# Patient Record
Sex: Female | Born: 1985
Health system: Southern US, Community
[De-identification: ages and names within clinical notes are randomized; demographics above are authoritative.]

## PROBLEM LIST (undated history)

## (undated) ENCOUNTER — Inpatient Hospital Stay (HOSPITAL_COMMUNITY): Payer: Self-pay

## (undated) DIAGNOSIS — N926 Irregular menstruation, unspecified: Secondary | ICD-10-CM

## (undated) DIAGNOSIS — E785 Hyperlipidemia, unspecified: Secondary | ICD-10-CM

## (undated) DIAGNOSIS — I499 Cardiac arrhythmia, unspecified: Secondary | ICD-10-CM

## (undated) DIAGNOSIS — E119 Type 2 diabetes mellitus without complications: Secondary | ICD-10-CM

## (undated) DIAGNOSIS — N611 Abscess of the breast and nipple: Secondary | ICD-10-CM

## (undated) HISTORY — DX: Abscess of the breast and nipple: N61.1

## (undated) HISTORY — DX: Type 2 diabetes mellitus without complications: E11.9

## (undated) HISTORY — DX: Cardiac arrhythmia, unspecified: I49.9

## (undated) HISTORY — PX: SURGERY OF LIP: SUR1315

## (undated) HISTORY — PX: BREAST SURGERY: SHX581

## (undated) HISTORY — DX: Hyperlipidemia, unspecified: E78.5

## (undated) HISTORY — DX: Irregular menstruation, unspecified: N92.6

---
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
## 2006-09-15 ENCOUNTER — Other Ambulatory Visit: Admission: RE | Admit: 2006-09-15 | Discharge: 2006-09-15 | Payer: Self-pay | Admitting: Unknown Physician Specialty

## 2006-09-15 ENCOUNTER — Encounter (INDEPENDENT_AMBULATORY_CARE_PROVIDER_SITE_OTHER): Payer: Self-pay | Admitting: Unknown Physician Specialty

## 2007-12-28 ENCOUNTER — Other Ambulatory Visit: Admission: RE | Admit: 2007-12-28 | Discharge: 2007-12-28 | Payer: Self-pay | Admitting: Nurse Practitioner

## 2007-12-28 ENCOUNTER — Other Ambulatory Visit: Admission: RE | Admit: 2007-12-28 | Discharge: 2007-12-28 | Payer: Self-pay | Admitting: Family Medicine

## 2008-11-28 ENCOUNTER — Encounter (INDEPENDENT_AMBULATORY_CARE_PROVIDER_SITE_OTHER): Payer: Self-pay | Admitting: Unknown Physician Specialty

## 2008-11-28 ENCOUNTER — Other Ambulatory Visit: Admission: RE | Admit: 2008-11-28 | Discharge: 2008-11-28 | Payer: Self-pay | Admitting: Unknown Physician Specialty

## 2012-08-27 ENCOUNTER — Emergency Department (HOSPITAL_COMMUNITY): Payer: No Typology Code available for payment source

## 2012-08-27 ENCOUNTER — Encounter (HOSPITAL_COMMUNITY): Payer: Self-pay | Admitting: Emergency Medicine

## 2012-08-27 ENCOUNTER — Emergency Department (HOSPITAL_COMMUNITY)
Admission: EM | Admit: 2012-08-27 | Discharge: 2012-08-27 | Disposition: A | Discharge status: Home / Self Care | Payer: No Typology Code available for payment source | Attending: Emergency Medicine | Admitting: Emergency Medicine

## 2012-08-27 DIAGNOSIS — S335XXA Sprain of ligaments of lumbar spine, initial encounter: Secondary | ICD-10-CM | POA: Insufficient documentation

## 2012-08-27 DIAGNOSIS — S139XXA Sprain of joints and ligaments of unspecified parts of neck, initial encounter: Secondary | ICD-10-CM | POA: Insufficient documentation

## 2012-08-27 DIAGNOSIS — Y9389 Activity, other specified: Secondary | ICD-10-CM | POA: Insufficient documentation

## 2012-08-27 DIAGNOSIS — S39012A Strain of muscle, fascia and tendon of lower back, initial encounter: Secondary | ICD-10-CM

## 2012-08-27 DIAGNOSIS — Y92410 Unspecified street and highway as the place of occurrence of the external cause: Secondary | ICD-10-CM | POA: Insufficient documentation

## 2012-08-27 DIAGNOSIS — S161XXA Strain of muscle, fascia and tendon at neck level, initial encounter: Secondary | ICD-10-CM

## 2012-08-27 MED ORDER — NAPROXEN 500 MG PO TABS: ORAL_TABLET | ORAL | Status: DC
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
## 2013-07-04 ENCOUNTER — Ambulatory Visit (INDEPENDENT_AMBULATORY_CARE_PROVIDER_SITE_OTHER): Payer: BC Managed Care – PPO | Admitting: Adult Health

## 2013-07-04 ENCOUNTER — Encounter: Payer: Self-pay | Admitting: Adult Health

## 2013-07-04 VITALS — BP 120/72 | Ht 62.0 in | Wt 186.5 lb

## 2013-07-04 DIAGNOSIS — L02229 Furuncle of trunk, unspecified: Secondary | ICD-10-CM

## 2013-07-04 DIAGNOSIS — N611 Abscess of the breast and nipple: Secondary | ICD-10-CM

## 2013-07-04 HISTORY — DX: Abscess of the breast and nipple: N61.1

## 2013-07-04 MED ORDER — SULFAMETHOXAZOLE-TMP DS 800-160 MG PO TABS: 1.0000 | ORAL_TABLET | Freq: Two times a day (BID) | ORAL | Status: DC

## 2013-07-04 NOTE — Progress Notes (Signed)
Subjective:     Patient ID: Jessica Carey, female   DOB: 09/27/1985, 28 y.o.   MRN: [id number]  HPI Latrell is a 28 year old Hispanic female in complaining of knot under right breast x 2 weeks, used ice is better, she says she gets boils often in different areas.Has history of irregular periods, used to take OCs to control but off those about 2 years, was seen at health dept in past.  Review of Systems See HPI Reviewed past medical,surgical, social and family history. Reviewed medications and allergies.     Objective:   Physical Exam BP 120/72  Ht 5\' 2"  (1.575 m)  Wt 186 lb 8 oz (84.596 kg)  BMI 34.10 kg/m2  LMP 02/03/2013    Skin warm and dry,  Breasts:no dominate palpable mass, retraction or nipple discharge, has thickness and tenderness at about 5 0' clock near sternum, no redness today, has skin darkening and thickness between breast  Assessment:     Boil right breast    Plan:     Return in 2 weeks for pap and physical and recheck breast Rx septra ds 1 bid x 10 days Review handout on boils

## 2013-07-04 NOTE — Patient Instructions (Signed)
Abscess An abscess is an infected area that contains a collection of pus and debris.It can occur in almost any part of the body. An abscess is also known as a furuncle or boil. CAUSES  An abscess occurs when tissue gets infected. This can occur from blockage of oil or sweat glands, infection of hair follicles, or a minor injury to the skin. As the body tries to fight the infection, pus collects in the area and creates pressure under the skin. This pressure causes pain. People with weakened immune systems have difficulty fighting infections and get certain abscesses more often.  SYMPTOMS Usually an abscess develops on the skin and becomes a painful mass that is red, warm, and tender. If the abscess forms under the skin, you may feel a moveable soft area under the skin. Some abscesses break open (rupture) on their own, but most will continue to get worse without care. The infection can spread deeper into the body and eventually into the bloodstream, causing you to feel ill.  DIAGNOSIS  Your caregiver will take your medical history and perform a physical exam. A sample of fluid may also be taken from the abscess to determine what is causing your infection. TREATMENT  Your caregiver may prescribe antibiotic medicines to fight the infection. However, taking antibiotics alone usually does not cure an abscess. Your caregiver may need to make a small cut (incision) in the abscess to drain the pus. In some cases, gauze is packed into the abscess to reduce pain and to continue draining the area. HOME CARE INSTRUCTIONS   Only take over-the-counter or prescription medicines for pain, discomfort, or fever as directed by your caregiver.  If you were prescribed antibiotics, take them as directed. Finish them even if you start to feel better.  If gauze is used, follow your caregiver's directions for changing the gauze.  To avoid spreading the infection:  Keep your draining abscess covered with a  bandage.  Wash your hands well.  Do not share personal care items, towels, or whirlpools with others.  Avoid skin contact with others.  Keep your skin and clothes clean around the abscess.  Keep all follow-up appointments as directed by your caregiver. SEEK MEDICAL CARE IF:   You have increased pain, swelling, redness, fluid drainage, or bleeding.  You have muscle aches, chills, or a general ill feeling.  You have a fever. MAKE SURE YOU:   Understand these instructions.  Will watch your condition.  Will get help right away if you are not doing well or get worse. Document Released: 10/30/2004 Document Revised: 07/22/2011 Document Reviewed: 04/04/2011 [facility] Patient Information 2014 [place], [place]. Take septra ds Follow up in 2 weeks for pap and physical

## 2013-07-18 ENCOUNTER — Encounter: Payer: Self-pay | Admitting: Adult Health

## 2013-07-18 ENCOUNTER — Ambulatory Visit (INDEPENDENT_AMBULATORY_CARE_PROVIDER_SITE_OTHER): Payer: BC Managed Care – PPO | Admitting: Adult Health

## 2013-07-18 ENCOUNTER — Other Ambulatory Visit (HOSPITAL_COMMUNITY)
Admission: RE | Admit: 2013-07-18 | Discharge: 2013-07-18 | Disposition: A | Discharge status: Home / Self Care | Payer: BC Managed Care – PPO | Source: Ambulatory Visit | Attending: Obstetrics and Gynecology | Admitting: Obstetrics and Gynecology

## 2013-07-18 ENCOUNTER — Other Ambulatory Visit: Payer: Self-pay | Admitting: Adult Health

## 2013-07-18 VITALS — BP 96/64 | HR 76 | Ht 63.0 in | Wt 186.0 lb

## 2013-07-18 DIAGNOSIS — Z01419 Encounter for gynecological examination (general) (routine) without abnormal findings: Secondary | ICD-10-CM

## 2013-07-18 LAB — CBC
HCT: 43.1 % (ref 36.0–46.0)
Hemoglobin: 14.6 g/dL (ref 12.0–15.0)
MCH: 28.7 pg (ref 26.0–34.0)
MCHC: 33.9 g/dL (ref 30.0–36.0)
MCV: 84.7 fL (ref 78.0–100.0)
Platelets: 264 10*3/uL (ref 150–400)
RBC: 5.09 MIL/uL (ref 3.87–5.11)
RDW: 13.8 % (ref 11.5–15.5)
WBC: 8.2 10*3/uL (ref 4.0–10.5)

## 2013-07-18 NOTE — Patient Instructions (Signed)
Finish septra ds No more under wire Return in 2 weeks

## 2013-07-18 NOTE — Progress Notes (Signed)
Patient ID: Jessica Carey, female   DOB: 1986-01-11, 28 y.o.   MRN: [id number] History of Present Illness:  Jessica Carey is a 28 year old Hispanic female in for pap and physical and check breast had boil.  Current Medications, Allergies, Past Medical History, Past Surgical History, Family History and Social History were reviewed in [facility] electronic medical record.     Review of Systems:  Patient denies any headaches, blurred vision, shortness of breath, chest pain, abdominal pain, problems with bowel movements, urination, or intercourse. No joint pain or mood swings.   Physical Exam:BP 96/64  Pulse 76  Ht 5\' 3"  (1.6 m)  Wt 186 lb (84.369 kg)  BMI 32.96 kg/m2  LMP 07/16/2013 General:  Well developed, well nourished, no acute distress Skin:  Warm and dry Neck:  Midline trachea, normal thyroid Lungs; Clear to auscultation bilaterally Breast:  No dominant palpable mass, retraction, or nipple discharge, has thickness,ridge, but no boil Cardiovascular: Regular rate and rhythm Abdomen:  Soft, non tender, no hepatosplenomegaly Pelvic:  External genitalia is normal in appearance.  The vagina is normal in appearance.  The cervix is smooth, pap performed.  Uterus is felt to be normal size, shape, and contour.  No  adnexal masses or tenderness noted. Extremities:  No swelling or varicosities noted Psych:  No mood changes, alert and cooperative, seems happy   Impression: Yearly gyn exam    Plan: Physical in 1 year Check CBC,CMP,TSH and lipids Follow up in 2 weeks after finishing septra  No under wire bras

## 2013-07-19 LAB — LIPID PANEL
Cholesterol: 187 mg/dL (ref 0–200)
HDL: 35 mg/dL — ABNORMAL LOW
LDL Cholesterol: 113 mg/dL — ABNORMAL HIGH (ref 0–99)
Total CHOL/HDL Ratio: 5.3 ratio
Triglycerides: 194 mg/dL — ABNORMAL HIGH
VLDL: 39 mg/dL (ref 0–40)

## 2013-07-19 LAB — COMPREHENSIVE METABOLIC PANEL
ALT: 39 U/L — ABNORMAL HIGH (ref 0–35)
AST: 27 U/L (ref 0–37)
Albumin: 4.2 g/dL (ref 3.5–5.2)
Alkaline Phosphatase: 72 U/L (ref 39–117)
BUN: 13 mg/dL (ref 6–23)
CO2: 27 mEq/L (ref 19–32)
Calcium: 9.7 mg/dL (ref 8.4–10.5)
Chloride: 101 mEq/L (ref 96–112)
Creat: 0.72 mg/dL (ref 0.50–1.10)
Glucose, Bld: 136 mg/dL — ABNORMAL HIGH (ref 70–99)
Potassium: 4.9 mEq/L (ref 3.5–5.3)
Sodium: 137 mEq/L (ref 135–145)
Total Bilirubin: 0.4 mg/dL (ref 0.2–1.2)
Total Protein: 7.7 g/dL (ref 6.0–8.3)

## 2013-07-19 LAB — TSH: TSH: 1.979 u[IU]/mL (ref 0.350–4.500)

## 2013-07-20 LAB — CYTOLOGY - PAP

## 2013-07-20 LAB — HEMOGLOBIN A1C
Hgb A1c MFr Bld: 6.8 % — ABNORMAL HIGH (ref <5.7)
Mean Plasma Glucose: 148 mg/dL — ABNORMAL HIGH (ref <117)

## 2013-07-21 ENCOUNTER — Telehealth: Payer: Self-pay | Admitting: Adult Health

## 2013-07-21 NOTE — Telephone Encounter (Signed)
No voice mail.

## 2013-08-01 ENCOUNTER — Ambulatory Visit: Payer: BC Managed Care – PPO | Admitting: Adult Health

## 2013-08-01 ENCOUNTER — Telehealth: Payer: Self-pay | Admitting: Adult Health

## 2013-08-01 ENCOUNTER — Encounter: Payer: Self-pay | Admitting: *Deleted

## 2013-08-01 NOTE — Telephone Encounter (Signed)
Number not working and she no showed appt today

## 2013-08-04 ENCOUNTER — Encounter: Payer: Self-pay | Admitting: Adult Health

## 2013-08-04 ENCOUNTER — Ambulatory Visit (INDEPENDENT_AMBULATORY_CARE_PROVIDER_SITE_OTHER): Payer: BC Managed Care – PPO | Admitting: Adult Health

## 2013-08-04 VITALS — BP 100/78 | Ht 66.0 in | Wt 184.5 lb

## 2013-08-04 DIAGNOSIS — L02229 Furuncle of trunk, unspecified: Secondary | ICD-10-CM

## 2013-08-04 DIAGNOSIS — E1165 Type 2 diabetes mellitus with hyperglycemia: Secondary | ICD-10-CM | POA: Insufficient documentation

## 2013-08-04 DIAGNOSIS — N611 Abscess of the breast and nipple: Secondary | ICD-10-CM

## 2013-08-04 DIAGNOSIS — O24119 Pre-existing diabetes mellitus, type 2, in pregnancy, unspecified trimester: Secondary | ICD-10-CM | POA: Insufficient documentation

## 2013-08-04 DIAGNOSIS — L02239 Carbuncle of trunk, unspecified: Secondary | ICD-10-CM

## 2013-08-04 DIAGNOSIS — E119 Type 2 diabetes mellitus without complications: Secondary | ICD-10-CM

## 2013-08-04 HISTORY — DX: Type 2 diabetes mellitus without complications: E11.9

## 2013-08-04 MED ORDER — METFORMIN HCL 500 MG PO TABS: 500.0000 mg | ORAL_TABLET | Freq: Two times a day (BID) | ORAL | Status: DC

## 2013-08-04 NOTE — Progress Notes (Signed)
Subjective:     Patient ID: Jessica Carey, female   DOB: 1985/05/01, 28 y.o.   MRN: [id number]  HPI Jessica Carey is a [age] year old Hispanic female in for follow up of boil on breast which has resolved but she can feel ?lump.  Review of Systems See HPI Reviewed past medical,surgical, social and family history. Reviewed medications and allergies.     Objective:   Physical Exam BP 100/78  Ht 5\' 6"  (1.676 m)  Wt 184 lb 8 oz (83.689 kg)  BMI 29.79 kg/m2  LMP 07/16/2013     Skin warm and dry,  Breasts:no dominate palpable mass, retraction or nipple discharge, but has thickness like scar tissue where boil was right breast. Labs are back and reviewed with pt, A1c 6.8 and TC 187,HDL 35,LDL 113 and trig 194.discussed that she is diabetic and needs to start meds and diet modification and exercise ,[facility]  Assessment:     Diabetes Resolved boil on breast    Plan:     Rx metformin 500 mg 1 bid #60 with 11 refills Follow up in 4 weeks Refer to dietician Review handouts on diabetes

## 2013-08-04 NOTE — Patient Instructions (Signed)
How to Avoid Diabetes Problems You can do a lot to prevent or slow down diabetes problems. Following your diabetes plan and taking care of yourself can reduce your risk of serious or life-threatening complications. Below, you will find certain things you can do to prevent diabetes problems. MANAGE YOUR DIABETES Follow your caregiver's, nurse educator's, and dietitian's instructions for managing your diabetes. They will teach you the basics of diabetes care. They can help answer questions you may have. Learn about diabetes and make healthy choices regarding eating and physical activity. Monitor your blood glucose level regularly. Your caregiver will help you decide how often to check your blood glucose level depending on your treatment goals and how well you are meeting them.  DO NOT SMOKE Smoking and diabetes are a dangerous combination. Smoking raises your risk for diabetes problems. If you quit smoking, you will lower your risk for heart attack, stroke, nerve disease, and kidney disease. Your cholesterol and your blood pressure levels may improve. Your blood circulation will also improve. If you smoke, ask your caregiver for help in quitting. KEEP YOUR BLOOD PRESSURE UNDER CONTROL Keeping your blood pressure under control will help prevent damage to your eyes, kidneys, heart, and blood vessels. Blood pressure consists of two numbers. The top number should be below 120, and the bottom number should be below 80 (120/80). Keep your blood pressure as close to these numbers as you can. If you already have kidney disease, you may want even lower blood pressure to protect your kidneys. Talk to your caregiver to make sure that your blood pressure goal is right for your needs. Meal planning, medicines, and exercise can help you reach your blood pressure target. Have your blood pressure checked at every visit with your caregiver. KEEP YOUR CHOLESTEROL UNDER CONTROL Normal cholesterol levels will help prevent heart  disease and stroke. These are the biggest health problems for people with diabetes. Keeping cholesterol levels under control can also help with blood flow. Have your cholesterol level checked at least once a year. Meal planning, exercise, and medicines can help you reach your cholesterol targets. SCHEDULE AND KEEP YOUR ANNUAL PHYSICAL EXAMS AND EYE EXAMS Your caregiver will tell you how often he or she wants to see you depending on your plan of treatment. It is important that you keep these appointments so that possible problems can be identified early and complications can be avoided or treated.  Every visit with your caregiver should include your weight, blood pressure, and an evaluation of your blood glucose control.  Your hemoglobin A1c should be checked:  At least twice a year if you are at your goal.  Every 3 months if there are changes in treatment.  If you are not meeting your goals.  Your blood lipids should be checked yearly. You should also be checked yearly to see if you have protein in your urine (microalbumin).  Schedule a dilated eye exam if you have type 1 diabetes within 5 years of your diagnosis and then yearly. Schedule a dilated eye exam if you have type 2 diabetes at diagnosis and then yearly. All exams thereafter can be extended to every 2 to 3 years if one or more exams have been normal. KEEP YOUR VACCINES CURRENT The flu vaccine is recommended yearly. The formula for the vaccine changes every year and needs to be updated for the best protection against current viruses. In addition, you should get a vaccination against pneumonia at least once in your life. However, there are some  instances where another vaccine is recommended. Check with your caregiver. TAKE CARE OF YOUR FEET  Diabetes may cause you to have a poor blood supply (circulation) to your legs and feet. Because of this, the skin may be thinner, break easier, and heal more slowly. You also may have nerve damage in  your legs and feet causing decreased feeling. You may not notice minor injuries to your feet that could lead to serious problems or infections. Taking care of your feet is very important. Visual foot exams are performed at every routine medical visit. The exams check for cuts, injuries, or other problems with the feet. A comprehensive foot exam should be done yearly. This includes visual inspection as well as assessing foot pulses and testing for loss of sensation. You should also do the following:  Inspect your feet daily for cuts, calluses, blisters, ingrown toenails, and signs of infection, such as redness, swelling, or pus.  Wash and dry your feet thoroughly, especially between the toes.  Avoid soaking your feet regularly in hot water baths.  Moisturize dry skin with lotion, avoiding areas between your toes.  Cut toenails straight across and file the edges.  Avoid shoes that do not fit well or have areas that irritate your skin.  Avoid going barefooted or wearing only socks. Your feet need protection. TAKE CARE OF YOUR TEETH People with poorly controlled diabetes are more likely to have gum (periodontal) disease. These infections make diabetes harder to control. Periodontal diseases, if left untreated, can lead to tooth loss. Brush your teeth twice a day, floss, and see your dentist for checkups and cleaning every 6 months, or 2 times a year. ASK YOUR CAREGIVER ABOUT TAKING ASPIRIN Taking aspirin daily is recommended to help prevent cardiovascular disease in people with and without diabetes. Ask your caregiver if this would benefit you and what dose he or she would recommend. DRINK RESPONSIBLY Moderate amounts of alcohol (less than 1 drink per day for adult women and less than 2 drinks per day for adult men) have a minimal effect on blood glucose if ingested with food. It is important to eat food with alcohol to avoid hypoglycemia. People should avoid alcohol if they have a history of  alcohol abuse or dependence, if they are pregnant, and if they have liver disease, pancreatitis, advanced neuropathy, or severe hypertriglyceridemia. LESSEN STRESS Living with diabetes can be stressful. When you are under stress, your blood glucose may be affected in two ways:  Stress hormones may cause your blood glucose to rise.  You may be distracted from taking good care of yourself. It is a good idea to be aware of your stress level and make changes that are necessary to help you better manage challenging situations. Support groups, planned relaxation, a hobby you enjoy, meditation, healthy relationships, and exercise all work to lower your stress level. If your efforts do not seem to be helping, get help from your caregiver or a trained mental health professional. Document Released: 10/08/2010 Document Revised: 01/07/2012 Document Reviewed: 10/08/2010 [facility] Patient Information 2015 [place], [place]. This information is not intended to replace advice given to you by your health care provider. Make sure you discuss any questions you have with your health care provider. Type 2 Diabetes Mellitus, Adult Type 2 diabetes mellitus, often simply referred to as type 2 diabetes, is a long-lasting (chronic) disease. In type 2 diabetes, the pancreas does not make enough insulin (a hormone), the cells are less responsive to the insulin that is made (insulin  resistance), or both. Normally, insulin moves sugars from food into the tissue cells. The tissue cells use the sugars for energy. The lack of insulin or the lack of normal response to insulin causes excess sugars to build up in the blood instead of going into the tissue cells. As a result, high blood sugar (hyperglycemia) develops. The effect of high sugar (glucose) levels can cause many complications. Type 2 diabetes was also previously called adult-onset diabetes but it can occur at any age.  RISK FACTORS  A person is predisposed to developing type 2  diabetes if someone in the family has the disease and also has one or more of the following primary risk factors:  Overweight.  An inactive lifestyle.  A history of consistently eating high-calorie foods. Maintaining a normal weight and regular physical activity can reduce the chance of developing type 2 diabetes. SYMPTOMS  A person with type 2 diabetes may not show symptoms initially. The symptoms of type 2 diabetes appear slowly. The symptoms include:  Increased thirst (polydipsia).  Increased urination (polyuria).  Increased urination during the night (nocturia).  Weight loss. This weight loss may be rapid.  Frequent, recurring infections.  Tiredness (fatigue).  Weakness.  Vision changes, such as blurred vision.  Fruity smell to your breath.  Abdominal pain.  Nausea or vomiting.  Cuts or bruises which are slow to heal.  Tingling or numbness in the hands or feet. DIAGNOSIS Type 2 diabetes is frequently not diagnosed until complications of diabetes are present. Type 2 diabetes is diagnosed when symptoms or complications are present and when blood glucose levels are increased. Your blood glucose level may be checked by one or more of the following blood tests:  A fasting blood glucose test. You will not be allowed to eat for at least 8 hours before a blood sample is taken.  A random blood glucose test. Your blood glucose is checked at any time of the day regardless of when you ate.  A hemoglobin A1c blood glucose test. A hemoglobin A1c test provides information about blood glucose control over the previous 3 months.  An oral glucose tolerance test (OGTT). Your blood glucose is measured after you have not eaten (fasted) for 2 hours and then after you drink a glucose-containing beverage. TREATMENT   You may need to take insulin or diabetes medicine daily to keep blood glucose levels in the desired range.  If you use insulin, you may need to adjust the dosage depending  on the carbohydrates that you eat with each meal or snack. The treatment goal is to maintain the before meal blood sugar (preprandial glucose) level at 70-130 mg/dL. HOME CARE INSTRUCTIONS   Have your hemoglobin A1c level checked twice a year.  Perform daily blood glucose monitoring as directed by your health care provider.  Monitor urine ketones when you are ill and as directed by your health care provider.  Take your diabetes medicine or insulin as directed by your health care provider to maintain your blood glucose levels in the desired range.  Never run out of diabetes medicine or insulin. It is needed every day.  If you are using insulin, you may need to adjust the amount of insulin given based on your intake of carbohydrates. Carbohydrates can raise blood glucose levels but need to be included in your diet. Carbohydrates provide vitamins, minerals, and fiber which are an essential part of a healthy diet. Carbohydrates are found in fruits, vegetables, whole grains, dairy products, legumes, and foods containing added  sugars.  Eat healthy foods. You should make an appointment to see a registered dietitian to help you create an eating plan that is right for you.  Lose weight if overweight.  Carry a medical alert card or wear your medical alert jewelry.  Carry a 15 gram carbohydrate snack with you at all times to treat low blood glucose (hypoglycemia). Some examples of 15 gram carbohydrate snacks include:  Glucose tablets, 3 or 4  Raisins, 2 tablespoons (24 grams)  Jelly beans, 6  Animal crackers, 8  Regular pop, 4 ounces (120 mL)  Gummy treats, 9  Recognize hypoglycemia. Hypoglycemia occurs with blood glucose levels of 70 mg/dL and below. The risk for hypoglycemia increases when fasting or skipping meals, during or after intense exercise, and during sleep. Hypoglycemia symptoms can include:  Tremors or shakes.  Decreased ability to concentrate.  Sweating.  Increased  heart rate.  Headache.  Dry mouth.  Hunger.  Irritability.  Anxiety.  Restless sleep.  Altered speech or coordination.  Confusion.  Treat hypoglycemia promptly. If you are alert and able to safely swallow, follow the 15:15 rule:  Take 15-20 grams of rapid-acting glucose or carbohydrate. Rapid-acting options include glucose gel, glucose tablets, or 4 ounces (120 mL) of fruit juice, regular soda, or low fat milk.  Check your blood glucose level 15 minutes after taking the glucose.  Take 15-20 grams more of glucose if the repeat blood glucose level is still 70 mg/dL or below.  Eat a meal or snack within 1 hour once blood glucose levels return to normal.  Be alert to feeling very thirsty and urinating more frequently than usual, which are early signs of hyperglycemia. An early awareness of hyperglycemia allows for prompt treatment. Treat hyperglycemia as directed by your health care provider.  Engage in at least 150 minutes of moderate-intensity physical activity a week, spread over at least 3 days of the week or as directed by your health care provider. In addition, you should engage in resistance exercise at least 2 times a week or as directed by your health care provider.  Adjust your medicine and food intake as needed if you start a new exercise or sport.  Follow your sick day plan at any time you are unable to eat or drink as usual.  Avoid tobacco use.  Limit alcohol intake to no more than 1 drink per day for nonpregnant women and 2 drinks per day for men. You should drink alcohol only when you are also eating food. Talk with your health care provider whether alcohol is safe for you. Tell your health care provider if you drink alcohol several times a week.  Follow up with your health care provider regularly.  Schedule an eye exam soon after the diagnosis of type 2 diabetes and then annually.  Perform daily skin and foot care. Examine your skin and feet daily for cuts,  bruises, redness, nail problems, bleeding, blisters, or sores. A foot exam by a health care provider should be done annually.  Brush your teeth and gums at least twice a day and floss at least once a day. Follow up with your dentist regularly.  Share your diabetes management plan with your workplace or school.  Stay up-to-date with immunizations.  Learn to manage stress.  Obtain ongoing diabetes education and support as needed.  Participate in, or seek rehabilitation as needed to maintain or improve independence and quality of life. Request a physical or occupational therapy referral if you are having foot or hand  numbness or difficulties with grooming, dressing, eating, or physical activity. SEEK MEDICAL CARE IF:   You are unable to eat food or drink fluids for more than 6 hours.  You have nausea and vomiting for more than 6 hours.  Your blood glucose level is over 240 mg/dL.  There is a change in mental status.  You develop an additional serious illness.  You have diarrhea for more than 6 hours.  You have been sick or have had a fever for a couple of days and are not getting better.  You have pain during any physical activity.  SEEK IMMEDIATE MEDICAL CARE IF:  You have difficulty breathing.  You have moderate to large ketone levels. MAKE SURE YOU:  Understand these instructions.  Will watch your condition.  Will get help right away if you are not doing well or get worse. Document Released: 01/20/2005 Document Revised: 01/25/2013 Document Reviewed: 08/19/2011 [facility] Patient Information 2015 [place]. This information is not intended to replace advice given to you by your health care provider. Make sure you discuss any questions you have with your health care provider. Follow up in 4 weeks

## 2013-08-09 ENCOUNTER — Telehealth (HOSPITAL_COMMUNITY): Payer: Self-pay | Admitting: Dietician

## 2013-08-09 NOTE — Telephone Encounter (Signed)
Called at 1535. Unable to leave voicemail. Message reports voicemail has not been set up yet. Sent letter to pt home via [employer] Mail in attempt to contact pt to schedule appointment.

## 2013-08-09 NOTE — Telephone Encounter (Signed)
[facility]

## 2013-08-15 NOTE — Telephone Encounter (Signed)
No response to contact attempts. Referral filed.

## 2013-09-01 ENCOUNTER — Ambulatory Visit: Payer: BC Managed Care – PPO | Admitting: Adult Health

## 2013-11-02 ENCOUNTER — Emergency Department (HOSPITAL_COMMUNITY)
Admission: EM | Admit: 2013-11-02 | Discharge: 2013-11-02 | Disposition: A | Discharge status: Home / Self Care | Payer: BC Managed Care – PPO | Attending: Emergency Medicine | Admitting: Emergency Medicine

## 2013-11-02 ENCOUNTER — Telehealth: Payer: Self-pay | Admitting: *Deleted

## 2013-11-02 ENCOUNTER — Other Ambulatory Visit: Payer: Self-pay | Admitting: Emergency Medicine

## 2013-11-02 ENCOUNTER — Encounter (HOSPITAL_COMMUNITY): Payer: Self-pay | Admitting: Emergency Medicine

## 2013-11-02 DIAGNOSIS — R509 Fever, unspecified: Secondary | ICD-10-CM | POA: Diagnosis not present

## 2013-11-02 DIAGNOSIS — R1110 Vomiting, unspecified: Secondary | ICD-10-CM | POA: Diagnosis not present

## 2013-11-02 DIAGNOSIS — Z79899 Other long term (current) drug therapy: Secondary | ICD-10-CM | POA: Insufficient documentation

## 2013-11-02 DIAGNOSIS — N61 Mastitis without abscess: Secondary | ICD-10-CM | POA: Diagnosis not present

## 2013-11-02 DIAGNOSIS — E119 Type 2 diabetes mellitus without complications: Secondary | ICD-10-CM | POA: Diagnosis not present

## 2013-11-02 DIAGNOSIS — N611 Abscess of the breast and nipple: Secondary | ICD-10-CM

## 2013-11-02 MED ORDER — HYDROCODONE-ACETAMINOPHEN 5-325 MG PO TABS: 1.0000 | ORAL_TABLET | ORAL | Status: DC | PRN

## 2013-11-02 MED ORDER — DOXYCYCLINE HYCLATE 100 MG PO CAPS: 100.0000 mg | ORAL_CAPSULE | Freq: Two times a day (BID) | ORAL | Status: DC

## 2013-11-02 NOTE — ED Notes (Signed)
Pt c/o abscess to right breast x 5 days.

## 2013-11-02 NOTE — Discharge Instructions (Signed)
[facility] your abscess. Please use doxycycline daily until all taken. Use tylenol for mild pain. Use norco for more severe pain. Abscess An abscess (boil or furuncle) is an infected area on or under the skin. This area is filled with yellowish-white fluid (pus) and other material (debris). HOME CARE   Only take medicines as told by your doctor.  If you were given antibiotic medicine, take it as directed. Finish the medicine even if you start to feel better.  If gauze is used, follow your doctor's directions for changing the gauze.  To avoid spreading the infection:  Keep your abscess covered with a bandage.  Wash your hands well.  Do not share personal care items, towels, or whirlpools with others.  Avoid skin contact with others.  Keep your skin and clothes clean around the abscess.  Keep all doctor visits as told. GET HELP RIGHT AWAY IF:   You have more pain, puffiness (swelling), or redness in the wound site.  You have more fluid or blood coming from the wound site.  You have muscle aches, chills, or you feel sick.  You have a fever. MAKE SURE YOU:   Understand these instructions.  Will watch your condition.  Will get help right away if you are not doing well or get worse. Document Released: 07/09/2007 Document Revised: 07/22/2011 Document Reviewed: 04/04/2011 [facility] Patient Information 2015 [place]. This information is not intended to replace advice given to you by your health care provider. Make sure you discuss any questions you have with your health care provider.

## 2013-11-02 NOTE — ED Provider Notes (Signed)
CSN: [id number]     Arrival date & time 11/02/13  1016 History   First MD Initiated Contact with Patient 11/02/13 1056     Chief Complaint  Patient presents with  . Abscess     (Consider location/radiation/quality/duration/timing/severity/associated sxs/prior Treatment) Patient is a 28 y.o. female presenting with abscess. The history is provided by the patient.  Abscess Location:  Torso Torso abscess location:  R chest Abscess quality: painful, redness and warmth   Abscess quality: not draining   Duration:  5 days Progression:  Worsening Pain details:    Quality:  Aching and throbbing   Severity:  Moderate   Duration:  5 days   Timing:  Intermittent   Progression:  Worsening Chronicity:  New Context: diabetes   Relieved by:  Nothing Worsened by:  Nothing tried Ineffective treatments:  None tried Associated symptoms: fever and vomiting   Risk factors: no hx of MRSA     Past Medical History  Diagnosis Date  . Boil, breast 07/04/2013  . Irregular periods   . Diabetes mellitus without complication   . Diabetes 08/04/2013   History reviewed. No pertinent past surgical history. Family History  Problem Relation Age of Onset  . Diabetes Mother   . Diabetes Father   . Hyperlipidemia Father   . Diabetes Sister   . Cancer Maternal Aunt     liver  . Diabetes Paternal Grandmother    History  Substance Use Topics  . Smoking status: Never Smoker   . Smokeless tobacco: Never Used  . Alcohol Use: Yes     Comment: occ.   OB History   Grav Para Term Preterm Abortions TAB SAB Ect Mult Living                 Review of Systems  Constitutional: Positive for fever. Negative for activity change.       All ROS Neg except as noted in HPI  HENT: Negative for nosebleeds.   Eyes: Negative for photophobia and discharge.  Respiratory: Negative for cough, shortness of breath and wheezing.   Cardiovascular: Negative for chest pain and palpitations.  Gastrointestinal: Positive for  vomiting. Negative for abdominal pain and blood in stool.  Genitourinary: Negative for dysuria, frequency and hematuria.  Musculoskeletal: Negative for arthralgias, back pain and neck pain.  Skin: Negative.   Neurological: Negative for dizziness, seizures and speech difficulty.  Psychiatric/Behavioral: Negative for hallucinations and confusion.      Allergies  Review of patient's allergies indicates no known allergies.  Home Medications   Prior to Admission medications   Medication Sig Start Date End Date Taking? Authorizing Provider  metFORMIN (GLUCOPHAGE) 500 MG tablet Take 1 tablet (500 mg total) by mouth 2 (two) times daily with a meal. 08/04/13  Yes Adline PotterJennifer A Griffin, NP   BP 114/74  Pulse 76  Temp(Src) 99.6 F (37.6 C)  Resp 18  Ht 5\' 4"  (1.626 m)  Wt 190 lb (86.183 kg)  BMI 32.60 kg/m2  SpO2 100%  LMP 10/29/2013 Physical Exam  Nursing note and vitals reviewed. Constitutional: She is oriented to person, place, and time. She appears well-developed and well-nourished.  Non-toxic appearance.  HENT:  Head: Normocephalic.  Right Ear: Tympanic membrane and external ear normal.  Left Ear: Tympanic membrane and external ear normal.  Eyes: EOM and lids are normal. Pupils are equal, round, and reactive to light.  Neck: Normal range of motion. Neck supple. Carotid bruit is not present.  Cardiovascular: Normal rate, regular rhythm, normal heart  sounds, intact distal pulses and normal pulses.   Pulmonary/Chest: Breath sounds normal. No respiratory distress.    There is a 5x5 cm red, raised, warm, abscess area he at the upper tail of the right breasts, extending into the tissue of the sternum. The area is tender to touch and warm, but not hot. There is no red streaking appreciated. There is no drainage from the nipple area. There no lesions noted on the left breast.  Abdominal: Soft. Bowel sounds are normal. There is no tenderness. There is no guarding.  Musculoskeletal: Normal  range of motion.  Lymphadenopathy:       Head (right side): No submandibular adenopathy present.       Head (left side): No submandibular adenopathy present.    She has no cervical adenopathy.  Neurological: She is alert and oriented to person, place, and time. She has normal strength. No cranial nerve deficit or sensory deficit.  Skin: Skin is warm and dry.  Psychiatric: She has a normal mood and affect. Her speech is normal.    ED Course  Procedures (including critical care time) Labs Review Labs Reviewed - No data to display  Imaging Review No results found.   EKG Interpretation None      MDM  Temperature 99.6, vital signs otherwise within normal limits. Pulse oximetry is 100% on room air. Within normal limits by my interpretation.  Case reviewed by Dr Jeraldine Loots. Breast imaging center called. They can see pt in their office for the procedure. Rx for norco and doxycycline given to the patient.   Final diagnoses:  Breast abscess of female    *I have reviewed nursing notes, vital signs, and all appropriate lab and imaging results for this patient.Kathie Dike, PA-C 11/02/13 [phone]

## 2013-11-02 NOTE — ED Notes (Signed)
PA at bedside.

## 2013-11-03 ENCOUNTER — Ambulatory Visit: Payer: BC Managed Care – PPO | Admitting: Adult Health

## 2013-11-03 ENCOUNTER — Ambulatory Visit
Admission: RE | Admit: 2013-11-03 | Discharge: 2013-11-03 | Disposition: A | Discharge status: Home / Self Care | Payer: BC Managed Care – PPO | Source: Ambulatory Visit | Attending: Emergency Medicine | Admitting: Emergency Medicine

## 2013-11-03 ENCOUNTER — Other Ambulatory Visit (INDEPENDENT_AMBULATORY_CARE_PROVIDER_SITE_OTHER): Payer: Self-pay | Admitting: General Surgery

## 2013-11-03 DIAGNOSIS — N611 Abscess of the breast and nipple: Secondary | ICD-10-CM

## 2013-11-03 DIAGNOSIS — L0291 Cutaneous abscess, unspecified: Secondary | ICD-10-CM

## 2013-11-03 NOTE — Telephone Encounter (Signed)
No answer x 1. Pt scheduled to see Cyril MourningJennifer Griffin, NP today.

## 2013-11-03 NOTE — ED Provider Notes (Signed)
Medical screening examination/treatment/procedure(s) were performed by non-physician practitioner and as supervising physician I was immediately available for consultation/collaboration.  Gerhard Munchobert Mohit Zirbes, MD 11/03/13 [phone]

## 2013-11-03 NOTE — Addendum Note (Signed)
Addended byIgnacia Marvel: MOFFITT, KENDALL on: 11/03/2013 04:23 PM   Modules accepted: Orders

## 2013-11-07 LAB — WOUND CULTURE: Gram Stain: NONE SEEN

## 2014-07-31 ENCOUNTER — Other Ambulatory Visit: Payer: Self-pay

## 2015-02-16 ENCOUNTER — Other Ambulatory Visit: Payer: Self-pay | Admitting: Adult Health

## 2015-02-23 ENCOUNTER — Ambulatory Visit (INDEPENDENT_AMBULATORY_CARE_PROVIDER_SITE_OTHER): Payer: 59 | Admitting: Adult Health

## 2015-02-23 ENCOUNTER — Encounter: Payer: Self-pay | Admitting: Adult Health

## 2015-02-23 ENCOUNTER — Other Ambulatory Visit (HOSPITAL_COMMUNITY)
Admission: RE | Admit: 2015-02-23 | Discharge: 2015-02-23 | Disposition: A | Discharge status: Home / Self Care | Payer: 59 | Source: Ambulatory Visit | Attending: Obstetrics and Gynecology | Admitting: Obstetrics and Gynecology

## 2015-02-23 VITALS — BP 122/88 | HR 70 | Ht 61.5 in | Wt 183.0 lb

## 2015-02-23 DIAGNOSIS — Z01419 Encounter for gynecological examination (general) (routine) without abnormal findings: Secondary | ICD-10-CM | POA: Insufficient documentation

## 2015-02-23 DIAGNOSIS — E119 Type 2 diabetes mellitus without complications: Secondary | ICD-10-CM

## 2015-02-23 NOTE — Progress Notes (Signed)
Patient ID: Jessica Carey, female   DOB: 04/26/1985, 30 y.o.   MRN: [id number] History of Present Illness: Jessica Carey is a 30 year old Hispanic female, in for a well woman gyn exam and pap, she has stopped her Metformin and has no PCP now, she missed appt with [facility] and they won't see her now.She had some cramps in December,sounds like ovulation pain.She says blood sugar at home in 130s, never saw dietitian.    Current Medications, Allergies, Past Medical History, Past Surgical History, Family History and Social History were reviewed in [name].     Review of Systems: Patient denies any headaches, hearing loss, fatigue, blurred vision, shortness of breath, chest pain, abdominal pain, problems with bowel movements, urination, or intercourse(not having sex). No joint pain or mood swings.    Physical Exam:BP 122/88 mmHg  Pulse 70  Ht 5' 1.5" (1.562 m)  Wt 183 lb (83.008 kg)  BMI 34.02 kg/m2  LMP 02/09/2015 General:  Well developed, well nourished, no acute distress Skin:  Warm and dry Neck:  Midline trachea, normal thyroid, good ROM, no lymphadenopathy Lungs; Clear to auscultation bilaterally Breast:  No dominant palpable mass, retraction, or nipple discharge,scar right breast where had boil. Cardiovascular: Regular rate and rhythm Abdomen:  Soft, non tender, no hepatosplenomegaly Pelvic:  External genitalia is normal in appearance, no lesions.  The vagina is normal in appearance. Urethra has no lesions or masses. The cervix is smooth, pap performed.Marland Kitchen  Uterus is felt to be normal size, shape, and contour.  No adnexal masses or tenderness noted.Bladder is non tender, no masses felt. Extremities/musculoskeletal:  No swelling or varicosities noted, no clubbing or cyanosis Psych:  No mood changes, alert and cooperative,seems happy Discussed with her the importance of having PCP and taking meds and about diabetes affecting her, needs to see eye doctor, told her  may refer to Dr Fransico Him after labs back.  Impression:  Well woman gyn exam with pap Diabetes   Plan: Check CBC,CMP,TSH and lipids, A1c, will talk Monday when labs back Return in 3 months for ROS and A1c See eye doctor Check blood sugars at home and record at least once a day Physical in 1 year, pap in 2-3 if normal

## 2015-02-23 NOTE — Patient Instructions (Addendum)
Follow up for labs,  Physical in 1 year, pap in 3 if normal Check blood sugars daily

## 2015-02-24 LAB — COMPREHENSIVE METABOLIC PANEL
ALT: 31 IU/L (ref 0–32)
AST: 26 IU/L (ref 0–40)
Albumin/Globulin Ratio: 1.3 (ref 1.1–2.5)
Albumin: 4.5 g/dL (ref 3.5–5.5)
Alkaline Phosphatase: 84 IU/L (ref 39–117)
BUN/Creatinine Ratio: 17 (ref 8–20)
BUN: 12 mg/dL (ref 6–20)
Bilirubin Total: 0.4 mg/dL (ref 0.0–1.2)
CO2: 25 mmol/L (ref 18–29)
Calcium: 9.6 mg/dL (ref 8.7–10.2)
Chloride: 98 mmol/L (ref 96–106)
Creatinine, Ser: 0.7 mg/dL (ref 0.57–1.00)
GFR calc Af Amer: 136 mL/min/{1.73_m2} (ref >59)
GFR calc non Af Amer: 118 mL/min/{1.73_m2} (ref >59)
Globulin, Total: 3.4 g/dL (ref 1.5–4.5)
Glucose: 144 mg/dL — ABNORMAL HIGH (ref 65–99)
Potassium: 4.3 mmol/L (ref 3.5–5.2)
Sodium: 140 mmol/L (ref 134–144)
Total Protein: 7.9 g/dL (ref 6.0–8.5)

## 2015-02-24 LAB — TSH: TSH: 2.08 u[IU]/mL (ref 0.450–4.500)

## 2015-02-24 LAB — CBC
Hematocrit: 40.2 % (ref 34.0–46.6)
Hemoglobin: 13.5 g/dL (ref 11.1–15.9)
MCH: 28.1 pg (ref 26.6–33.0)
MCHC: 33.6 g/dL (ref 31.5–35.7)
MCV: 84 fL (ref 79–97)
Platelets: 269 10*3/uL (ref 150–379)
RBC: 4.8 x10E6/uL (ref 3.77–5.28)
RDW: 13.8 % (ref 12.3–15.4)
WBC: 10.9 10*3/uL — ABNORMAL HIGH (ref 3.4–10.8)

## 2015-02-24 LAB — HEMOGLOBIN A1C
Est. average glucose Bld gHb Est-mCnc: 154 mg/dL
Hgb A1c MFr Bld: 7 % — ABNORMAL HIGH (ref 4.8–5.6)

## 2015-02-24 LAB — LIPID PANEL
Chol/HDL Ratio: 5.2 ratio units — ABNORMAL HIGH (ref 0.0–4.4)
Cholesterol, Total: 183 mg/dL (ref 100–199)
HDL: 35 mg/dL — ABNORMAL LOW (ref >39)
LDL Calculated: 112 mg/dL — ABNORMAL HIGH (ref 0–99)
Triglycerides: 180 mg/dL — ABNORMAL HIGH (ref 0–149)
VLDL Cholesterol Cal: 36 mg/dL (ref 5–40)

## 2015-02-26 ENCOUNTER — Telehealth: Payer: Self-pay | Admitting: Adult Health

## 2015-02-26 NOTE — Telephone Encounter (Signed)
Left message to call about labs 

## 2015-02-27 ENCOUNTER — Telehealth: Payer: Self-pay | Admitting: Adult Health

## 2015-02-27 LAB — CYTOLOGY - PAP

## 2015-02-27 MED ORDER — METFORMIN HCL 500 MG PO TABS: 500.0000 mg | ORAL_TABLET | Freq: Two times a day (BID) | ORAL | Status: DC

## 2015-02-27 NOTE — Telephone Encounter (Signed)
Pt aware pap was negative, and that A1c 7 needs to go back on meds, will Rx metformin 500 mg 1 bid and watch carbs and fats and exercise and will check lipids, CMP and A1c in 3 months, will put in recall

## 2015-02-27 NOTE — Telephone Encounter (Signed)
Returning Cyril Mourning, NP phone call in regards to labs.

## 2015-05-24 ENCOUNTER — Ambulatory Visit: Payer: 59 | Admitting: Adult Health

## 2015-05-28 ENCOUNTER — Ambulatory Visit: Payer: 59 | Admitting: Adult Health

## 2015-06-06 ENCOUNTER — Ambulatory Visit: Payer: 59 | Admitting: Adult Health

## 2015-06-11 ENCOUNTER — Encounter: Payer: Self-pay | Admitting: Adult Health

## 2015-06-11 ENCOUNTER — Ambulatory Visit (INDEPENDENT_AMBULATORY_CARE_PROVIDER_SITE_OTHER): Payer: 59 | Admitting: Adult Health

## 2015-06-11 VITALS — BP 112/64 | HR 64 | Ht 66.0 in | Wt 180.0 lb

## 2015-06-11 DIAGNOSIS — E119 Type 2 diabetes mellitus without complications: Secondary | ICD-10-CM

## 2015-06-11 DIAGNOSIS — Z3202 Encounter for pregnancy test, result negative: Secondary | ICD-10-CM | POA: Diagnosis not present

## 2015-06-11 DIAGNOSIS — N926 Irregular menstruation, unspecified: Secondary | ICD-10-CM

## 2015-06-11 DIAGNOSIS — E785 Hyperlipidemia, unspecified: Secondary | ICD-10-CM

## 2015-06-11 LAB — POCT URINE PREGNANCY: Preg Test, Ur: NEGATIVE

## 2015-06-11 NOTE — Patient Instructions (Signed)
Follow up with me in 3 months Continue weight loss efforts

## 2015-06-11 NOTE — Addendum Note (Signed)
Addended by: Cyril MourningGRIFFIN, JENNIFER A on: 06/11/2015 11:54 AM   Modules accepted: Orders

## 2015-06-11 NOTE — Progress Notes (Addendum)
Subjective:     Patient ID: Jessica Carey, female   DOB: Sep 13, 1985, 30 y.o.   MRN: [id number]  HPI [name] is a 30 year old Hispanic female in to review blood sugars and recheck labs today.She has missed a period, has been using condoms.  Review of Systems Patient denies any headaches, hearing loss, fatigue, blurred vision, shortness of breath, chest pain, abdominal pain, problems with bowel movements, urination, or intercourse. No joint pain or mood swings.+missed period  Reviewed past medical,surgical, social and family history. Reviewed medications and allergies.     Objective:   Physical Exam BP 112/64 mmHg  Pulse 64  Ht 5\' 6"  (1.676 m)  Wt 180 lb (81.647 kg)  BMI 29.07 kg/m2  LMP 04/30/2015  UPT negative, Skin warm and dry. Lungs: clear to ausculation bilaterally. Cardiovascular: regular rate and rhythm.She says sugars running 130-140 after eating, has lost 3 lbs and says she feels better.A1c was 7 in January.    Assessment:     Missed period Negative UPT Diabetes Dyslipidemia     Plan:     Check A1c,CMP and lipids,will talk when labs back may refer to Dr Fransico HimNida Follow up in 3 months for A1c and ROS Continue weight loss efforts and continue metformin   Will check micro albumin ration

## 2015-06-12 LAB — LIPID PANEL
Chol/HDL Ratio: 5.2 ratio units — ABNORMAL HIGH (ref 0.0–4.4)
Cholesterol, Total: 186 mg/dL (ref 100–199)
HDL: 36 mg/dL — ABNORMAL LOW (ref >39)
LDL Calculated: 104 mg/dL — ABNORMAL HIGH (ref 0–99)
Triglycerides: 229 mg/dL — ABNORMAL HIGH (ref 0–149)
VLDL Cholesterol Cal: 46 mg/dL — ABNORMAL HIGH (ref 5–40)

## 2015-06-12 LAB — COMPREHENSIVE METABOLIC PANEL
ALT: 34 IU/L — ABNORMAL HIGH (ref 0–32)
AST: 20 IU/L (ref 0–40)
Albumin/Globulin Ratio: 1.3 (ref 1.2–2.2)
Albumin: 4.5 g/dL (ref 3.5–5.5)
Alkaline Phosphatase: 83 IU/L (ref 39–117)
BUN/Creatinine Ratio: 21 (ref 9–23)
BUN: 12 mg/dL (ref 6–20)
Bilirubin Total: 0.3 mg/dL (ref 0.0–1.2)
CO2: 25 mmol/L (ref 18–29)
Calcium: 9.7 mg/dL (ref 8.7–10.2)
Chloride: 96 mmol/L (ref 96–106)
Creatinine, Ser: 0.57 mg/dL (ref 0.57–1.00)
GFR calc Af Amer: 146 mL/min/{1.73_m2} (ref >59)
GFR calc non Af Amer: 127 mL/min/{1.73_m2} (ref >59)
Globulin, Total: 3.5 g/dL (ref 1.5–4.5)
Glucose: 135 mg/dL — ABNORMAL HIGH (ref 65–99)
Potassium: 4.8 mmol/L (ref 3.5–5.2)
Sodium: 136 mmol/L (ref 134–144)
Total Protein: 8 g/dL (ref 6.0–8.5)

## 2015-06-12 LAB — HEMOGLOBIN A1C
Est. average glucose Bld gHb Est-mCnc: 140 mg/dL
Hgb A1c MFr Bld: 6.5 % — ABNORMAL HIGH (ref 4.8–5.6)

## 2015-06-12 LAB — MICROALBUMIN / CREATININE URINE RATIO
Creatinine, Urine: 185 mg/dL
MICROALB/CREAT RATIO: 7.9 mg/g creat (ref 0.0–30.0)
Microalbumin, Urine: 14.6 ug/mL

## 2015-06-14 ENCOUNTER — Telehealth: Payer: Self-pay | Admitting: Adult Health

## 2015-06-14 NOTE — Telephone Encounter (Signed)
Pt aware of labs, A1c 6.5 which is better, but triglycerides still elevated, decrease carbs mor and increase activity and continue weight loss, will recheck in 3 months

## 2015-09-11 ENCOUNTER — Ambulatory Visit: Payer: 59 | Admitting: Adult Health

## 2015-09-20 ENCOUNTER — Encounter: Payer: Self-pay | Admitting: Adult Health

## 2015-09-20 ENCOUNTER — Ambulatory Visit (INDEPENDENT_AMBULATORY_CARE_PROVIDER_SITE_OTHER): Payer: 59 | Admitting: Adult Health

## 2015-09-20 VITALS — BP 100/72 | HR 60 | Ht 66.0 in | Wt 177.0 lb

## 2015-09-20 DIAGNOSIS — N611 Abscess of the breast and nipple: Secondary | ICD-10-CM

## 2015-09-20 DIAGNOSIS — E782 Mixed hyperlipidemia: Secondary | ICD-10-CM | POA: Diagnosis not present

## 2015-09-20 DIAGNOSIS — E119 Type 2 diabetes mellitus without complications: Secondary | ICD-10-CM

## 2015-09-20 MED ORDER — SULFAMETHOXAZOLE-TRIMETHOPRIM 800-160 MG PO TABS: 1.0000 | ORAL_TABLET | Freq: Two times a day (BID) | ORAL | 1 refills | Status: DC

## 2015-09-20 MED ORDER — METFORMIN HCL 500 MG PO TABS: 500.0000 mg | ORAL_TABLET | Freq: Two times a day (BID) | ORAL | 6 refills | Status: DC

## 2015-09-20 NOTE — Patient Instructions (Signed)
Continue taking metformin and working on weight  Follow up in 3 months

## 2015-09-20 NOTE — Progress Notes (Signed)
Subjective:     Patient ID: Jessica Carey, female   DOB: [birth year], 30 y.o.   MRN: [id number]  HPI Jessica DieterRosa is a 30 year old Hispanic female in to get lipids and A1c checked, she has lost 3 more pounds and says blood sugars around 116-120.She has booil on right breast at old scar. She said she had shake this am.  Review of Systems + boil right breast Patient denies any headaches, hearing loss, fatigue, blurred vision, shortness of breath, chest pain, abdominal pain, problems with bowel movements, urination, or intercourse. No joint pain or mood swings. Reviewed past medical,surgical, social and family history. Reviewed medications and allergies.     Objective:   Physical Exam BP 100/72   Pulse 60   Ht 5\' 6"  (1.676 m)   Wt 177 lb (80.3 kg)   LMP 09/16/2015   BMI 28.57 kg/m  Skin warm and dry. Neck: mid line trachea, normal thyroid, good ROM, no lymphadenopathy noted. Lungs: clear to ausculation bilaterally. Cardiovascular: regular rate and rhythm.Has 1 cm boil in scar on right breast, will give septra ds, keep clean and dry.   Encouraged continued weight loss. Face time 15 mintues.  Assessment:     Diabetes mellitus without complication (HCC) - Plan: Hemoglobin A1c  Boil, breast  Elevated cholesterol with elevated triglycerides - Plan: Lipid panel, Comprehensive metabolic panel     Plan:     Check CMP,Lipids and A1c Rx septra ds #20 take 1 bid with 1 refill Rx metformin 500 mg take 1 bid #60 with 6 refills Follow up in 3 months or before if needed

## 2015-09-21 ENCOUNTER — Telehealth: Payer: Self-pay | Admitting: Adult Health

## 2015-09-21 ENCOUNTER — Encounter: Payer: Self-pay | Admitting: Adult Health

## 2015-09-21 LAB — HEMOGLOBIN A1C
Est. average glucose Bld gHb Est-mCnc: 143 mg/dL
Hgb A1c MFr Bld: 6.6 % — ABNORMAL HIGH (ref 4.8–5.6)

## 2015-09-21 LAB — COMPREHENSIVE METABOLIC PANEL
ALT: 16 IU/L (ref 0–32)
AST: 14 IU/L (ref 0–40)
Albumin/Globulin Ratio: 1.3 (ref 1.2–2.2)
Albumin: 4.2 g/dL (ref 3.5–5.5)
Alkaline Phosphatase: 63 IU/L (ref 39–117)
BUN/Creatinine Ratio: 25 — ABNORMAL HIGH (ref 9–23)
BUN: 19 mg/dL (ref 6–20)
Bilirubin Total: 0.6 mg/dL (ref 0.0–1.2)
CO2: 25 mmol/L (ref 18–29)
Calcium: 9.6 mg/dL (ref 8.7–10.2)
Chloride: 100 mmol/L (ref 96–106)
Creatinine, Ser: 0.76 mg/dL (ref 0.57–1.00)
GFR calc Af Amer: 123 mL/min/{1.73_m2} (ref >59)
GFR calc non Af Amer: 107 mL/min/{1.73_m2} (ref >59)
Globulin, Total: 3.2 g/dL (ref 1.5–4.5)
Glucose: 88 mg/dL (ref 65–99)
Potassium: 4.2 mmol/L (ref 3.5–5.2)
Sodium: 140 mmol/L (ref 134–144)
Total Protein: 7.4 g/dL (ref 6.0–8.5)

## 2015-09-21 LAB — LIPID PANEL
Chol/HDL Ratio: 4 ratio units (ref 0.0–4.4)
Cholesterol, Total: 143 mg/dL (ref 100–199)
HDL: 36 mg/dL — ABNORMAL LOW (ref >39)
LDL Calculated: 83 mg/dL (ref 0–99)
Triglycerides: 122 mg/dL (ref 0–149)
VLDL Cholesterol Cal: 24 mg/dL (ref 5–40)

## 2015-09-21 NOTE — Telephone Encounter (Signed)
Pt aware of labs keep working on weight and cutting carbs

## 2015-12-20 ENCOUNTER — Ambulatory Visit: Payer: 59 | Admitting: Adult Health

## 2016-01-08 ENCOUNTER — Ambulatory Visit: Payer: 59 | Admitting: Adult Health

## 2016-01-10 IMAGING — US US BREAST LTD UNI RIGHT INC AXILLA
1 series · 5 of 5 positions shown · non-contrast
Comparison: None

CLINICAL DATA: 27-year-old female with increasing pain thickening
and redness of the inner right breast for 1 week.

EXAM:
ULTRASOUND OF THE RIGHT BREAST

[Series 1: us breast ltd uni right inc axilla · 5 of 5 slices shown]
[im 1/5]
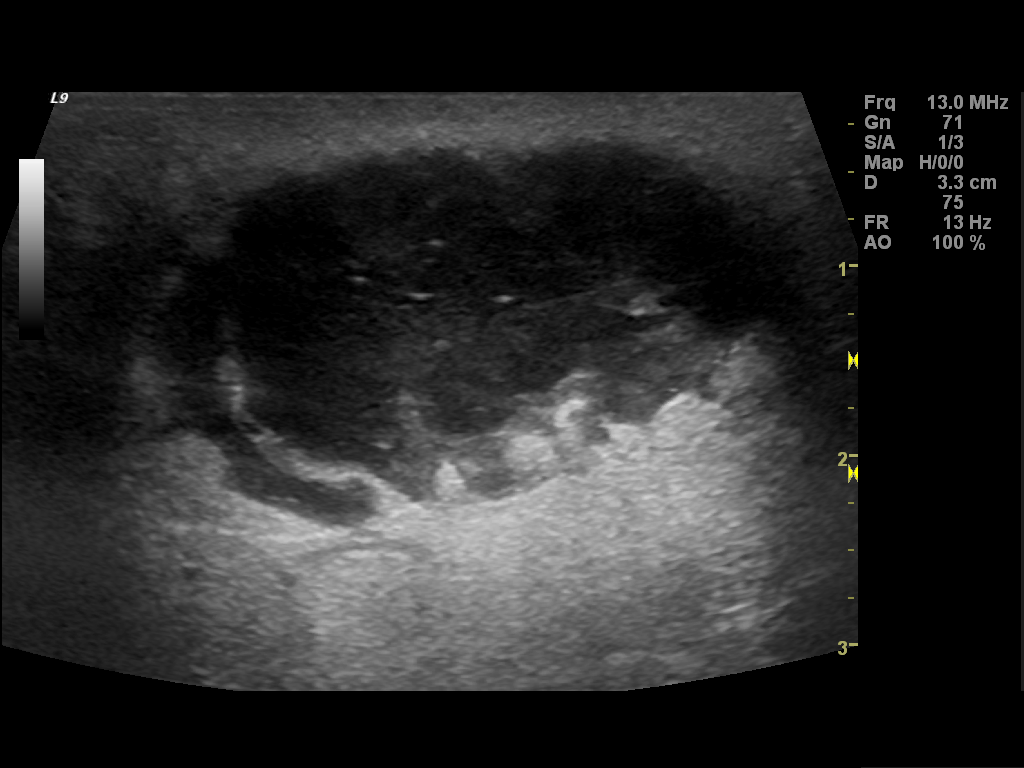
[im 2/5]
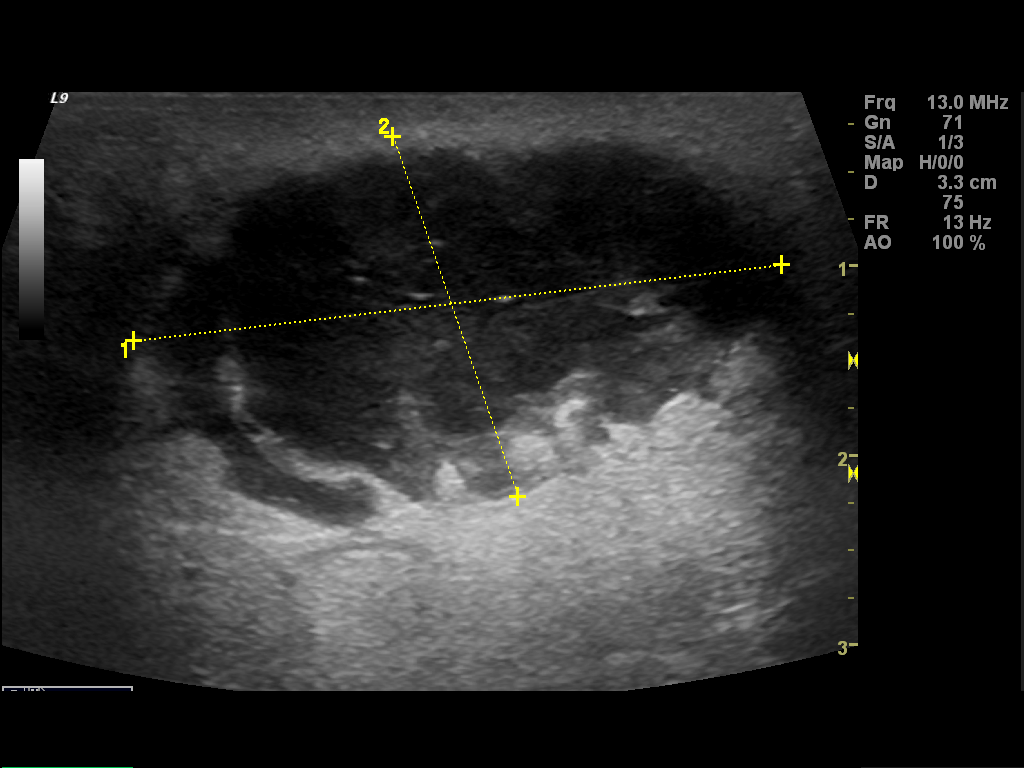
[im 3/5]
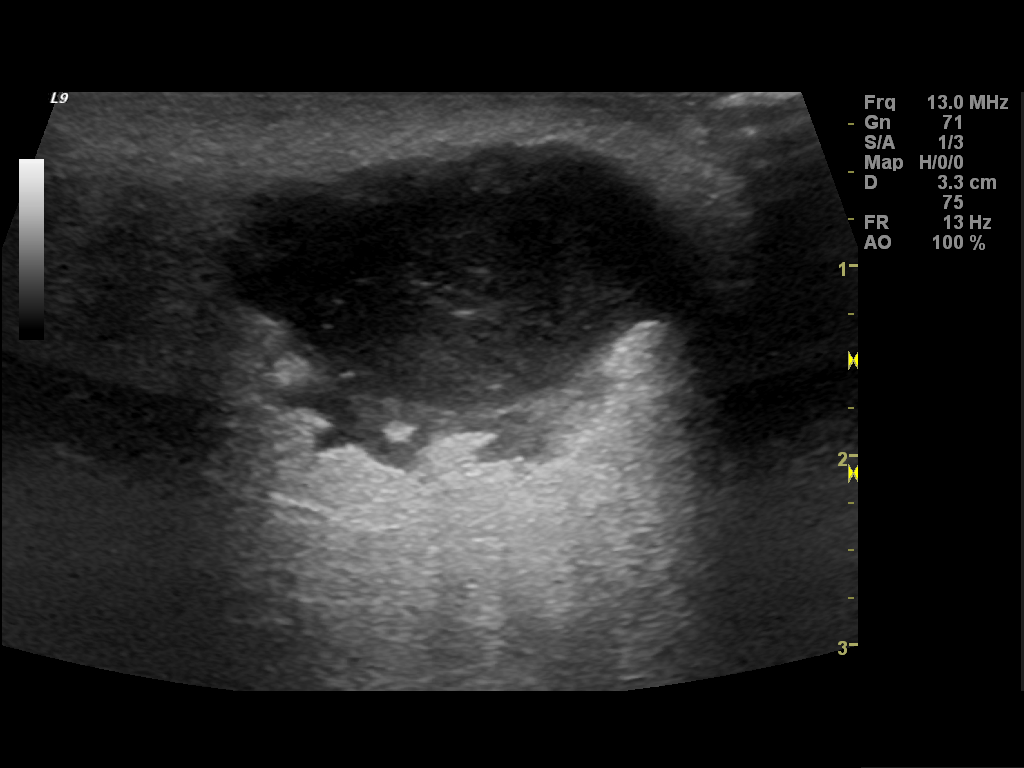
[im 4/5]
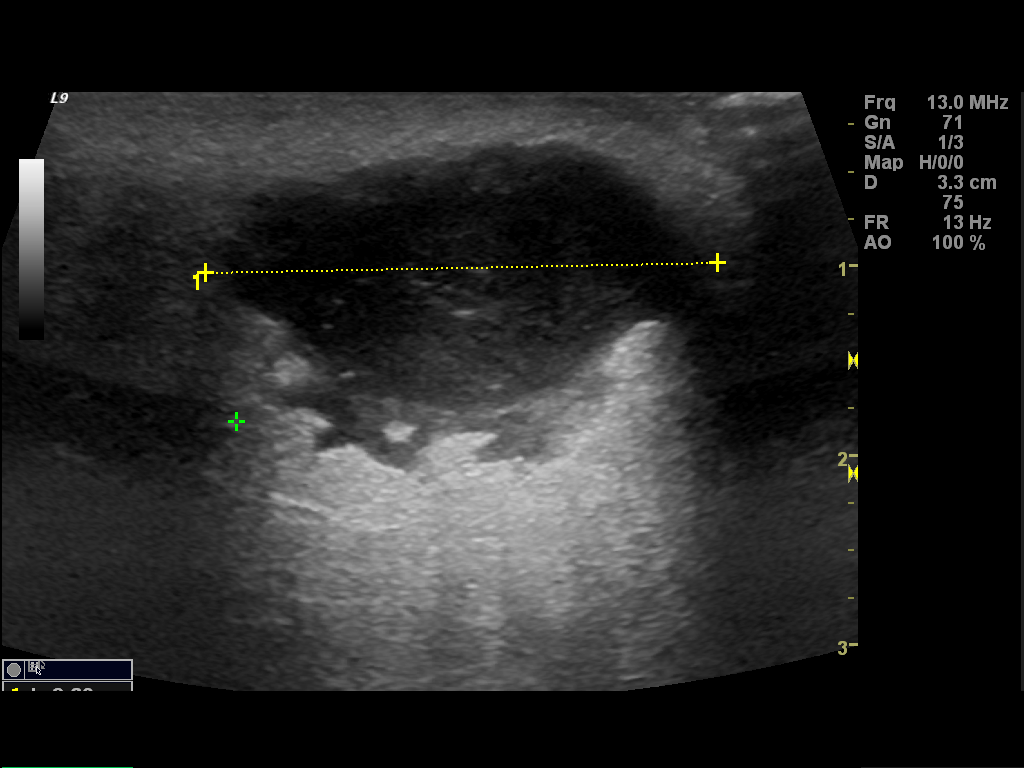
[im 5/5]
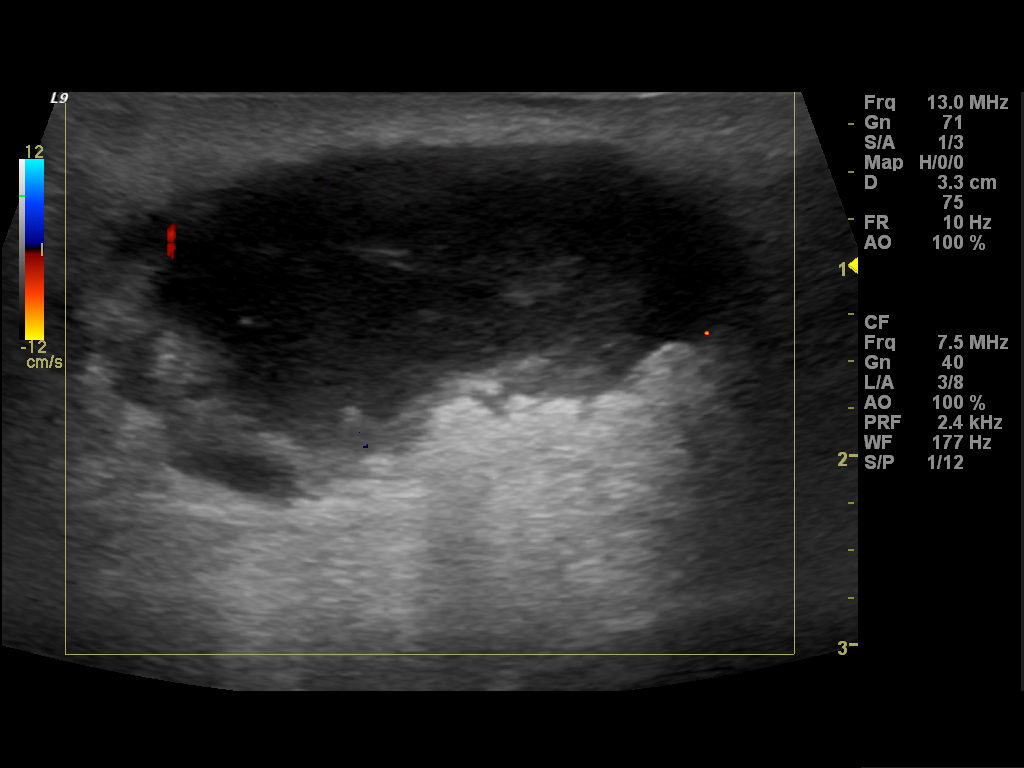

[5 of 5 positions shown; findings below may reference images not displayed]

FINDINGS: On physical exam, a firm tender mass with overlying erythema is
identified at the 3 o'clock position of the right breast 15 cm from
the nipple.

Ultrasound is performed, showing a 2 x 3.4 x 2.7 cm complicated
collection at the 3 o'clock position of the right breast 15 cm from
the nipple, compatible with abscess.
IMPRESSION: 2 x 3.4 x 2.7 cm inner right breast abscess.

RECOMMENDATION:
Surgical consultation for drainage. An appointment with Dr. Cherita
([REDACTED]) has been scheduled for [DATE] this
afternoon and the patient informed.

I have discussed the findings and recommendations with the patient.
Results were also provided in writing at the conclusion of the
visit. If applicable, a reminder letter will be sent to the patient
regarding the next appointment.

BI-RADS CATEGORY  2: Benign Finding(s)

## 2016-01-31 ENCOUNTER — Ambulatory Visit: Payer: 59 | Admitting: Adult Health

## 2016-02-15 ENCOUNTER — Ambulatory Visit: Payer: 59 | Admitting: Adult Health

## 2016-02-27 ENCOUNTER — Ambulatory Visit: Payer: 59 | Admitting: Adult Health

## 2016-03-10 ENCOUNTER — Ambulatory Visit: Payer: 59 | Admitting: Adult Health

## 2016-03-24 ENCOUNTER — Encounter: Payer: Self-pay | Admitting: Adult Health

## 2016-03-24 ENCOUNTER — Ambulatory Visit (INDEPENDENT_AMBULATORY_CARE_PROVIDER_SITE_OTHER): Payer: Managed Care, Other (non HMO) | Admitting: Adult Health

## 2016-03-24 VITALS — BP 100/60 | HR 62 | Ht 66.0 in | Wt 167.5 lb

## 2016-03-24 DIAGNOSIS — Z3202 Encounter for pregnancy test, result negative: Secondary | ICD-10-CM | POA: Diagnosis not present

## 2016-03-24 DIAGNOSIS — Z1329 Encounter for screening for other suspected endocrine disorder: Secondary | ICD-10-CM | POA: Diagnosis not present

## 2016-03-24 DIAGNOSIS — E119 Type 2 diabetes mellitus without complications: Secondary | ICD-10-CM

## 2016-03-24 LAB — POCT URINE PREGNANCY: Preg Test, Ur: NEGATIVE

## 2016-03-24 NOTE — Progress Notes (Signed)
Subjective:     Patient ID: Jessica Carey, female   DOB: [birth year], 31 y.o.   MRN: [id number]  HPI Jessica Carey is a 31 year old Hispanic female in to discuss getting labs, did not get last year as scheduled, and has stopped metformin in November, but has been eating healthy and has lost 10 more pounds.   Review of Systems Patient denies any headaches, hearing loss, fatigue, blurred vision, shortness of breath, chest pain, abdominal pain, problems with bowel movements, urination, or intercourse. No joint pain or mood swings. Reviewed past medical,surgical, social and family history. Reviewed medications and allergies.     Objective:   Physical Exam BP 100/60 (BP Location: Left Arm, Patient Position: Sitting, Cuff Size: Normal)   Pulse 62   Ht 5\' 6"  (1.676 m)   Wt 167 lb 8 oz (76 kg)   LMP 02/13/2016   BMI 27.04 kg/m UPT negative. PHQ 2 score 0.Skin warm and dry.  Lungs: clear to ausculation bilaterally. Cardiovascular: regular rate and rhythm.   Praised over weight loss of 10 lbs since August.  Assessment:        1. Diabetes mellitus without complication (HCC)   2. Pregnancy examination or test, negative result   3. Screening for thyroid disorder    Plan:     Check CBC,CMP,TSH and lipids,A1c Follow up in 3 months for physical

## 2016-03-25 LAB — CBC
Hematocrit: 40.6 % (ref 34.0–46.6)
Hemoglobin: 13.5 g/dL (ref 11.1–15.9)
MCH: 28.5 pg (ref 26.6–33.0)
MCHC: 33.3 g/dL (ref 31.5–35.7)
MCV: 86 fL (ref 79–97)
Platelets: 248 10*3/uL (ref 150–379)
RBC: 4.73 x10E6/uL (ref 3.77–5.28)
RDW: 13.7 % (ref 12.3–15.4)
WBC: 8.3 10*3/uL (ref 3.4–10.8)

## 2016-03-25 LAB — COMPREHENSIVE METABOLIC PANEL
ALT: 13 IU/L (ref 0–32)
AST: 11 IU/L (ref 0–40)
Albumin/Globulin Ratio: 1.4 (ref 1.2–2.2)
Albumin: 4.3 g/dL (ref 3.5–5.5)
Alkaline Phosphatase: 64 IU/L (ref 39–117)
BUN/Creatinine Ratio: 18 (ref 9–23)
BUN: 14 mg/dL (ref 6–20)
Bilirubin Total: 0.3 mg/dL (ref 0.0–1.2)
CO2: 26 mmol/L (ref 18–29)
Calcium: 9.5 mg/dL (ref 8.7–10.2)
Chloride: 100 mmol/L (ref 96–106)
Creatinine, Ser: 0.77 mg/dL (ref 0.57–1.00)
GFR calc Af Amer: 121 mL/min/{1.73_m2} (ref >59)
GFR calc non Af Amer: 105 mL/min/{1.73_m2} (ref >59)
Globulin, Total: 3.1 g/dL (ref 1.5–4.5)
Glucose: 119 mg/dL — ABNORMAL HIGH (ref 65–99)
Potassium: 4.7 mmol/L (ref 3.5–5.2)
Sodium: 140 mmol/L (ref 134–144)
Total Protein: 7.4 g/dL (ref 6.0–8.5)

## 2016-03-25 LAB — TSH: TSH: 1.18 u[IU]/mL (ref 0.450–4.500)

## 2016-03-25 LAB — LIPID PANEL
Chol/HDL Ratio: 4.1 ratio units (ref 0.0–4.4)
Cholesterol, Total: 150 mg/dL (ref 100–199)
HDL: 37 mg/dL — ABNORMAL LOW (ref >39)
LDL Calculated: 97 mg/dL (ref 0–99)
Triglycerides: 80 mg/dL (ref 0–149)
VLDL Cholesterol Cal: 16 mg/dL (ref 5–40)

## 2016-03-25 LAB — HEMOGLOBIN A1C
Est. average glucose Bld gHb Est-mCnc: 134 mg/dL
Hgb A1c MFr Bld: 6.3 % — ABNORMAL HIGH (ref 4.8–5.6)

## 2016-06-23 ENCOUNTER — Other Ambulatory Visit: Payer: Managed Care, Other (non HMO) | Admitting: Adult Health

## 2016-10-21 ENCOUNTER — Encounter: Payer: Self-pay | Admitting: Adult Health

## 2016-10-21 ENCOUNTER — Ambulatory Visit (INDEPENDENT_AMBULATORY_CARE_PROVIDER_SITE_OTHER): Payer: 59 | Admitting: Adult Health

## 2016-10-21 VITALS — BP 112/80 | HR 72 | Ht 62.0 in | Wt 168.0 lb

## 2016-10-21 DIAGNOSIS — Z01411 Encounter for gynecological examination (general) (routine) with abnormal findings: Secondary | ICD-10-CM | POA: Diagnosis not present

## 2016-10-21 DIAGNOSIS — Z1329 Encounter for screening for other suspected endocrine disorder: Secondary | ICD-10-CM

## 2016-10-21 DIAGNOSIS — E119 Type 2 diabetes mellitus without complications: Secondary | ICD-10-CM

## 2016-10-21 DIAGNOSIS — Z13220 Encounter for screening for lipoid disorders: Secondary | ICD-10-CM

## 2016-10-21 DIAGNOSIS — Z01419 Encounter for gynecological examination (general) (routine) without abnormal findings: Secondary | ICD-10-CM

## 2016-10-21 DIAGNOSIS — Z113 Encounter for screening for infections with a predominantly sexual mode of transmission: Secondary | ICD-10-CM

## 2016-10-21 MED ORDER — METFORMIN HCL 500 MG PO TABS: 500.0000 mg | ORAL_TABLET | Freq: Two times a day (BID) | ORAL | 12 refills | Status: DC

## 2016-10-21 NOTE — Patient Instructions (Signed)
A1c in 3 months Physical in 1 year Pap in 2020 Take OTC PNV

## 2016-10-21 NOTE — Progress Notes (Signed)
Patient ID: Jessica Carey, female   DOB: 12-Sep-1985, 31 y.o.   MRN: [id number] History of Present Illness: Jessica Carey is a 31 year old Hispanic female, divorced, in for well woman gyn exam, she had normal pap in 2017.She says she would like to be pregnant in near future.She stopped metformin and was trying to eat well, but is instructed to start taking metformin again.    Current Medications, Allergies, Past Medical History, Past Surgical History, Family History and Social History were reviewed in [name].     Review of Systems: Patient denies any headaches, hearing loss, fatigue, blurred vision, shortness of breath, chest pain, abdominal pain, problems with bowel movements, urination, or intercourse. No joint pain or mood swings.    Physical Exam:BP 112/80 (BP Location: Left Arm, Patient Position: Sitting, Cuff Size: Small)   Pulse 72   Ht  (1.575 m)   Wt 168 lb (76.2 kg)   LMP 10/04/2016   BMI 30.73 kg/m  General:  Well developed, well nourished, no acute distress Skin:  Warm and dry Neck:  Midline trachea, normal thyroid, good ROM, no lymphadenopathy Lungs; Clear to auscultation bilaterally Breast:  No dominant palpable mass, retraction, or nipple discharge Cardiovascular: Regular rate and rhythm Abdomen:  Soft, non tender, no hepatosplenomegaly Pelvic:  External genitalia is normal in appearance, no lesions.  The vagina is normal in appearance. Urethra has no lesions or masses. The cervix is smooth.  Uterus is felt to be normal size, shape, and contour.  No adnexal masses or tenderness noted.Bladder is non tender, no masses felt. Extremities/musculoskeletal:  No swelling or varicosities noted, no clubbing or cyanosis Psych:  No mood changes, alert and cooperative,seems happy PHQ 2 score 0.Discussed timing of sex.  Impression: 1. Encounter for well woman exam   2. Type 2 diabetes mellitus without complication, without long-term current use of  insulin (HCC)   3. Screening for thyroid disorder   4. Screening cholesterol level   5. Screening examination for STD (sexually transmitted disease)       Plan: Check CBC,CMP,TSH and lipids,A1c and HIV,RPR and microlbumin GC/CHL sent Physical in 1 year A1c in 3 months Pap in 2020 Take OTC PNV Get eye exam, recommended Jessica Carey

## 2016-10-22 LAB — LIPID PANEL
Chol/HDL Ratio: 4 ratio (ref 0.0–4.4)
Cholesterol, Total: 148 mg/dL (ref 100–199)
HDL: 37 mg/dL — ABNORMAL LOW (ref >39)
LDL Calculated: 73 mg/dL (ref 0–99)
Triglycerides: 188 mg/dL — ABNORMAL HIGH (ref 0–149)
VLDL Cholesterol Cal: 38 mg/dL (ref 5–40)

## 2016-10-22 LAB — COMPREHENSIVE METABOLIC PANEL
ALT: 13 IU/L (ref 0–32)
AST: 14 IU/L (ref 0–40)
Albumin/Globulin Ratio: 1.4 (ref 1.2–2.2)
Albumin: 4.6 g/dL (ref 3.5–5.5)
Alkaline Phosphatase: 59 IU/L (ref 39–117)
BUN/Creatinine Ratio: 20 (ref 9–23)
BUN: 15 mg/dL (ref 6–20)
Bilirubin Total: 0.4 mg/dL (ref 0.0–1.2)
CO2: 24 mmol/L (ref 20–29)
Calcium: 9.8 mg/dL (ref 8.7–10.2)
Chloride: 100 mmol/L (ref 96–106)
Creatinine, Ser: 0.76 mg/dL (ref 0.57–1.00)
GFR calc Af Amer: 123 mL/min/{1.73_m2} (ref >59)
GFR calc non Af Amer: 106 mL/min/{1.73_m2} (ref >59)
Globulin, Total: 3.3 g/dL (ref 1.5–4.5)
Glucose: 112 mg/dL — ABNORMAL HIGH (ref 65–99)
Potassium: 4.6 mmol/L (ref 3.5–5.2)
Sodium: 137 mmol/L (ref 134–144)
Total Protein: 7.9 g/dL (ref 6.0–8.5)

## 2016-10-22 LAB — MICROALBUMIN / CREATININE URINE RATIO
Creatinine, Urine: 309.3 mg/dL
Microalb/Creat Ratio: 5.9 mg/g creat (ref 0.0–30.0)
Microalbumin, Urine: 18.3 ug/mL

## 2016-10-22 LAB — CBC
Hematocrit: 42.4 % (ref 34.0–46.6)
Hemoglobin: 14.3 g/dL (ref 11.1–15.9)
MCH: 28.6 pg (ref 26.6–33.0)
MCHC: 33.7 g/dL (ref 31.5–35.7)
MCV: 85 fL (ref 79–97)
Platelets: 209 10*3/uL (ref 150–379)
RBC: 5 x10E6/uL (ref 3.77–5.28)
RDW: 14.1 % (ref 12.3–15.4)
WBC: 9.1 10*3/uL (ref 3.4–10.8)

## 2016-10-22 LAB — HEMOGLOBIN A1C
Est. average glucose Bld gHb Est-mCnc: 131 mg/dL
Hgb A1c MFr Bld: 6.2 % — ABNORMAL HIGH (ref 4.8–5.6)

## 2016-10-22 LAB — TSH: TSH: 1.37 u[IU]/mL (ref 0.450–4.500)

## 2016-10-22 LAB — HIV ANTIBODY (ROUTINE TESTING W REFLEX): HIV Screen 4th Generation wRfx: NONREACTIVE

## 2016-10-22 LAB — RPR: RPR Ser Ql: NONREACTIVE

## 2016-10-23 ENCOUNTER — Telehealth: Payer: Self-pay | Admitting: Adult Health

## 2016-10-23 LAB — GC/CHLAMYDIA PROBE AMP
Chlamydia trachomatis, NAA: NEGATIVE
Neisseria gonorrhoeae by PCR: NEGATIVE

## 2016-10-23 NOTE — Telephone Encounter (Signed)
Pt aware of labs and that A1c better, so continue watching diet and take metformin.

## 2017-01-20 ENCOUNTER — Other Ambulatory Visit: Payer: 59

## 2017-01-22 ENCOUNTER — Other Ambulatory Visit: Payer: 59

## 2017-01-23 LAB — HEMOGLOBIN A1C
Est. average glucose Bld gHb Est-mCnc: 134 mg/dL
Hgb A1c MFr Bld: 6.3 % — ABNORMAL HIGH (ref 4.8–5.6)

## 2017-07-31 ENCOUNTER — Ambulatory Visit: Payer: 59 | Admitting: Adult Health

## 2017-07-31 ENCOUNTER — Encounter: Payer: Self-pay | Admitting: Adult Health

## 2017-07-31 VITALS — BP 102/74 | HR 55 | Ht 64.0 in | Wt 171.0 lb

## 2017-07-31 DIAGNOSIS — Z3A01 Less than 8 weeks gestation of pregnancy: Secondary | ICD-10-CM

## 2017-07-31 DIAGNOSIS — R252 Cramp and spasm: Secondary | ICD-10-CM

## 2017-07-31 DIAGNOSIS — O3680X0 Pregnancy with inconclusive fetal viability, not applicable or unspecified: Secondary | ICD-10-CM

## 2017-07-31 DIAGNOSIS — Z3201 Encounter for pregnancy test, result positive: Secondary | ICD-10-CM | POA: Diagnosis not present

## 2017-07-31 DIAGNOSIS — E119 Type 2 diabetes mellitus without complications: Secondary | ICD-10-CM | POA: Diagnosis not present

## 2017-07-31 DIAGNOSIS — N926 Irregular menstruation, unspecified: Secondary | ICD-10-CM

## 2017-07-31 DIAGNOSIS — R350 Frequency of micturition: Secondary | ICD-10-CM | POA: Diagnosis not present

## 2017-07-31 LAB — POCT URINE PREGNANCY: Preg Test, Ur: POSITIVE — AB

## 2017-07-31 MED ORDER — FLINTSTONES COMPLETE 60 MG PO CHEW: 1.0000 | CHEWABLE_TABLET | Freq: Every day | ORAL | Status: DC

## 2017-07-31 NOTE — Patient Instructions (Signed)
First Trimester of Pregnancy The first trimester of pregnancy is from week 1 until the end of week 13 (months 1 through 3). A week after a sperm fertilizes an egg, the egg will implant on the wall of the uterus. This embryo will begin to develop into a baby. Genes from you and your partner will form the baby. The female genes will determine whether the baby will be a boy or a girl. At 6-8 weeks, the eyes and face will be formed, and the heartbeat can be seen on ultrasound. At the end of 12 weeks, all the baby's organs will be formed. Now that you are pregnant, you will want to do everything you can to have a healthy baby. Two of the most important things are to get good prenatal care and to follow your health care provider's instructions. Prenatal care is all the medical care you receive before the baby's birth. This care will help prevent, find, and treat any problems during the pregnancy and childbirth. Body changes during your first trimester Your body goes through many changes during pregnancy. The changes vary from woman to woman.  You may gain or lose a couple of pounds at first.  You may feel sick to your stomach (nauseous) and you may throw up (vomit). If the vomiting is uncontrollable, call your health care provider.  You may tire easily.  You may develop headaches that can be relieved by medicines. All medicines should be approved by your health care provider.  You may urinate more often. Painful urination may mean you have a bladder infection.  You may develop heartburn as a result of your pregnancy.  You may develop constipation because certain hormones are causing the muscles that push stool through your intestines to slow down.  You may develop hemorrhoids or swollen veins (varicose veins).  Your breasts may begin to grow larger and become tender. Your nipples may stick out more, and the tissue that surrounds them (areola) may become darker.  Your gums may bleed and may be  sensitive to brushing and flossing.  Dark spots or blotches (chloasma, mask of pregnancy) may develop on your face. This will likely fade after the baby is born.  Your menstrual periods will stop.  You may have a loss of appetite.  You may develop cravings for certain kinds of food.  You may have changes in your emotions from day to day, such as being excited to be pregnant or being concerned that something may go wrong with the pregnancy and baby.  You may have more vivid and strange dreams.  You may have changes in your hair. These can include thickening of your hair, rapid growth, and changes in texture. Some women also have hair loss during or after pregnancy, or hair that feels dry or thin. Your hair will most likely return to normal after your baby is born.  What to expect at prenatal visits During a routine prenatal visit:  You will be weighed to make sure you and the baby are growing normally.  Your blood pressure will be taken.  Your abdomen will be measured to track your baby's growth.  The fetal heartbeat will be listened to between weeks 10 and 14 of your pregnancy.  Test results from any previous visits will be discussed.  Your health care provider may ask you:  How you are feeling.  If you are feeling the baby move.  If you have had any abnormal symptoms, such as leaking fluid, bleeding, severe headaches,   or abdominal cramping.  If you are using any tobacco products, including cigarettes, chewing tobacco, and electronic cigarettes.  If you have any questions.  Other tests that may be performed during your first trimester include:  Blood tests to find your blood type and to check for the presence of any previous infections. The tests will also be used to check for low iron levels (anemia) and protein on red blood cells (Rh antibodies). Depending on your risk factors, or if you previously had diabetes during pregnancy, you may have tests to check for high blood  sugar that affects pregnant women (gestational diabetes).  Urine tests to check for infections, diabetes, or protein in the urine.  An ultrasound to confirm the proper growth and development of the baby.  Fetal screens for spinal cord problems (spina bifida) and Down syndrome.  HIV (human immunodeficiency virus) testing. Routine prenatal testing includes screening for HIV, unless you choose not to have this test.  You may need other tests to make sure you and the baby are doing well.  Follow these instructions at home: Medicines  Follow your health care provider's instructions regarding medicine use. Specific medicines may be either safe or unsafe to take during pregnancy.  Take a prenatal vitamin that contains at least 600 micrograms (mcg) of folic acid.  If you develop constipation, try taking a stool softener if your health care provider approves. Eating and drinking  Eat a balanced diet that includes fresh fruits and vegetables, whole grains, good sources of protein such as meat, eggs, or tofu, and low-fat dairy. Your health care provider will help you determine the amount of weight gain that is right for you.  Avoid raw meat and uncooked cheese. These carry germs that can cause birth defects in the baby.  Eating four or five small meals rather than three large meals a day may help relieve nausea and vomiting. If you start to feel nauseous, eating a few soda crackers can be helpful. Drinking liquids between meals, instead of during meals, also seems to help ease nausea and vomiting.  Limit foods that are high in fat and processed sugars, such as fried and sweet foods.  To prevent constipation: ? Eat foods that are high in fiber, such as fresh fruits and vegetables, whole grains, and beans. ? Drink enough fluid to keep your urine clear or pale yellow. Activity  Exercise only as directed by your health care provider. Most women can continue their usual exercise routine during  pregnancy. Try to exercise for 30 minutes at least 5 days a week. Exercising will help you: ? Control your weight. ? Stay in shape. ? Be prepared for labor and delivery.  Experiencing pain or cramping in the lower abdomen or lower back is a good sign that you should stop exercising. Check with your health care provider before continuing with normal exercises.  Try to avoid standing for long periods of time. Move your legs often if you must stand in one place for a long time.  Avoid heavy lifting.  Wear low-heeled shoes and practice good posture.  You may continue to have sex unless your health care provider tells you not to. Relieving pain and discomfort  Wear a good support bra to relieve breast tenderness.  Take warm sitz baths to soothe any pain or discomfort caused by hemorrhoids. Use hemorrhoid cream if your health care provider approves.  Rest with your legs elevated if you have leg cramps or low back pain.  If you develop   varicose veins in your legs, wear support hose. Elevate your feet for 15 minutes, 3-4 times a day. Limit salt in your diet. Prenatal care  Schedule your prenatal visits by the twelfth week of pregnancy. They are usually scheduled monthly at first, then more often in the last 2 months before delivery.  Write down your questions. Take them to your prenatal visits.  Keep all your prenatal visits as told by your health care provider. This is important. Safety  Wear your seat belt at all times when driving.  Make a list of emergency phone numbers, including numbers for family, friends, the hospital, and police and fire departments. General instructions  Ask your health care provider for a referral to a local prenatal education class. Begin classes no later than the beginning of month 6 of your pregnancy.  Ask for help if you have counseling or nutritional needs during pregnancy. Your health care provider can offer advice or refer you to specialists for help  with various needs.  Do not use hot tubs, steam rooms, or saunas.  Do not douche or use tampons or scented sanitary pads.  Do not cross your legs for long periods of time.  Avoid cat litter boxes and soil used by cats. These carry germs that can cause birth defects in the baby and possibly loss of the fetus by miscarriage or stillbirth.  Avoid all smoking, herbs, alcohol, and medicines not prescribed by your health care provider. Chemicals in these products affect the formation and growth of the baby.  Do not use any products that contain nicotine or tobacco, such as cigarettes and e-cigarettes. If you need help quitting, ask your health care provider. You may receive counseling support and other resources to help you quit.  Schedule a dentist appointment. At home, brush your teeth with a soft toothbrush and be gentle when you floss. Contact a health care provider if:  You have dizziness.  You have mild pelvic cramps, pelvic pressure, or nagging pain in the abdominal area.  You have persistent nausea, vomiting, or diarrhea.  You have a bad smelling vaginal discharge.  You have pain when you urinate.  You notice increased swelling in your face, hands, legs, or ankles.  You are exposed to fifth disease or chickenpox.  You are exposed to German measles (rubella) and have never had it. Get help right away if:  You have a fever.  You are leaking fluid from your vagina.  You have spotting or bleeding from your vagina.  You have severe abdominal cramping or pain.  You have rapid weight gain or loss.  You vomit blood or material that looks like coffee grounds.  You develop a severe headache.  You have shortness of breath.  You have any kind of trauma, such as from a fall or a car accident. Summary  The first trimester of pregnancy is from week 1 until the end of week 13 (months 1 through 3).  Your body goes through many changes during pregnancy. The changes vary from  woman to woman.  You will have routine prenatal visits. During those visits, your health care provider will examine you, discuss any test results you may have, and talk with you about how you are feeling. This information is not intended to replace advice given to you by your health care provider. Make sure you discuss any questions you have with your health care provider. Document Released: 01/14/2001 Document Revised: 01/02/2016 Document Reviewed: 01/02/2016 Elsevier Interactive Patient Education  2018 [employer]   Inc.  

## 2017-07-31 NOTE — Progress Notes (Signed)
  Subjective:     Patient ID: Jessica Carey, female   DOB: 1985-10-23, 32 y.o.   MRN: [id number]  HPI Jessica Carey is a 32 year old Hispanic female in complaining of having missed a period and frequent urination, and cramping.   Review of Systems +missed period +cramping +frequent urination    Reviewed past medical,surgical, social and family history. Reviewed medications and allergies.  Objective:   Physical Exam BP 102/74 (BP Location: Left Arm, Patient Position: Sitting, Cuff Size: Small)   Pulse (!) 55   Ht 5\' 4"  (1.626 m)   Wt 171 lb (77.6 kg)   LMP 06/10/2017   BMI 29.35 kg/m +UPT, about 7+2 weeks by LMP with EDD 03/17/18.Skin warm and dry. Neck: mid line trachea, normal thyroid, good ROM, no lymphadenopathy noted. Lungs: clear to ausculation bilaterally. Cardiovascular: regular rate and rhythm.Abdomen is soft and non tender. She was surprised.     Assessment:     1. Pregnancy test positive   2. Less than [redacted] weeks gestation of pregnancy   3. Encounter to determine fetal viability of pregnancy, single or unspecified fetus   4. Type 2 diabetes mellitus without complication, without long-term current use of insulin (HCC)       Plan:    Increase fluids, esp water  Meds ordered this encounter  Medications  . flintstones complete (FLINTSTONES) 60 MG chewable tablet    Sig: Chew 1 tablet by mouth daily.    Order Specific Question:   Supervising Provider    Answer:   Lazaro ArmsEURE, LUTHER H [[id number]]  Return in about 1 week for dating US Review handouts on First trimester and by [facility]  Stop aleve  Continue metformin

## 2017-08-03 ENCOUNTER — Encounter: Payer: Self-pay | Admitting: *Deleted

## 2017-08-03 ENCOUNTER — Encounter: Payer: Self-pay | Admitting: Adult Health

## 2017-08-03 ENCOUNTER — Telehealth: Payer: Self-pay | Admitting: Adult Health

## 2017-08-03 NOTE — Telephone Encounter (Signed)
Patient called stating that she was here on Friday to see Victorino DikeJennifer for a preg test, pt states that she was suppose to get a letter stating that she is not able to lift anything over 20lbs. Pt states that she did not get the letter, she would like us to send the letter Via email @ [email or web address].

## 2017-08-11 ENCOUNTER — Telehealth: Payer: Self-pay | Admitting: *Deleted

## 2017-08-11 NOTE — Telephone Encounter (Signed)
Patient states she went to the bathroom and noticed some very light pink spotting and she is concerned.  No intercourse recently.  Advised patient that since it was just one time, to continue to monitor and if bleeding becomes heavier or brighter red, to give us a call back. Advised to keep u/s as scheduled at this time.  Verbalized understanding.

## 2017-08-17 ENCOUNTER — Ambulatory Visit (INDEPENDENT_AMBULATORY_CARE_PROVIDER_SITE_OTHER): Payer: BLUE CROSS/BLUE SHIELD

## 2017-08-17 DIAGNOSIS — O3680X0 Pregnancy with inconclusive fetal viability, not applicable or unspecified: Secondary | ICD-10-CM

## 2017-08-17 DIAGNOSIS — Z3A08 8 weeks gestation of pregnancy: Secondary | ICD-10-CM

## 2017-08-17 NOTE — Progress Notes (Signed)
US 8 wks,single IUP w/ys,positive fht 161 bpm,normal ovaries bilat,crl 16.62 mm

## 2017-08-20 DIAGNOSIS — Z029 Encounter for administrative examinations, unspecified: Secondary | ICD-10-CM

## 2017-09-02 ENCOUNTER — Ambulatory Visit: Payer: BLUE CROSS/BLUE SHIELD | Admitting: *Deleted

## 2017-09-02 ENCOUNTER — Ambulatory Visit (INDEPENDENT_AMBULATORY_CARE_PROVIDER_SITE_OTHER): Payer: BLUE CROSS/BLUE SHIELD | Admitting: Advanced Practice Midwife

## 2017-09-02 ENCOUNTER — Encounter: Payer: Self-pay | Admitting: Advanced Practice Midwife

## 2017-09-02 ENCOUNTER — Other Ambulatory Visit: Payer: Self-pay

## 2017-09-02 VITALS — BP 113/72 | HR 79 | Wt 174.0 lb

## 2017-09-02 DIAGNOSIS — Z3682 Encounter for antenatal screening for nuchal translucency: Secondary | ICD-10-CM

## 2017-09-02 DIAGNOSIS — Z3A10 10 weeks gestation of pregnancy: Secondary | ICD-10-CM

## 2017-09-02 DIAGNOSIS — E119 Type 2 diabetes mellitus without complications: Secondary | ICD-10-CM

## 2017-09-02 DIAGNOSIS — Z331 Pregnant state, incidental: Secondary | ICD-10-CM

## 2017-09-02 DIAGNOSIS — O0990 Supervision of high risk pregnancy, unspecified, unspecified trimester: Secondary | ICD-10-CM | POA: Diagnosis not present

## 2017-09-02 DIAGNOSIS — O24111 Pre-existing diabetes mellitus, type 2, in pregnancy, first trimester: Secondary | ICD-10-CM

## 2017-09-02 DIAGNOSIS — Z1389 Encounter for screening for other disorder: Secondary | ICD-10-CM

## 2017-09-02 LAB — POCT URINALYSIS DIPSTICK OB
Blood, UA: NEGATIVE
Glucose, UA: NEGATIVE — AB
Ketones, UA: NEGATIVE
Leukocytes, UA: NEGATIVE
Nitrite, UA: NEGATIVE
POC,PROTEIN,UA: NEGATIVE

## 2017-09-02 MED ORDER — PNV PRENATAL PLUS MULTIVITAMIN 27-1 MG PO TABS: 1.0000 | ORAL_TABLET | Freq: Every day | ORAL | 11 refills | Status: DC

## 2017-09-02 NOTE — Progress Notes (Signed)
  Subjective:    Jessica Carey is a G1P0000 603w2d being seen today for her first obstetrical visit.  Her obstetrical history is significant for first pregnancy.  Pregnancy history fully reviewed. She is a class B DM, on metformin 500 bid.  Doesn't check BS regularly.  FOB not involved.  Patient reports nausea and no bleeding.  Vitals:   09/02/17 1413  BP: 113/72  Pulse: 79  Weight: 174 lb (78.9 kg)    HISTORY: OB History  Gravida Para Term Preterm AB Living  1 0 0 0 0 0  SAB TAB Ectopic Multiple Live Births  0 0 0 0 0    # Outcome Date GA Lbr Len/2nd Weight Sex Delivery Anes PTL Lv  1 Current            Past Medical History:  Diagnosis Date  . Boil, breast 07/04/2013  . Diabetes (HCC) 08/04/2013  . Diabetes mellitus without complication (HCC)   . Irregular periods    Past Surgical History:  Procedure Laterality Date  . BREAST SURGERY Right    breast abcess  . SURGERY OF LIP     Family History  Problem Relation Age of Onset  . Diabetes Mother   . Diabetes Father   . Hyperlipidemia Father   . Diabetes Sister   . Cancer Maternal Aunt        liver  . Diabetes Paternal Grandmother      Exam                                      System:     Skin: normal coloration and turgor, no rashes    Neurologic: oriented, normal, normal mood   Extremities: normal strength, tone, and muscle mass   HEENT PERRLA   Mouth/Teeth mucous membranes moist, NORMAL dentition   Neck supple and no masses   Cardiovascular: regular rate and rhythm   Respiratory:  appears well, vitals normal, no respiratory distress, acyanotic   Abdomen: soft, non-tender;  FHR: 150US        The [facility] with multiple MDs and other Advanced Practice Providers was explained to patient; also emphasized that residents, students are part of our team.  Assessment:    Pregnancy: G1P0000 Patient Active Problem List   Diagnosis Date Noted  . Supervision  of high risk pregnancy, antepartum 09/02/2017  . Screening for thyroid disorder 10/21/2016  . Type 2 diabetes mellitus without complication, without long-term current use of insulin (HCC) 08/04/2013  . Boil, breast 07/04/2013        Plan:     Initial labs drawn. Hgb A1c added (was 6.2 10 months ago) Continue prenatal vitamins  QID BS testing, supplies given Problem list reviewed and updated  Reviewed n/v relief measures and warning s/s to report  Reviewed recommended weight gain based on pre-gravid BMI  Encouraged well-balanced diet Genetic Screening discussed Integrated Screen: requested.  Ultrasound discussed; fetal survey: requested.  Return in about 3 weeks (around 09/23/2017) for HROB, US:NT+1st IT.  Jessica Carey 09/02/2017

## 2017-09-02 NOTE — Patient Instructions (Addendum)
 First Trimester of Pregnancy The first trimester of pregnancy is from week 1 until the end of week 12 (months 1 through 3). A week after a sperm fertilizes an egg, the egg will implant on the wall of the uterus. This embryo will begin to develop into a baby. Genes from you and your partner are forming the baby. The female genes determine whether the baby is a boy or a girl. At 6-8 weeks, the eyes and face are formed, and the heartbeat can be seen on ultrasound. At the end of 12 weeks, all the baby's organs are formed.  Now that you are pregnant, you will want to do everything you can to have a healthy baby. Two of the most important things are to get good prenatal care and to follow your health care provider's instructions. Prenatal care is all the medical care you receive before the baby's birth. This care will help prevent, find, and treat any problems during the pregnancy and childbirth. BODY CHANGES Your body goes through many changes during pregnancy. The changes vary from woman to woman.   You may gain or lose a couple of pounds at first.  You may feel sick to your stomach (nauseous) and throw up (vomit). If the vomiting is uncontrollable, call your health care provider.  You may tire easily.  You may develop headaches that can be relieved by medicines approved by your health care provider.  You may urinate more often. Painful urination may mean you have a bladder infection.  You may develop heartburn as a result of your pregnancy.  You may develop constipation because certain hormones are causing the muscles that push waste through your intestines to slow down.  You may develop hemorrhoids or swollen, bulging veins (varicose veins).  Your breasts may begin to grow larger and become tender. Your nipples may stick out more, and the tissue that surrounds them (areola) may become darker.  Your gums may bleed and may be sensitive to brushing and flossing.  Dark spots or blotches  (chloasma, mask of pregnancy) may develop on your face. This will likely fade after the baby is born.  Your menstrual periods will stop.  You may have a loss of appetite.  You may develop cravings for certain kinds of food.  You may have changes in your emotions from day to day, such as being excited to be pregnant or being concerned that something may go wrong with the pregnancy and baby.  You may have more vivid and strange dreams.  You may have changes in your hair. These can include thickening of your hair, rapid growth, and changes in texture. Some women also have hair loss during or after pregnancy, or hair that feels dry or thin. Your hair will most likely return to normal after your baby is born. WHAT TO EXPECT AT YOUR PRENATAL VISITS During a routine prenatal visit:  You will be weighed to make sure you and the baby are growing normally.  Your blood pressure will be taken.  Your abdomen will be measured to track your baby's growth.  The fetal heartbeat will be listened to starting around week 10 or 12 of your pregnancy.  Test results from any previous visits will be discussed. Your health care provider may ask you:  How you are feeling.  If you are feeling the baby move.  If you have had any abnormal symptoms, such as leaking fluid, bleeding, severe headaches, or abdominal cramping.  If you have any questions. Other   tests that may be performed during your first trimester include:  Blood tests to find your blood type and to check for the presence of any previous infections. They will also be used to check for low iron levels (anemia) and Rh antibodies. Later in the pregnancy, blood tests for diabetes will be done along with other tests if problems develop.  Urine tests to check for infections, diabetes, or protein in the urine.  An ultrasound to confirm the proper growth and development of the baby.  An amniocentesis to check for possible genetic problems.  Fetal  screens for spina bifida and Down syndrome.  You may need other tests to make sure you and the baby are doing well. HOME CARE INSTRUCTIONS  Medicines  Follow your health care provider's instructions regarding medicine use. Specific medicines may be either safe or unsafe to take during pregnancy.  Take your prenatal vitamins as directed.  If you develop constipation, try taking a stool softener if your health care provider approves. Diet  Eat regular, well-balanced meals. Choose a variety of foods, such as meat or vegetable-based protein, fish, milk and low-fat dairy products, vegetables, fruits, and whole grain breads and cereals. Your health care provider will help you determine the amount of weight gain that is right for you.  Avoid raw meat and uncooked cheese. These carry germs that can cause birth defects in the baby.  Eating four or five small meals rather than three large meals a day may help relieve nausea and vomiting. If you start to feel nauseous, eating a few soda crackers can be helpful. Drinking liquids between meals instead of during meals also seems to help nausea and vomiting.  If you develop constipation, eat more high-fiber foods, such as fresh vegetables or fruit and whole grains. Drink enough fluids to keep your urine clear or pale yellow. Activity and Exercise  Exercise only as directed by your health care provider. Exercising will help you:  Control your weight.  Stay in shape.  Be prepared for labor and delivery.  Experiencing pain or cramping in the lower abdomen or low back is a good sign that you should stop exercising. Check with your health care provider before continuing normal exercises.  Try to avoid standing for long periods of time. Move your legs often if you must stand in one place for a long time.  Avoid heavy lifting.  Wear low-heeled shoes, and practice good posture.  You may continue to have sex unless your health care provider directs you  otherwise. Relief of Pain or Discomfort  Wear a good support bra for breast tenderness.   Take warm sitz baths to soothe any pain or discomfort caused by hemorrhoids. Use hemorrhoid cream if your health care provider approves.   Rest with your legs elevated if you have leg cramps or low back pain.  If you develop varicose veins in your legs, wear support hose. Elevate your feet for 15 minutes, 3-4 times a day. Limit salt in your diet. Prenatal Care  Schedule your prenatal visits by the twelfth week of pregnancy. They are usually scheduled monthly at first, then more often in the last 2 months before delivery.  Write down your questions. Take them to your prenatal visits.  Keep all your prenatal visits as directed by your health care provider. Safety  Wear your seat belt at all times when driving.  Make a list of emergency phone numbers, including numbers for family, friends, the hospital, and police and fire departments. General   Tips  Ask your health care provider for a referral to a local prenatal education class. Begin classes no later than at the beginning of month 6 of your pregnancy.  Ask for help if you have counseling or nutritional needs during pregnancy. Your health care provider can offer advice or refer you to specialists for help with various needs.  Do not use hot tubs, steam rooms, or saunas.  Do not douche or use tampons or scented sanitary pads.  Do not cross your legs for long periods of time.  Avoid cat litter boxes and soil used by cats. These carry germs that can cause birth defects in the baby and possibly loss of the fetus by miscarriage or stillbirth.  Avoid all smoking, herbs, alcohol, and medicines not prescribed by your health care provider. Chemicals in these affect the formation and growth of the baby.  Schedule a dentist appointment. At home, brush your teeth with a soft toothbrush and be gentle when you floss. SEEK MEDICAL CARE IF:   You have  dizziness.  You have mild pelvic cramps, pelvic pressure, or nagging pain in the abdominal area.  You have persistent nausea, vomiting, or diarrhea.  You have a bad smelling vaginal discharge.  You have pain with urination.  You notice increased swelling in your face, hands, legs, or ankles. SEEK IMMEDIATE MEDICAL CARE IF:   You have a fever.  You are leaking fluid from your vagina.  You have spotting or bleeding from your vagina.  You have severe abdominal cramping or pain.  You have rapid weight gain or loss.  You vomit blood or material that looks like coffee grounds.  You are exposed to German measles and have never had them.  You are exposed to fifth disease or chickenpox.  You develop a severe headache.  You have shortness of breath.  You have any kind of trauma, such as from a fall or a car accident. Document Released: 01/14/2001 Document Revised: 06/06/2013 Document Reviewed: 11/30/2012 Exit[employer] Patient Information 2015 [employer]. This information is not intended to replace advice given to you by your health care provider. Make sure you discuss any questions you have with your health care provider.   Nausea & Vomiting  Have saltine crackers or pretzels by your bed and eat a few bites before you raise your head out of bed in the morning  Eat small frequent meals throughout the day instead of large meals  Drink plenty of fluids throughout the day to stay hydrated, just don't drink a lot of fluids with your meals.  This can make your stomach fill up faster making you feel sick  Do not brush your teeth right after you eat  Products with real ginger are good for nausea, like ginger ale and ginger hard candy Make sure it says made with real ginger!  Sucking on sour candy like lemon heads is also good for nausea  If your prenatal vitamins make you nauseated, take them at night so you will sleep through the nausea  Sea Bands  If you feel like you need  medicine for the nausea & vomiting please let us know  If you are unable to keep any fluids or food down please let us know   Constipation  Drink plenty of fluid, preferably water, throughout the day  Eat foods high in fiber such as fruits, vegetables, and grains  Exercise, such as walking, is a good way to keep your bowels regular  Drink warm fluids, especially warm   prune juice, or decaf coffee  Eat a 1/2 cup of real oatmeal (not instant), 1/2 cup applesauce, and 1/2-1 cup warm prune juice every day  If needed, you may take Colace (docusate sodium) stool softener once or twice a day to help keep the stool soft. If you are pregnant, wait until you are out of your first trimester (12-14 weeks of pregnancy)  If you still are having problems with constipation, you may take Miralax once daily as needed to help keep your bowels regular.  If you are pregnant, wait until you are out of your first trimester (12-14 weeks of pregnancy)  Safe Medications in Pregnancy   Acne: Benzoyl Peroxide Salicylic Acid  Backache/Headache: Tylenol: 2 regular strength every 4 hours OR              2 Extra strength every 6 hours  Colds/Coughs/Allergies: Benadryl (alcohol free) 25 mg every 6 hours as needed Breath right strips Claritin Cepacol throat lozenges Chloraseptic throat spray Cold-Eeze- up to three times per day Cough drops, alcohol free Flonase (by prescription only) Guaifenesin Mucinex Robitussin DM (plain only, alcohol free) Saline nasal spray/drops Sudafed (pseudoephedrine) & Actifed ** use only after [redacted] weeks gestation and if you do not have high blood pressure Tylenol Vicks Vaporub Zinc lozenges Zyrtec   Constipation: Colace Ducolax suppositories Fleet enema Glycerin suppositories Metamucil Milk of magnesia Miralax Senokot Smooth move tea  Diarrhea: Kaopectate Imodium A-D  *NO pepto Bismol  Hemorrhoids: Anusol Anusol HC Preparation  H Tucks  Indigestion: Tums Maalox Mylanta Zantac  Pepcid  Insomnia: Benadryl (alcohol free) 25mg  every 6 hours as needed Tylenol PM Unisom, no Gelcaps  Leg Cramps: Tums MagGel  Nausea/Vomiting:  Bonine Dramamine Emetrol Ginger extract Sea bands Meclizine  Nausea medication to take during pregnancy:  Unisom (doxylamine succinate 25 mg tablets) Take one tablet daily at bedtime. If symptoms are not adequately controlled, the dose can be increased to a maximum recommended dose of two tablets daily (1/2 tablet in the morning, 1/2 tablet mid-afternoon and one at bedtime). Vitamin B6 100mg  tablets. Take one tablet twice a day (up to 200 mg per day).  Skin Rashes: Aveeno products Benadryl cream or 25mg  every 6 hours as needed Calamine Lotion 1% cortisone cream  Yeast infection: Gyne-lotrimin 7 Monistat 7   **If taking multiple medications, please check labels to avoid duplicating the same active ingredients **take medication as directed on the label ** Do not exceed 4000 mg of tylenol in 24 hours **Do not take medications that contain aspirin or ibuprofen     Fasting <95 1 hour after meals <140

## 2017-09-03 LAB — PMP SCREEN PROFILE (10S), URINE
Amphetamine Scrn, Ur: NEGATIVE ng/mL
BARBITURATE SCREEN URINE: NEGATIVE ng/mL
BENZODIAZEPINE SCREEN, URINE: NEGATIVE ng/mL
CANNABINOIDS UR QL SCN: NEGATIVE ng/mL
Cocaine (Metab) Scrn, Ur: NEGATIVE ng/mL
Creatinine(Crt), U: 46.9 mg/dL (ref 20.0–300.0)
Methadone Screen, Urine: NEGATIVE ng/mL
OXYCODONE+OXYMORPHONE UR QL SCN: NEGATIVE ng/mL
Opiate Scrn, Ur: NEGATIVE ng/mL
Ph of Urine: 7 (ref 4.5–8.9)
Phencyclidine Qn, Ur: NEGATIVE ng/mL
Propoxyphene Scrn, Ur: NEGATIVE ng/mL

## 2017-09-03 LAB — OBSTETRIC PANEL, INCLUDING HIV
Antibody Screen: NEGATIVE
Basophils Absolute: 0 10*3/uL (ref 0.0–0.2)
Basos: 0 %
EOS (ABSOLUTE): 0.1 10*3/uL (ref 0.0–0.4)
Eos: 1 %
HIV Screen 4th Generation wRfx: NONREACTIVE
Hematocrit: 39.8 % (ref 34.0–46.6)
Hemoglobin: 13.4 g/dL (ref 11.1–15.9)
Hepatitis B Surface Ag: NEGATIVE
Immature Grans (Abs): 0 10*3/uL (ref 0.0–0.1)
Immature Granulocytes: 0 %
Lymphocytes Absolute: 2.9 10*3/uL (ref 0.7–3.1)
Lymphs: 20 %
MCH: 29.1 pg (ref 26.6–33.0)
MCHC: 33.7 g/dL (ref 31.5–35.7)
MCV: 86 fL (ref 79–97)
Monocytes Absolute: 0.6 10*3/uL (ref 0.1–0.9)
Monocytes: 4 %
Neutrophils Absolute: 10.4 10*3/uL — ABNORMAL HIGH (ref 1.4–7.0)
Neutrophils: 75 %
Platelets: 218 10*3/uL (ref 150–450)
RBC: 4.61 x10E6/uL (ref 3.77–5.28)
RDW: 14.5 % (ref 12.3–15.4)
RPR Ser Ql: NONREACTIVE
Rh Factor: POSITIVE
Rubella Antibodies, IGG: 15.7 index (ref >0.99)
WBC: 14.1 10*3/uL — ABNORMAL HIGH (ref 3.4–10.8)

## 2017-09-03 LAB — URINALYSIS, ROUTINE W REFLEX MICROSCOPIC
Bilirubin, UA: NEGATIVE
Glucose, UA: NEGATIVE
Ketones, UA: NEGATIVE
Leukocytes, UA: NEGATIVE
Nitrite, UA: NEGATIVE
Protein, UA: NEGATIVE
RBC, UA: NEGATIVE
Specific Gravity, UA: 1.011 (ref 1.005–1.030)
Urobilinogen, Ur: 0.2 mg/dL (ref 0.2–1.0)
pH, UA: 7 (ref 5.0–7.5)

## 2017-09-03 LAB — HEMOGLOBIN A1C
Est. average glucose Bld gHb Est-mCnc: 128 mg/dL
Hgb A1c MFr Bld: 6.1 % — ABNORMAL HIGH (ref 4.8–5.6)

## 2017-09-04 LAB — GC/CHLAMYDIA PROBE AMP
Chlamydia trachomatis, NAA: NEGATIVE
Neisseria gonorrhoeae by PCR: NEGATIVE

## 2017-09-04 LAB — URINE CULTURE

## 2017-09-21 ENCOUNTER — Telehealth: Payer: Self-pay | Admitting: Women's Health

## 2017-09-21 DIAGNOSIS — O99419 Diseases of the circulatory system complicating pregnancy, unspecified trimester: Secondary | ICD-10-CM | POA: Diagnosis not present

## 2017-09-21 DIAGNOSIS — Z3A13 13 weeks gestation of pregnancy: Secondary | ICD-10-CM | POA: Diagnosis not present

## 2017-09-21 DIAGNOSIS — O26891 Other specified pregnancy related conditions, first trimester: Secondary | ICD-10-CM | POA: Diagnosis not present

## 2017-09-21 DIAGNOSIS — R51 Headache: Secondary | ICD-10-CM | POA: Diagnosis not present

## 2017-09-21 DIAGNOSIS — I4891 Unspecified atrial fibrillation: Secondary | ICD-10-CM | POA: Diagnosis not present

## 2017-09-21 DIAGNOSIS — R200 Anesthesia of skin: Secondary | ICD-10-CM | POA: Diagnosis not present

## 2017-09-21 NOTE — Telephone Encounter (Signed)
Patient states she is having chills and is on her way to the ER.  Has not checked her temp at home.  No other symptoms noted.  Also states she has tried logging blood sugars in babyscripts, but she is not able to.  Will contact customer service to see if they can help her.

## 2017-09-21 NOTE — Telephone Encounter (Signed)
Pt is 13 weeks and needs to talk to a nurse about having chills, and hands get numb

## 2017-09-23 ENCOUNTER — Encounter: Payer: Self-pay | Admitting: Women's Health

## 2017-09-23 ENCOUNTER — Ambulatory Visit (INDEPENDENT_AMBULATORY_CARE_PROVIDER_SITE_OTHER): Payer: BLUE CROSS/BLUE SHIELD

## 2017-09-23 ENCOUNTER — Ambulatory Visit (INDEPENDENT_AMBULATORY_CARE_PROVIDER_SITE_OTHER): Payer: BLUE CROSS/BLUE SHIELD | Admitting: Women's Health

## 2017-09-23 VITALS — BP 111/75 | HR 84 | Wt 176.0 lb

## 2017-09-23 DIAGNOSIS — O0990 Supervision of high risk pregnancy, unspecified, unspecified trimester: Secondary | ICD-10-CM

## 2017-09-23 DIAGNOSIS — Z1389 Encounter for screening for other disorder: Secondary | ICD-10-CM

## 2017-09-23 DIAGNOSIS — Z3A13 13 weeks gestation of pregnancy: Secondary | ICD-10-CM

## 2017-09-23 DIAGNOSIS — O0991 Supervision of high risk pregnancy, unspecified, first trimester: Secondary | ICD-10-CM

## 2017-09-23 DIAGNOSIS — O24111 Pre-existing diabetes mellitus, type 2, in pregnancy, first trimester: Secondary | ICD-10-CM

## 2017-09-23 DIAGNOSIS — Z331 Pregnant state, incidental: Secondary | ICD-10-CM

## 2017-09-23 DIAGNOSIS — Z3682 Encounter for antenatal screening for nuchal translucency: Secondary | ICD-10-CM | POA: Diagnosis not present

## 2017-09-23 DIAGNOSIS — O99411 Diseases of the circulatory system complicating pregnancy, first trimester: Secondary | ICD-10-CM

## 2017-09-23 DIAGNOSIS — I4891 Unspecified atrial fibrillation: Secondary | ICD-10-CM | POA: Insufficient documentation

## 2017-09-23 LAB — POCT URINALYSIS DIPSTICK OB
Blood, UA: NEGATIVE
Glucose, UA: NEGATIVE — AB
Ketones, UA: NEGATIVE
Leukocytes, UA: NEGATIVE
Nitrite, UA: NEGATIVE
POC,PROTEIN,UA: NEGATIVE

## 2017-09-23 MED ORDER — METFORMIN HCL 1000 MG PO TABS: 1000.0000 mg | ORAL_TABLET | Freq: Two times a day (BID) | ORAL | 6 refills | Status: DC

## 2017-09-23 NOTE — Progress Notes (Addendum)
HIGH-RISK PREGNANCY VISIT Patient name: Jessica Carey MRN [id number]  Date of birth: [birth year] Chief Complaint:   Routine Prenatal Visit (IT 1 today)  History of Present Illness:   Jessica Carey is a 32 y.o. 701P0000 female at 2125w2d with an Estimated Date of Delivery: 03/29/18 being seen today for ongoing management of a high-risk pregnancy complicated by Type2DM uncontrolled.  Today she reports FBS 87-116 (only 1 that is <95), 2hr pp79-177 (most >120) . Has never seen dietician. States she is eating a lot of carbs. Is not taking baby ASA. [facility], diagnosed w/ Afib via EKG-has appt w/ cardiologist tomorrow. Feels fine today.  . Vag. Bleeding: None.  Movement: Absent. denies leaking of fluid.  Review of Systems:   Pertinent items are noted in HPI Denies abnormal vaginal discharge w/ itching/odor/irritation, headaches, visual changes, shortness of breath, chest pain, abdominal pain, severe nausea/vomiting, or problems with urination or bowel movements unless otherwise stated above. Pertinent History Reviewed:  Reviewed past medical,surgical, social, obstetrical and family history.  Reviewed problem list, medications and allergies. Physical Assessment:   Vitals:   09/23/17 1000  BP: 111/75  Pulse: 84  Weight: 176 lb (79.8 kg)  Body mass index is 30.21 kg/m.           Physical Examination:   General appearance: alert, well appearing, and in no distress  Mental status: alert, oriented to person, place, and time  Skin: warm & dry   Extremities: Edema: None    Cardiovascular: normal heart rate noted  Respiratory: normal respiratory effort, no distress  Abdomen: gravid, soft, non-tender  Pelvic: Cervical exam deferred         Fetal Status: Fetal Heart Rate (bpm): 135 u/s   Movement: Absent    Fetal Surveillance Testing today: US 13+2 wks,measurements c/w dates,CRL 67.65 mm,normal ovaries bilat,NB present,NT 1.6 mm,fhr 135 bpm,anterior placenta gr  0  Results for orders placed or performed in visit on 09/23/17 (from the past 24 hour(s))  POC Urinalysis Dipstick OB   Collection Time: 09/23/17 10:05 AM  Result Value Ref Range   Color, UA     Clarity, UA     Glucose, UA Negative (A) (none)   Bilirubin, UA     Ketones, UA neg    Spec Grav, UA     Blood, UA neg    pH, UA     POC Protein UA Negative Negative, Trace   Urobilinogen, UA     Nitrite, UA neg    Leukocytes, UA Negative Negative   Appearance     Odor      Assessment & Plan:  1) High-risk pregnancy G1P0000 at 5325w2d with an Estimated Date of Delivery: 03/29/18   2) Type2DM, unstable, increase metformin to 1,000mg  BID. Cut back carbs/increase protein. Referral to dietician ordered today-let us know if doesn't hear from them w/ in 1wk. Start baby ASA today. Discussed importance of strict glycemic control and adherence to low carb diet during pregnancy as well as potential complications from uncontrolled diabetes during pregnancy.  3) [facility], [facility], start baby ASA, keep appt w/ cardiology tomorrow. Discussed w/ JVF, contemplating Lovenox- will discuss w/ Dr. Diona BrownerMcDowell  Meds:  Meds ordered this encounter  Medications  . metFORMIN (GLUCOPHAGE) 1000 MG tablet    Sig: Take 1 tablet (1,000 mg total) by mouth 2 (two) times daily with a meal.    Dispense:  60 tablet    Refill:  6  Order Specific Question:   Supervising Provider    Answer:   Lazaro ArmsEURE, LUTHER H [[id number]]    Labs/procedures today: nt/it  Treatment Plan:  Growth u/s @ 20, 24, 28, 32, 35, 38wks      Fetal echo 24-28wks:_____    2x/wk testing @ 32wks or weekly BPP      Deliver @ 39wks:_____   Reviewed: Preterm labor symptoms and general obstetric precautions including but not limited to vaginal bleeding, contractions, leaking of fluid and fetal movement were reviewed in detail with the patient.  All questions were answered.  Follow-up: Return in about 2 weeks (around 10/07/2017) for HROB.  Orders Placed  This Encounter  Procedures  . Integrated 1  . Referral to Nutrition and Diabetes Services  . POC Urinalysis Dipstick OB   Cheral MarkerKimberly R Parrish Bonn CNM, [facility]NP-BC 09/23/2017 11:27 AM

## 2017-09-23 NOTE — Patient Instructions (Addendum)
Jessica Carey, I greatly value your feedback.  If you receive a survey following your visit with [place] today, we appreciate you taking the time to fill it out.  Thanks, Joellyn Haff, CNM, WHNP-BC  Increase metformin to 1,000mg  twice daily (take 2 500mg  tablets until you run out of what you have). I am sending in a new prescription today  Begin taking a 81mg  baby aspirin daily to decrease risk of preeclampsia during pregnancy    Second Trimester of Pregnancy The second trimester is from week 14 through week 27 (months 4 through 6). The second trimester is often a time when you feel your best. Your body has adjusted to being pregnant, and you begin to feel better physically. Usually, morning sickness has lessened or quit completely, you may have more energy, and you may have an increase in appetite. The second trimester is also a time when the fetus is growing rapidly. At the end of the sixth month, the fetus is about 9 inches long and weighs about 1 pounds. You will likely begin to feel the baby move (quickening) between 16 and 20 weeks of pregnancy. Body changes during your second trimester Your body continues to go through many changes during your second trimester. The changes vary from woman to woman.  Your weight will continue to increase. You will notice your lower abdomen bulging out.  You may begin to get stretch marks on your hips, abdomen, and breasts.  You may develop headaches that can be relieved by medicines. The medicines should be approved by your health care provider.  You may urinate more often because the fetus is pressing on your bladder.  You may develop or continue to have heartburn as a result of your pregnancy.  You may develop constipation because certain hormones are causing the muscles that push waste through your intestines to slow down.  You may develop hemorrhoids or swollen, bulging veins (varicose veins).  You may have back pain. This is caused by: ? Weight  gain. ? Pregnancy hormones that are relaxing the joints in your pelvis. ? A shift in weight and the muscles that support your balance.  Your breasts will continue to grow and they will continue to become tender.  Your gums may bleed and may be sensitive to brushing and flossing.  Dark spots or blotches (chloasma, mask of pregnancy) may develop on your face. This will likely fade after the baby is born.  A dark line from your belly button to the pubic area (linea nigra) may appear. This will likely fade after the baby is born.  You may have changes in your hair. These can include thickening of your hair, rapid growth, and changes in texture. Some women also have hair loss during or after pregnancy, or hair that feels dry or thin. Your hair will most likely return to normal after your baby is born.  What to expect at prenatal visits During a routine prenatal visit:  You will be weighed to make sure you and the fetus are growing normally.  Your blood pressure will be taken.  Your abdomen will be measured to track your baby's growth.  The fetal heartbeat will be listened to.  Any test results from the previous visit will be discussed.  Your health care provider may ask you:  How you are feeling.  If you are feeling the baby move.  If you have had any abnormal symptoms, such as leaking fluid, bleeding, severe headaches, or abdominal cramping.  If you are  using any tobacco products, including cigarettes, chewing tobacco, and electronic cigarettes.  If you have any questions.  Other tests that may be performed during your second trimester include:  Blood tests that check for: ? Low iron levels (anemia). ? High blood sugar that affects pregnant women (gestational diabetes) between 4124 and 28 weeks. ? Rh antibodies. This is to check for a protein on red blood cells (Rh factor).  Urine tests to check for infections, diabetes, or protein in the urine.  An ultrasound to confirm the  proper growth and development of the baby.  An amniocentesis to check for possible genetic problems.  Fetal screens for spina bifida and Down syndrome.  HIV (human immunodeficiency virus) testing. Routine prenatal testing includes screening for HIV, unless you choose not to have this test.  Follow these instructions at home: Medicines  Follow your health care provider's instructions regarding medicine use. Specific medicines may be either safe or unsafe to take during pregnancy.  Take a prenatal vitamin that contains at least 600 micrograms (mcg) of folic acid.  If you develop constipation, try taking a stool softener if your health care provider approves. Eating and drinking  Eat a balanced diet that includes fresh fruits and vegetables, whole grains, good sources of protein such as meat, eggs, or tofu, and low-fat dairy. Your health care provider will help you determine the amount of weight gain that is right for you.  Avoid raw meat and uncooked cheese. These carry germs that can cause birth defects in the baby.  If you have low calcium intake from food, talk to your health care provider about whether you should take a daily calcium supplement.  Limit foods that are high in fat and processed sugars, such as fried and sweet foods.  To prevent constipation: ? Drink enough fluid to keep your urine clear or pale yellow. ? Eat foods that are high in fiber, such as fresh fruits and vegetables, whole grains, and beans. Activity  Exercise only as directed by your health care provider. Most women can continue their usual exercise routine during pregnancy. Try to exercise for 30 minutes at least 5 days a week. Stop exercising if you experience uterine contractions.  Avoid heavy lifting, wear low heel shoes, and practice good posture.  A sexual relationship may be continued unless your health care provider directs you otherwise. Relieving pain and discomfort  Wear a good support bra to  prevent discomfort from breast tenderness.  Take warm sitz baths to soothe any pain or discomfort caused by hemorrhoids. Use hemorrhoid cream if your health care provider approves.  Rest with your legs elevated if you have leg cramps or low back pain.  If you develop varicose veins, wear support hose. Elevate your feet for 15 minutes, 3-4 times a day. Limit salt in your diet. Prenatal Care  Write down your questions. Take them to your prenatal visits.  Keep all your prenatal visits as told by your health care provider. This is important. Safety  Wear your seat belt at all times when driving.  Make a list of emergency phone numbers, including numbers for family, friends, the hospital, and police and fire departments. General instructions  Ask your health care provider for a referral to a local prenatal education class. Begin classes no later than the beginning of month 6 of your pregnancy.  Ask for help if you have counseling or nutritional needs during pregnancy. Your health care provider can offer advice or refer you to specialists for help  with various needs.  Do not use hot tubs, steam rooms, or saunas.  Do not douche or use tampons or scented sanitary pads.  Do not cross your legs for long periods of time.  Avoid cat litter boxes and soil used by cats. These carry germs that can cause birth defects in the baby and possibly loss of the fetus by miscarriage or stillbirth.  Avoid all smoking, herbs, alcohol, and unprescribed drugs. Chemicals in these products can affect the formation and growth of the baby.  Do not use any products that contain nicotine or tobacco, such as cigarettes and e-cigarettes. If you need help quitting, ask your health care provider.  Visit your dentist if you have not gone yet during your pregnancy. Use a soft toothbrush to brush your teeth and be gentle when you floss. Contact a health care provider if:  You have dizziness.  You have mild pelvic  cramps, pelvic pressure, or nagging pain in the abdominal area.  You have persistent nausea, vomiting, or diarrhea.  You have a bad smelling vaginal discharge.  You have pain when you urinate. Get help right away if:  You have a fever.  You are leaking fluid from your vagina.  You have spotting or bleeding from your vagina.  You have severe abdominal cramping or pain.  You have rapid weight gain or weight loss.  You have shortness of breath with chest pain.  You notice sudden or extreme swelling of your face, hands, ankles, feet, or legs.  You have not felt your baby move in over an hour.  You have severe headaches that do not go away when you take medicine.  You have vision changes. Summary  The second trimester is from week 14 through week 27 (months 4 through 6). It is also a time when the fetus is growing rapidly.  Your body goes through many changes during pregnancy. The changes vary from woman to woman.  Avoid all smoking, herbs, alcohol, and unprescribed drugs. These chemicals affect the formation and growth your baby.  Do not use any tobacco products, such as cigarettes, chewing tobacco, and e-cigarettes. If you need help quitting, ask your health care provider.  Contact your health care provider if you have any questions. Keep all prenatal visits as told by your health care provider. This is important. This information is not intended to replace advice given to you by your health care provider. Make sure you discuss any questions you have with your health care provider. Document Released: 01/14/2001 Document Revised: 06/28/2015 Document Reviewed: 03/23/2012 Elsevier Interactive Patient Education  2017 [employer]

## 2017-09-23 NOTE — Progress Notes (Signed)
US 13+2 wks,measurements c/w dates,CRL 67.65 mm,normal ovaries bilat,NB present,NT 1.6 mm,fhr 135 bpm,anterior placenta gr 0

## 2017-09-24 ENCOUNTER — Ambulatory Visit (HOSPITAL_COMMUNITY)
Admission: RE | Admit: 2017-09-24 | Discharge: 2017-09-24 | Disposition: A | Discharge status: Home / Self Care | Payer: BLUE CROSS/BLUE SHIELD | Source: Ambulatory Visit | Attending: Cardiology | Admitting: Cardiology

## 2017-09-24 ENCOUNTER — Ambulatory Visit: Payer: Self-pay | Admitting: Cardiology

## 2017-09-24 ENCOUNTER — Telehealth: Payer: Self-pay | Admitting: *Deleted

## 2017-09-24 ENCOUNTER — Ambulatory Visit: Payer: 59 | Admitting: Cardiology

## 2017-09-24 ENCOUNTER — Ambulatory Visit: Payer: BLUE CROSS/BLUE SHIELD | Admitting: Cardiology

## 2017-09-24 ENCOUNTER — Encounter: Payer: Self-pay | Admitting: Cardiology

## 2017-09-24 ENCOUNTER — Other Ambulatory Visit (HOSPITAL_COMMUNITY)
Admission: RE | Admit: 2017-09-24 | Discharge: 2017-09-24 | Disposition: A | Discharge status: Home / Self Care | Payer: BLUE CROSS/BLUE SHIELD | Source: Ambulatory Visit | Attending: Cardiology | Admitting: Cardiology

## 2017-09-24 VITALS — BP 105/78 | HR 75 | Ht 64.0 in | Wt 176.0 lb

## 2017-09-24 DIAGNOSIS — Z3A13 13 weeks gestation of pregnancy: Secondary | ICD-10-CM | POA: Diagnosis not present

## 2017-09-24 DIAGNOSIS — I4891 Unspecified atrial fibrillation: Secondary | ICD-10-CM | POA: Diagnosis not present

## 2017-09-24 DIAGNOSIS — I071 Rheumatic tricuspid insufficiency: Secondary | ICD-10-CM | POA: Insufficient documentation

## 2017-09-24 DIAGNOSIS — O24911 Unspecified diabetes mellitus in pregnancy, first trimester: Secondary | ICD-10-CM | POA: Diagnosis not present

## 2017-09-24 DIAGNOSIS — O99411 Diseases of the circulatory system complicating pregnancy, first trimester: Secondary | ICD-10-CM | POA: Diagnosis not present

## 2017-09-24 DIAGNOSIS — R011 Cardiac murmur, unspecified: Secondary | ICD-10-CM

## 2017-09-24 LAB — TSH: TSH: 0.88 u[IU]/mL (ref 0.350–4.500)

## 2017-09-24 LAB — MAGNESIUM: Magnesium: 1.8 mg/dL (ref 1.7–2.4)

## 2017-09-24 LAB — ECHOCARDIOGRAM COMPLETE
Height: 64 in
Weight: 2816 oz

## 2017-09-24 MED ORDER — LABETALOL HCL 100 MG PO TABS: ORAL_TABLET | ORAL | 0 refills | Status: DC

## 2017-09-24 NOTE — Telephone Encounter (Signed)
[facility]

## 2017-09-24 NOTE — Addendum Note (Signed)
Addended by: Abelino DerrickMCGHEE, Micholas Drumwright R on: 09/24/2017 02:36 PM   Modules accepted: Orders

## 2017-09-24 NOTE — Progress Notes (Addendum)
Clinical Summary Jessica Carey is a 32 y.o.female seen as new patient for new onset afib in the setting of 13 week pregnancy.    1. Afib - new diagnosis during recent ER visit to [facility] - patient is [redacted] weeks pregnant  - she reports prior to pregnancy intermittent episodes of palpitations. Fairly mild, though somewhat infrequently could have a more significant episode. She reports prior to pregnancy some issues with SOB. No LE edema.  - since pregnancy palpitations have increased in frequency and severity.  - seen in [facility] ER with feeling of hot flashes all over, palpitations. EKG showed afib with RVR - no known history of structural heart disease. She reports she was born outside of the [place] in [place].   - some ongoing episodes since her ER visit. She has worked to cut back on caffeine, but still drinking some.  - CHADS2Vasc score is 2 (gender, DM2). Has not been committed to anticoag as of yet.    - episode of hot flashes, hand numbness. , +palptations.  - previous palpitations infrequent, mild byut some more severe.  - some SOB before pregancy.     SH: works at [employer].  Past Medical History:  Diagnosis Date  . Boil, breast 07/04/2013  . Diabetes (HCC) 08/04/2013  . Diabetes mellitus without complication (HCC)   . Irregular periods      No Known Allergies   Current Outpatient Medications  Medication Sig Dispense Refill  . flintstones complete (FLINTSTONES) 60 MG chewable tablet Chew 1 tablet by mouth daily. (Patient not taking: Reported on 09/23/2017)    . metFORMIN (GLUCOPHAGE) 1000 MG tablet Take 1 tablet (1,000 mg total) by mouth 2 (two) times daily with a meal. 60 tablet 6  . Prenatal Vit-Fe Fumarate-FA (PNV PRENATAL PLUS MULTIVITAMIN) 27-1 MG TABS Take 1 tablet by mouth daily. 30 tablet 11   No current facility-administered medications for this visit.      Past Surgical History:  Procedure Laterality Date  . BREAST SURGERY Right    breast abcess  . SURGERY OF LIP       No Known Allergies    Family History  Problem Relation Age of Onset  . Diabetes Mother   . Diabetes Father   . Hyperlipidemia Father   . Diabetes Sister   . Cancer Maternal Aunt        liver  . Diabetes Paternal Grandmother      Social History Ms. Crowl reports that she has never smoked. She has never used smokeless tobacco. Ms. Neubert reports that she drank alcohol.   Review of Systems CONSTITUTIONAL: No weight loss, fever, chills, weakness or fatigue.  HEENT: Eyes: No visual loss, blurred vision, double vision or yellow sclerae.No hearing loss, sneezing, congestion, runny nose or sore throat.  SKIN: No rash or itching.  CARDIOVASCULAR: per hpi RESPIRATORY: per hpi  GASTROINTESTINAL: No anorexia, nausea, vomiting or diarrhea. No abdominal pain or blood.  GENITOURINARY: No burning on urination, no polyuria NEUROLOGICAL: No headache, dizziness, syncope, paralysis, ataxia, numbness or tingling in the extremities. No change in bowel or bladder control.  MUSCULOSKELETAL: No muscle, back pain, joint pain or stiffness.  LYMPHATICS: No enlarged nodes. No history of splenectomy.  PSYCHIATRIC: No history of depression or anxiety.  ENDOCRINOLOGIC: No reports of sweating, cold or heat intolerance. No polyuria or polydipsia.  Marland Kitchen   Physical Examination Vitals:   09/24/17 1304  BP: 105/78  Pulse: 75  SpO2: 98%   Vitals:  09/24/17 1304  Weight: 176 lb (79.8 kg)  Height: 5\' 4"  (1.626 m)    Gen: resting comfortably, no acute distress HEENT: no scleral icterus, pupils equal round and reactive, no palptable cervical adenopathy,  CV: irreg, 2/6 systolic murmur at apex, no jvd Resp: Clear to auscultation bilaterally GI: abdomen is soft, non-tender, non-distended, normal bowel sounds, no hepatosplenomegaly MSK: extremities are warm, no edema.  Skin: warm, no rash Neuro:  no focal deficits Psych: appropriate affect    Assessment and  Plan   1. Afib - new diagnosis, management is affected by her current pregnancy - PAF, EKG today shows SR with PACs.  - no compleltely safe effective medication options. At this time we will try using labetalol given its favor in pregnancy, use 100mg  po bid prn palpitations.   - regarding her anticoagulation her CHADS2Vasc score is 2. The 2019 ACC afib guidline update gives antiocoagulation in this setting a IIB recommendation ("prescribing anticoagulant may be considered") - we will obtain echo to evaluate for structural heart disease to help guide decision on anticoagulation. If used lovenox would be her likely best option. Not enough date on DOACs, coumadin can be teratogenic during certain periods and more difficult to regular.    2. Heart murmur - obtain echo. With her afib, born outside [place] increased risk for rheumatic heart disease   F/u pending. Will need to decide on anticoag once echo is completed. She will have echo done today.   Antoine PocheJonathan F. Geneal Huebert, M.D.  10/02/17 Addendum After discussing case with some of EP staff, and reviewing most recent guidelines would not recommend the use of anticoagulation at this time or antiplatet therapy. 2019 afib guideline update downgraded women with CHADS2Vasc score of 2 and anticoagulation to a IIB recommendation, with language indicating anticoag "may be considered" There is debate on whether gender alone imparts increased risk in absence of other differences in risk factors. Overall there is no strong recommendation for anticoag in this setting based on guidelines, in the setting of pregnancy the risk of complications is only increased. Continue prn labetalol for symptoms. Data would also argue against the use of antiplatelet therapy, with the guidelines referencing a study showing net harm in this clinical situation.    Jessica RichJonathan Keyler Hoge MD

## 2017-09-24 NOTE — Progress Notes (Signed)
*  PRELIMINARY RESULTS* Echocardiogram 2D Echocardiogram has been performed.  Stacey DrainWhite, Marquet Faircloth J 09/24/2017, 2:58 PM

## 2017-09-24 NOTE — Patient Instructions (Signed)
Medication Instructions:  Start Labetalol 100 mg - two times daily AS NEEDED FOR PALPITATIONS   Labwork: NONE  Testing/Procedures: Your physician has requested that you have an echocardiogram. Echocardiography is a painless test that uses sound waves to create images of your heart. It provides your doctor with information about the size and shape of your heart and how well your heart's chambers and valves are working. This procedure takes approximately one hour. There are no restrictions for this procedure.    Follow-Up: Your physician recommends that you schedule a follow-up appointment in: PENDING TEST RESULTS    Any Other Special Instructions Will Be Listed Below (If Applicable).     If you need a refill on your cardiac medications before your next appointment, please call your pharmacy.

## 2017-09-24 NOTE — Telephone Encounter (Signed)
-----   Message from Geraldine ContrasStephanie R Smith sent at 09/24/2017  4:37 PM EDT ----- Regarding: FW: Lovanox Contact: [phone] Would you contact doctor with information from Dr Wyline MoodBranch? Thank you   ----- Message ----- From: Antoine PocheBranch, Jonathan F, MD Sent: 09/24/2017   3:58 PM EDT To: Geraldine ContrasStephanie R Smith Subject: RE: Lovanox                                    Thanks for message, can you let them know we are doing some additional testing before deciding on lovenox. She is borderline based on current information for anticoagulation  JB ----- Message ----- From: Geraldine ContrasSmith, Stephanie R Sent: 09/23/2017  11:41 AM EDT To: Antoine PocheJonathan F Branch, MD Subject: Lovanox                                        Dr Emelda FearFerguson [facility]/ [facility] called asking to speak with you about patient you [place].   She is [redacted] weeks pregnant with Atrial Fib and wants advise about lovanox.

## 2017-09-25 LAB — INTEGRATED 1
Crown Rump Length: 67.7 mm
Gest. Age on Collection Date: 12.9 weeks
Maternal Age at EDD: 31.4 yr
Nuchal Translucency (NT): 1.6 mm
Number of Fetuses: 1
PAPP-A Value: 1249.8 ng/mL
Weight: 176 [lb_av]

## 2017-09-28 ENCOUNTER — Telehealth: Payer: Self-pay

## 2017-09-28 NOTE — Telephone Encounter (Signed)
Called pt. No answer. Left message for pt to return call.  

## 2017-09-28 NOTE — Telephone Encounter (Signed)
-----   Message from Antoine PocheJonathan F Branch, MD sent at 09/25/2017  2:55 PM EDT ----- Her labs and echo look good. She is borderline requiring a blood thinner due to her abnormal heart rhythm. These medicines can be used in pregnancy but if at all possible we try to avoid them because there is some increased risk. I am discussing her case with some of our heart rhythms specialists before deciding and will let her know early next week the final recommendation   Dominga FerryJ Branch MD

## 2017-10-02 ENCOUNTER — Telehealth: Payer: Self-pay

## 2017-10-02 NOTE — Telephone Encounter (Signed)
Patient reports no irregular heart beats nos, she verbalized understanding about not using blood thinners

## 2017-10-02 NOTE — Telephone Encounter (Signed)
-----   Message from Antoine PocheJonathan F Branch, MD sent at 10/02/2017  9:32 AM EDT ----- Please let patient know that at this time I would not recommend starting blood thinners for her. There is a small increased risk of stroke (about 2% per year) from her abnormal heart rhythm and blood thinners can lower that risk, however blood thinners also can increase the risk of bleeding and can have some increased risk regarding her current pregnancy. Its a gray area as far as the guidelines go, but in talking with some of our heart rhythm specialists at this time would not recommend starting a blood thinner. How are her symptoms of the afib doing?  Dina RichJonathan Branch MD

## 2017-10-07 ENCOUNTER — Ambulatory Visit (INDEPENDENT_AMBULATORY_CARE_PROVIDER_SITE_OTHER): Payer: BLUE CROSS/BLUE SHIELD | Admitting: Advanced Practice Midwife

## 2017-10-07 VITALS — BP 116/67 | HR 54 | Wt 176.0 lb

## 2017-10-07 DIAGNOSIS — Z1389 Encounter for screening for other disorder: Secondary | ICD-10-CM

## 2017-10-07 DIAGNOSIS — Z23 Encounter for immunization: Secondary | ICD-10-CM

## 2017-10-07 DIAGNOSIS — O99419 Diseases of the circulatory system complicating pregnancy, unspecified trimester: Secondary | ICD-10-CM

## 2017-10-07 DIAGNOSIS — I4891 Unspecified atrial fibrillation: Secondary | ICD-10-CM

## 2017-10-07 DIAGNOSIS — O24112 Pre-existing diabetes mellitus, type 2, in pregnancy, second trimester: Secondary | ICD-10-CM

## 2017-10-07 DIAGNOSIS — Z363 Encounter for antenatal screening for malformations: Secondary | ICD-10-CM

## 2017-10-07 DIAGNOSIS — Z331 Pregnant state, incidental: Secondary | ICD-10-CM

## 2017-10-07 DIAGNOSIS — Z3A15 15 weeks gestation of pregnancy: Secondary | ICD-10-CM

## 2017-10-07 DIAGNOSIS — O0990 Supervision of high risk pregnancy, unspecified, unspecified trimester: Secondary | ICD-10-CM

## 2017-10-07 LAB — POCT URINALYSIS DIPSTICK OB
Blood, UA: NEGATIVE
Glucose, UA: NEGATIVE
Ketones, UA: NEGATIVE
Leukocytes, UA: NEGATIVE
Nitrite, UA: NEGATIVE
POC,PROTEIN,UA: NEGATIVE

## 2017-10-07 MED ORDER — GLYBURIDE 2.5 MG PO TABS: 2.5000 mg | ORAL_TABLET | Freq: Every day | ORAL | 3 refills | Status: DC

## 2017-10-07 NOTE — Progress Notes (Signed)
Class

## 2017-10-07 NOTE — Progress Notes (Signed)
HIGH-RISK PREGNANCY VISIT Patient name: Jessica Carey MRN [id number]  Date of birth: [birth year] Chief Complaint:   Routine Prenatal Visit  History of Present Illness:   Jessica Carey is a 32 y.o. G25P0000 female at [redacted]w[redacted]d with an Estimated Date of Delivery: 03/29/18 being seen today for ongoing management of a high-risk pregnancy complicated by class  B DM.  Today she reports no complaints.  . Vag. Bleeding: None.  Movement: Absent. denies leaking of fluid. Left sided HAs, goes away w/ice. Had cards consult re AFib last week (sordered labetalol 100mg  prn) . No anticoag ordered. ee note in Epic).  Hasn't had any palpitations (ordered labetalol 100mg  prn) . No anticoag ordered.  Review of Systems:   Pertinent items are noted in HPI Denies abnormal vaginal discharge w/ itching/odor/irritation, headaches, visual changes, shortness of breath, chest pain, abdominal pain, severe nausea/vomiting, or problems with urination or bowel movements unless otherwise stated above.    Pertinent History Reviewed:  Medical & Surgical Hx:   Past Medical History:  Diagnosis Date  . Boil, breast 07/04/2013  . Diabetes (HCC) 08/04/2013  . Diabetes mellitus without complication (HCC)   . Irregular periods    Past Surgical History:  Procedure Laterality Date  . BREAST SURGERY Right    breast abcess  . SURGERY OF LIP     Family History  Problem Relation Age of Onset  . Diabetes Mother   . Diabetes Father   . Hyperlipidemia Father   . Diabetes Sister   . Cancer Maternal Aunt        liver  . Diabetes Paternal Grandmother     Current Outpatient Medications:  .  metFORMIN (GLUCOPHAGE) 1000 MG tablet, Take 1 tablet (1,000 mg total) by mouth 2 (two) times daily with a meal., Disp: 60 tablet, Rfl: 6 .  Prenatal Vit-Fe Fumarate-FA (PNV PRENATAL PLUS MULTIVITAMIN) 27-1 MG TABS, Take 1 tablet by mouth daily., Disp: 30 tablet, Rfl: 11 .  glyBURIDE (DIABETA) 2.5 MG tablet, Take 1 tablet (2.5 mg total) by mouth at bedtime.,  Disp: 30 tablet, Rfl: 3 .  labetalol (NORMODYNE) 100 MG tablet, Take 100 mg two times daily AS NEEDED for palpitations. (Patient not taking: Reported on 10/07/2017), Disp: 180 tablet, Rfl: 0 Social History: Reviewed -  reports that she has never smoked. She has never used smokeless tobacco.   Physical Assessment:   Vitals:   10/07/17 0928  BP: 116/67  Pulse: (!) 54  Weight: 79.8 kg  Body mass index is 30.21 kg/m.           Physical Examination:   General appearance: alert, well appearing, and in no distress  Mental status: alert, oriented to person, place, and time  Skin: warm & dry   Extremities: Edema: None    Cardiovascular: normal heart rate noted (bradycardic, hasn't taken labetalol)  Respiratory: normal respiratory effort, no distress  Abdomen: gravid, soft, non-tender  Pelvic: Cervical exam deferred         Fetal Status: Fetal Heart Rate (bpm): 147   Movement: Absent    Fetal Surveillance Testing today: doppler  Results for orders placed or performed in visit on 10/07/17 (from the past 24 hour(s))  POC Urinalysis Dipstick OB   Collection Time: 10/07/17  9:32 AM  Result Value Ref Range   Color, UA     Clarity, UA     Glucose, UA Negative Negative   Bilirubin, UA     Ketones, UA neg    Spec Grav, UA  Blood, UA neg    pH, UA     POC Protein UA Negative Negative, Trace   Urobilinogen, UA     Nitrite, UA neg    Leukocytes, UA Negative Negative   Appearance     Odor      Assessment & Plan:  1) High-risk pregnancy G1P0000 at [redacted]w[redacted]d with an Estimated Date of Delivery: 03/29/18   2) Class B DM, unstable  FBS are still all high; most pp are normal. Will add glyburide 2.5mg  qhs to metformin1000mg  BID  3) atrial Fib, stable; hasn't had to take labetalol  Labs/procedures today: too early for 2nd IT  Treatment Plan:   Growth u/s @ 20, 24, 28, 32, 35, 38wks      Fetal echo 24-28wks:_____    2x/wk testing @ 32wks or weekly BPP      Deliver @ 39wks:_____    Follow-up:  Return in about 3 weeks (around 10/28/2017) for HROB, [id number].  Orders Placed This Encounter  Procedures  . Flu Vaccine QUAD 36+ mos IM (Fluarix, Quad PF)  . POC Urinalysis Dipstick OB   Jacklyn Shell CNM 10/07/2017 9:58 AM

## 2017-10-07 NOTE — Patient Instructions (Addendum)
Claudie Leach, I greatly value your feedback.  If you receive a survey following your visit with [place] today, we appreciate you taking the time to fill it out.  Thanks, Cathie Beams, CNM     Second Trimester of Pregnancy The second trimester is from week 14 through week 27 (months 4 through 6). The second trimester is often a time when you feel your best. Your body has adjusted to being pregnant, and you begin to feel better physically. Usually, morning sickness has lessened or quit completely, you may have more energy, and you may have an increase in appetite. The second trimester is also a time when the fetus is growing rapidly. At the end of the sixth month, the fetus is about 9 inches long and weighs about 1 pounds. You will likely begin to feel the baby move (quickening) between 16 and 20 weeks of pregnancy. Body changes during your second trimester Your body continues to go through many changes during your second trimester. The changes vary from woman to woman.  Your weight will continue to increase. You will notice your lower abdomen bulging out.  You may begin to get stretch marks on your hips, abdomen, and breasts.  You may develop headaches that can be relieved by medicines. The medicines should be approved by your health care provider.  You may urinate more often because the fetus is pressing on your bladder.  You may develop or continue to have heartburn as a result of your pregnancy.  You may develop constipation because certain hormones are causing the muscles that push waste through your intestines to slow down.  You may develop hemorrhoids or swollen, bulging veins (varicose veins).  You may have back pain. This is caused by: ? Weight gain. ? Pregnancy hormones that are relaxing the joints in your pelvis. ? A shift in weight and the muscles that support your balance.  Your breasts will continue to grow and they will continue to become tender.  Your gums may  bleed and may be sensitive to brushing and flossing.  Dark spots or blotches (chloasma, mask of pregnancy) may develop on your face. This will likely fade after the baby is born.  A dark line from your belly button to the pubic area (linea nigra) may appear. This will likely fade after the baby is born.  You may have changes in your hair. These can include thickening of your hair, rapid growth, and changes in texture. Some women also have hair loss during or after pregnancy, or hair that feels dry or thin. Your hair will most likely return to normal after your baby is born.  What to expect at prenatal visits During a routine prenatal visit:  You will be weighed to make sure you and the fetus are growing normally.  Your blood pressure will be taken.  Your abdomen will be measured to track your baby's growth.  The fetal heartbeat will be listened to.  Any test results from the previous visit will be discussed.  Your health care provider may ask you:  How you are feeling.  If you are feeling the baby move.  If you have had any abnormal symptoms, such as leaking fluid, bleeding, severe headaches, or abdominal cramping.  If you are using any tobacco products, including cigarettes, chewing tobacco, and electronic cigarettes.  If you have any questions.  Other tests that may be performed during your second trimester include:  Blood tests that check for: ? Low iron levels (anemia). ? High  blood sugar that affects pregnant women (gestational diabetes) between 42 and 28 weeks. ? Rh antibodies. This is to check for a protein on red blood cells (Rh factor).  Urine tests to check for infections, diabetes, or protein in the urine.  An ultrasound to confirm the proper growth and development of the baby.  An amniocentesis to check for possible genetic problems.  Fetal screens for spina bifida and Down syndrome.  HIV (human immunodeficiency virus) testing. Routine prenatal testing  includes screening for HIV, unless you choose not to have this test.  Follow these instructions at home: Medicines  Follow your health care provider's instructions regarding medicine use. Specific medicines may be either safe or unsafe to take during pregnancy.  Take a prenatal vitamin that contains at least 600 micrograms (mcg) of folic acid.  If you develop constipation, try taking a stool softener if your health care provider approves. Eating and drinking  Eat a balanced diet that includes fresh fruits and vegetables, whole grains, good sources of protein such as meat, eggs, or tofu, and low-fat dairy. Your health care provider will help you determine the amount of weight gain that is right for you.  Avoid raw meat and uncooked cheese. These carry germs that can cause birth defects in the baby.  If you have low calcium intake from food, talk to your health care provider about whether you should take a daily calcium supplement.  Limit foods that are high in fat and processed sugars, such as fried and sweet foods.  To prevent constipation: ? Drink enough fluid to keep your urine clear or pale yellow. ? Eat foods that are high in fiber, such as fresh fruits and vegetables, whole grains, and beans. Activity  Exercise only as directed by your health care provider. Most women can continue their usual exercise routine during pregnancy. Try to exercise for 30 minutes at least 5 days a week. Stop exercising if you experience uterine contractions.  Avoid heavy lifting, wear low heel shoes, and practice good posture.  A sexual relationship may be continued unless your health care provider directs you otherwise. Relieving pain and discomfort  Wear a good support bra to prevent discomfort from breast tenderness.  Take warm sitz baths to soothe any pain or discomfort caused by hemorrhoids. Use hemorrhoid cream if your health care provider approves.  Rest with your legs elevated if you have  leg cramps or low back pain.  If you develop varicose veins, wear support hose. Elevate your feet for 15 minutes, 3-4 times a day. Limit salt in your diet. Prenatal Care  Write down your questions. Take them to your prenatal visits.  Keep all your prenatal visits as told by your health care provider. This is important. Safety  Wear your seat belt at all times when driving.  Make a list of emergency phone numbers, including numbers for family, friends, the hospital, and police and fire departments. General instructions  Ask your health care provider for a referral to a local prenatal education class. Begin classes no later than the beginning of month 6 of your pregnancy.  Ask for help if you have counseling or nutritional needs during pregnancy. Your health care provider can offer advice or refer you to specialists for help with various needs.  Do not use hot tubs, steam rooms, or saunas.  Do not douche or use tampons or scented sanitary pads.  Do not cross your legs for long periods of time.  Avoid cat litter boxes and soil  used by cats. These carry germs that can cause birth defects in the baby and possibly loss of the fetus by miscarriage or stillbirth.  Avoid all smoking, herbs, alcohol, and unprescribed drugs. Chemicals in these products can affect the formation and growth of the baby.  Do not use any products that contain nicotine or tobacco, such as cigarettes and e-cigarettes. If you need help quitting, ask your health care provider.  Visit your dentist if you have not gone yet during your pregnancy. Use a soft toothbrush to brush your teeth and be gentle when you floss. Contact a health care provider if:  You have dizziness.  You have mild pelvic cramps, pelvic pressure, or nagging pain in the abdominal area.  You have persistent nausea, vomiting, or diarrhea.  You have a bad smelling vaginal discharge.  You have pain when you urinate. Get help right away if:  You  have a fever.  You are leaking fluid from your vagina.  You have spotting or bleeding from your vagina.  You have severe abdominal cramping or pain.  You have rapid weight gain or weight loss.  You have shortness of breath with chest pain.  You notice sudden or extreme swelling of your face, hands, ankles, feet, or legs.  You have not felt your baby move in over an hour.  You have severe headaches that do not go away when you take medicine.  You have vision changes. Summary  The second trimester is from week 14 through week 27 (months 4 through 6). It is also a time when the fetus is growing rapidly.  Your body goes through many changes during pregnancy. The changes vary from woman to woman.  Avoid all smoking, herbs, alcohol, and unprescribed drugs. These chemicals affect the formation and growth your baby.  Do not use any tobacco products, such as cigarettes, chewing tobacco, and e-cigarettes. If you need help quitting, ask your health care provider.  Contact your health care provider if you have any questions. Keep all prenatal visits as told by your health care provider. This is important. This information is not intended to replace advice given to you by your health care provider. Make sure you discuss any questions you have with your health care provider.      CHILDBIRTH CLASSES [phone] is the phone number for Pregnancy Classes or hospital tours at [facility].   You will be referred to  [email or web address] for more information on childbirth classes  At this site you may register for classes. You may sign up for a waiting list if classes are full. Please SIGN UP FOR THIS!.   When the waiting list becomes long, sometimes new classes can be added.   For Headaches:   Stay well hydrated, drink enough water so that your urine is clear, sometimes if you are dehydrated you  can get headaches  Eat small frequent meals and snacks, sometimes if you are hungry you can get headaches  Sometimes you get headaches during pregnancy from the pregnancy hormones  You can try tylenol (1-2 regular strength 325mg  or 1-2 extra strength 500mg ) as directed on the box. The least amount of medication that works is best.   Cool compresses (cool wet washcloth or ice pack) to area of head that is hurting  You can also try drinking a caffeinated drink to see if this will help  If not helping, try below:  For Prevention of Headaches/Migraines:  CoQ10 100mg  three times daily  Vitamin B2 400mg  daily  Magnesium Oxide 400-600mg  daily  If You Get a Bad Headache/Migraine:  Benadryl 25mg    Magnesium Oxide  1 large Gatorade  2 extra strength Tylenol (1,000mg  total)  1 cup coffee or Coke  If this doesn't help please call us @ [phone]

## 2017-10-14 ENCOUNTER — Encounter: Payer: BLUE CROSS/BLUE SHIELD | Attending: Advanced Practice Midwife | Admitting: Registered"

## 2017-10-14 DIAGNOSIS — O99810 Abnormal glucose complicating pregnancy: Secondary | ICD-10-CM

## 2017-10-14 DIAGNOSIS — O24111 Pre-existing diabetes mellitus, type 2, in pregnancy, first trimester: Secondary | ICD-10-CM | POA: Insufficient documentation

## 2017-10-14 DIAGNOSIS — Z3A13 13 weeks gestation of pregnancy: Secondary | ICD-10-CM | POA: Diagnosis not present

## 2017-10-14 DIAGNOSIS — O09891 Supervision of other high risk pregnancies, first trimester: Secondary | ICD-10-CM | POA: Diagnosis not present

## 2017-10-15 ENCOUNTER — Encounter: Payer: Self-pay | Admitting: Registered"

## 2017-10-15 NOTE — Progress Notes (Signed)
Patient was seen on 10/14/2017 for Gestational Diabetes self-management class at the [facility]. The following learning objectives were met by the patient during this course:   States the definition of Gestational Diabetes  States why dietary management is important in controlling blood glucose  Describes the effects each nutrient has on blood glucose levels  Demonstrates ability to create a balanced meal plan  Demonstrates carbohydrate counting   States when to check blood glucose levels  Demonstrates proper blood glucose monitoring techniques  States the effect of stress and exercise on blood glucose levels  States the importance of limiting caffeine and abstaining from alcohol and smoking  Blood glucose monitor given: none (patient had prior to class)   Patient instructed to monitor glucose levels: FBS: 60 - <95; 1 hour: <140; 2 hour: <120  Patient received handouts:  Nutrition Diabetes and Pregnancy, including carb counting list  Patient will be seen for follow-up as needed.

## 2017-10-28 ENCOUNTER — Other Ambulatory Visit: Payer: Self-pay

## 2017-10-28 ENCOUNTER — Ambulatory Visit (INDEPENDENT_AMBULATORY_CARE_PROVIDER_SITE_OTHER): Payer: BLUE CROSS/BLUE SHIELD

## 2017-10-28 ENCOUNTER — Ambulatory Visit (INDEPENDENT_AMBULATORY_CARE_PROVIDER_SITE_OTHER): Payer: BLUE CROSS/BLUE SHIELD | Admitting: Advanced Practice Midwife

## 2017-10-28 ENCOUNTER — Encounter: Payer: Self-pay | Admitting: Advanced Practice Midwife

## 2017-10-28 VITALS — BP 104/61 | HR 81 | Wt 178.0 lb

## 2017-10-28 DIAGNOSIS — O24112 Pre-existing diabetes mellitus, type 2, in pregnancy, second trimester: Secondary | ICD-10-CM

## 2017-10-28 DIAGNOSIS — O0992 Supervision of high risk pregnancy, unspecified, second trimester: Secondary | ICD-10-CM

## 2017-10-28 DIAGNOSIS — Z363 Encounter for antenatal screening for malformations: Secondary | ICD-10-CM

## 2017-10-28 DIAGNOSIS — Z1389 Encounter for screening for other disorder: Secondary | ICD-10-CM

## 2017-10-28 DIAGNOSIS — O0990 Supervision of high risk pregnancy, unspecified, unspecified trimester: Secondary | ICD-10-CM

## 2017-10-28 DIAGNOSIS — Z331 Pregnant state, incidental: Secondary | ICD-10-CM

## 2017-10-28 LAB — POCT URINALYSIS DIPSTICK OB
Glucose, UA: NEGATIVE
Ketones, UA: NEGATIVE
Leukocytes, UA: NEGATIVE
Nitrite, UA: NEGATIVE
POC,PROTEIN,UA: NEGATIVE

## 2017-10-28 NOTE — Progress Notes (Signed)
[place] 18+2 wks,breech,anterior pl gr 0,normal ovaries bilat,cx 2.7 cm,svp of fluid 4.4 cm,fhr 142 bpm,efw 223 g 32%,anatomy complete,no obvious abnormalities

## 2017-10-28 NOTE — Progress Notes (Signed)
HIGH-RISK PREGNANCY VISIT Patient name: Jessica Carey MRN [id number]  Date of birth: [birth year] Chief Complaint:   High Risk Gestation (u/s today)  History of Present Illness:   Jessica Carey is a 32 y.o. G26P0000 female at [redacted]w[redacted]d with an Estimated Date of Delivery: 03/29/18 being seen today for ongoing management of a high-risk pregnancy complicated by Class B DM.  Today she reports no complaints.  . Vag. Bleeding: None.  Movement: Present. denies leaking of fluid.  Review of Systems:   Pertinent items are noted in HPI Denies abnormal vaginal discharge w/ itching/odor/irritation, headaches, visual changes, shortness of breath, chest pain, abdominal pain, severe nausea/vomiting, or problems with urination or bowel movements unless otherwise stated above.    Pertinent History Reviewed:  Medical & Surgical Hx:   Past Medical History:  Diagnosis Date  . Boil, breast 07/04/2013  . Diabetes (HCC) 08/04/2013  . Diabetes mellitus without complication (HCC)   . Irregular periods    Past Surgical History:  Procedure Laterality Date  . BREAST SURGERY Right    breast abcess  . SURGERY OF LIP     Family History  Problem Relation Age of Onset  . Diabetes Mother   . Diabetes Father   . Hyperlipidemia Father   . Diabetes Sister   . Cancer Maternal Aunt        liver  . Diabetes Paternal Grandmother     Current Outpatient Medications:  .  glyBURIDE (DIABETA) 2.5 MG tablet, Take 1 tablet (2.5 mg total) by mouth at bedtime., Disp: 30 tablet, Rfl: 3 .  metFORMIN (GLUCOPHAGE) 1000 MG tablet, Take 1 tablet (1,000 mg total) by mouth 2 (two) times daily with a meal., Disp: 60 tablet, Rfl: 6 .  Prenatal Vit-Fe Fumarate-FA (PNV PRENATAL PLUS MULTIVITAMIN) 27-1 MG TABS, Take 1 tablet by mouth daily., Disp: 30 tablet, Rfl: 11 .  labetalol (NORMODYNE) 100 MG tablet, Take 100 mg two times daily AS NEEDED for palpitations. (Patient not taking: Reported on 10/07/2017), Disp: 180 tablet, Rfl: 0 Social History:  Reviewed -  reports that she has never smoked. She has never used smokeless tobacco.   Physical Assessment:   Vitals:   10/28/17 1057  BP: 104/61  Pulse: 81  Weight: 178 lb (80.7 kg)  Body mass index is 30.55 kg/m.           Physical Examination:   General appearance: alert, well appearing, and in no distress  Mental status: alert, oriented to person, place, and time  Skin: warm & dry   Extremities: Edema: None    Cardiovascular: normal heart rate noted  Respiratory: normal respiratory effort, no distress  Abdomen: gravid, soft, non-tender  Pelvic: Cervical exam deferred         Fetal Status:     Movement: Present    Fetal Surveillance Testing today: [place] 18+2 wks,breech,anterior pl gr 0,normal ovaries bilat,cx 2.7 cm,svp of fluid 4.4 cm,fhr 142 bpm,efw 223 g 32%,anatomy complete,no obvious abnormalities  Results for orders placed or performed in visit on 10/28/17 (from the past 24 hour(s))  POC Urinalysis Dipstick OB   Collection Time: 10/28/17 12:14 PM  Result Value Ref Range   Color, UA     Clarity, UA     Glucose, UA Negative Negative   Bilirubin, UA     Ketones, UA neg    Spec Grav, UA     Blood, UA trace    pH, UA     POC Protein UA Negative Negative, Trace   Urobilinogen, UA  Nitrite, UA neg    Leukocytes, UA Negative Negative   Appearance     Odor      Assessment & Plan:  1) High-risk pregnancy G1P0000 at [redacted]w[redacted]d with an Estimated Date of Delivery: 03/29/18   2) Class B DM, most FBS >95-111. All PP except 2 (120's) normal. Missing a lot of PP ("I don't always have the meter with me").  Only takes glyburide 2.5mg  hs once in a while: instructed to take it nightly. ASA 162mg  hs  3) Atrial fib:   Has prn labetalol, hasn't had to take it.   Treatment Plan:  Growth u/s @ 24, 28, 32, 35, 38wks      Fetal echo 24-28wks:_____    2x/wk testing @ 32wks or weekly BPP      Deliver @ 39wks:_____    Follow-up: Return in about 4 weeks (around 11/25/2017) for  HROB.  Orders Placed This Encounter  Procedures  . POC Urinalysis Dipstick OB   Jacklyn Shell CNM 10/29/2017 9:13 AM

## 2017-10-28 NOTE — Patient Instructions (Signed)
Jessica Carey, I greatly value your feedback.  If you receive a survey following your visit with [place] today, we appreciate you taking the time to fill it out.  Thanks, Cathie Beams, CNM     Second Trimester of Pregnancy The second trimester is from week 14 through week 27 (months 4 through 6). The second trimester is often a time when you feel your best. Your body has adjusted to being pregnant, and you begin to feel better physically. Usually, morning sickness has lessened or quit completely, you may have more energy, and you may have an increase in appetite. The second trimester is also a time when the fetus is growing rapidly. At the end of the sixth month, the fetus is about 9 inches long and weighs about 1 pounds. You will likely begin to feel the baby move (quickening) between 16 and 20 weeks of pregnancy. Body changes during your second trimester Your body continues to go through many changes during your second trimester. The changes vary from woman to woman.  Your weight will continue to increase. You will notice your lower abdomen bulging out.  You may begin to get stretch marks on your hips, abdomen, and breasts.  You may develop headaches that can be relieved by medicines. The medicines should be approved by your health care provider.  You may urinate more often because the fetus is pressing on your bladder.  You may develop or continue to have heartburn as a result of your pregnancy.  You may develop constipation because certain hormones are causing the muscles that push waste through your intestines to slow down.  You may develop hemorrhoids or swollen, bulging veins (varicose veins).  You may have back pain. This is caused by: ? Weight gain. ? Pregnancy hormones that are relaxing the joints in your pelvis. ? A shift in weight and the muscles that support your balance.  Your breasts will continue to grow and they will continue to become tender.  Your gums may  bleed and may be sensitive to brushing and flossing.  Dark spots or blotches (chloasma, mask of pregnancy) may develop on your face. This will likely fade after the baby is born.  A dark line from your belly button to the pubic area (linea nigra) may appear. This will likely fade after the baby is born.  You may have changes in your hair. These can include thickening of your hair, rapid growth, and changes in texture. Some women also have hair loss during or after pregnancy, or hair that feels dry or thin. Your hair will most likely return to normal after your baby is born.  What to expect at prenatal visits During a routine prenatal visit:  You will be weighed to make sure you and the fetus are growing normally.  Your blood pressure will be taken.  Your abdomen will be measured to track your baby's growth.  The fetal heartbeat will be listened to.  Any test results from the previous visit will be discussed.  Your health care provider may ask you:  How you are feeling.  If you are feeling the baby move.  If you have had any abnormal symptoms, such as leaking fluid, bleeding, severe headaches, or abdominal cramping.  If you are using any tobacco products, including cigarettes, chewing tobacco, and electronic cigarettes.  If you have any questions.  Other tests that may be performed during your second trimester include:  Blood tests that check for: ? Low iron levels (anemia). ? High  blood sugar that affects pregnant women (gestational diabetes) between 42 and 28 weeks. ? Rh antibodies. This is to check for a protein on red blood cells (Rh factor).  Urine tests to check for infections, diabetes, or protein in the urine.  An ultrasound to confirm the proper growth and development of the baby.  An amniocentesis to check for possible genetic problems.  Fetal screens for spina bifida and Down syndrome.  HIV (human immunodeficiency virus) testing. Routine prenatal testing  includes screening for HIV, unless you choose not to have this test.  Follow these instructions at home: Medicines  Follow your health care provider's instructions regarding medicine use. Specific medicines may be either safe or unsafe to take during pregnancy.  Take a prenatal vitamin that contains at least 600 micrograms (mcg) of folic acid.  If you develop constipation, try taking a stool softener if your health care provider approves. Eating and drinking  Eat a balanced diet that includes fresh fruits and vegetables, whole grains, good sources of protein such as meat, eggs, or tofu, and low-fat dairy. Your health care provider will help you determine the amount of weight gain that is right for you.  Avoid raw meat and uncooked cheese. These carry germs that can cause birth defects in the baby.  If you have low calcium intake from food, talk to your health care provider about whether you should take a daily calcium supplement.  Limit foods that are high in fat and processed sugars, such as fried and sweet foods.  To prevent constipation: ? Drink enough fluid to keep your urine clear or pale yellow. ? Eat foods that are high in fiber, such as fresh fruits and vegetables, whole grains, and beans. Activity  Exercise only as directed by your health care provider. Most women can continue their usual exercise routine during pregnancy. Try to exercise for 30 minutes at least 5 days a week. Stop exercising if you experience uterine contractions.  Avoid heavy lifting, wear low heel shoes, and practice good posture.  A sexual relationship may be continued unless your health care provider directs you otherwise. Relieving pain and discomfort  Wear a good support bra to prevent discomfort from breast tenderness.  Take warm sitz baths to soothe any pain or discomfort caused by hemorrhoids. Use hemorrhoid cream if your health care provider approves.  Rest with your legs elevated if you have  leg cramps or low back pain.  If you develop varicose veins, wear support hose. Elevate your feet for 15 minutes, 3-4 times a day. Limit salt in your diet. Prenatal Care  Write down your questions. Take them to your prenatal visits.  Keep all your prenatal visits as told by your health care provider. This is important. Safety  Wear your seat belt at all times when driving.  Make a list of emergency phone numbers, including numbers for family, friends, the hospital, and police and fire departments. General instructions  Ask your health care provider for a referral to a local prenatal education class. Begin classes no later than the beginning of month 6 of your pregnancy.  Ask for help if you have counseling or nutritional needs during pregnancy. Your health care provider can offer advice or refer you to specialists for help with various needs.  Do not use hot tubs, steam rooms, or saunas.  Do not douche or use tampons or scented sanitary pads.  Do not cross your legs for long periods of time.  Avoid cat litter boxes and soil  used by cats. These carry germs that can cause birth defects in the baby and possibly loss of the fetus by miscarriage or stillbirth.  Avoid all smoking, herbs, alcohol, and unprescribed drugs. Chemicals in these products can affect the formation and growth of the baby.  Do not use any products that contain nicotine or tobacco, such as cigarettes and e-cigarettes. If you need help quitting, ask your health care provider.  Visit your dentist if you have not gone yet during your pregnancy. Use a soft toothbrush to brush your teeth and be gentle when you floss. Contact a health care provider if:  You have dizziness.  You have mild pelvic cramps, pelvic pressure, or nagging pain in the abdominal area.  You have persistent nausea, vomiting, or diarrhea.  You have a bad smelling vaginal discharge.  You have pain when you urinate. Get help right away if:  You  have a fever.  You are leaking fluid from your vagina.  You have spotting or bleeding from your vagina.  You have severe abdominal cramping or pain.  You have rapid weight gain or weight loss.  You have shortness of breath with chest pain.  You notice sudden or extreme swelling of your face, hands, ankles, feet, or legs.  You have not felt your baby move in over an hour.  You have severe headaches that do not go away when you take medicine.  You have vision changes. Summary  The second trimester is from week 14 through week 27 (months 4 through 6). It is also a time when the fetus is growing rapidly.  Your body goes through many changes during pregnancy. The changes vary from woman to woman.  Avoid all smoking, herbs, alcohol, and unprescribed drugs. These chemicals affect the formation and growth your baby.  Do not use any tobacco products, such as cigarettes, chewing tobacco, and e-cigarettes. If you need help quitting, ask your health care provider.  Contact your health care provider if you have any questions. Keep all prenatal visits as told by your health care provider. This is important. This information is not intended to replace advice given to you by your health care provider. Make sure you discuss any questions you have with your health care provider.      CHILDBIRTH CLASSES [phone] is the phone number for Pregnancy Classes or hospital tours at [facility] will be referred to  [email or web address] for more information on childbirth classes  At this site you may register for classes. You may sign up for a waiting list if classes are full. Please SIGN UP FOR THIS!.   When the waiting list becomes long, sometimes new classes can be added.

## 2017-11-25 ENCOUNTER — Ambulatory Visit (INDEPENDENT_AMBULATORY_CARE_PROVIDER_SITE_OTHER): Payer: BLUE CROSS/BLUE SHIELD | Admitting: Advanced Practice Midwife

## 2017-11-25 ENCOUNTER — Encounter: Payer: Self-pay | Admitting: Advanced Practice Midwife

## 2017-11-25 VITALS — BP 103/67 | HR 93 | Wt 182.5 lb

## 2017-11-25 DIAGNOSIS — Z3A22 22 weeks gestation of pregnancy: Secondary | ICD-10-CM

## 2017-11-25 DIAGNOSIS — Z1389 Encounter for screening for other disorder: Secondary | ICD-10-CM

## 2017-11-25 DIAGNOSIS — Z363 Encounter for antenatal screening for malformations: Secondary | ICD-10-CM

## 2017-11-25 DIAGNOSIS — O24112 Pre-existing diabetes mellitus, type 2, in pregnancy, second trimester: Secondary | ICD-10-CM

## 2017-11-25 DIAGNOSIS — Z331 Pregnant state, incidental: Secondary | ICD-10-CM

## 2017-11-25 DIAGNOSIS — O0992 Supervision of high risk pregnancy, unspecified, second trimester: Secondary | ICD-10-CM

## 2017-11-25 LAB — POCT URINALYSIS DIPSTICK OB
Glucose, UA: NEGATIVE
Ketones, UA: NEGATIVE
Leukocytes, UA: NEGATIVE
Nitrite, UA: NEGATIVE
POC,PROTEIN,UA: NEGATIVE

## 2017-11-25 NOTE — Progress Notes (Signed)
HIGH-RISK PREGNANCY VISIT Patient name: Jessica Carey MRN [id number]  Date of birth: 1985/02/14 Chief Complaint:   High Risk Gestation  History of Present Illness:   Jessica Carey is a 32 y.o. G63P0000 female at [redacted]w[redacted]d with an Estimated Date of Delivery: 03/29/18 being seen today for ongoing management of a high-risk pregnancy complicated by class  B DM.  Today she reports no complaints.  . Vag. Bleeding: None.  Movement: Present. denies leaking of fluid. 2nd IT not drawn, now too late.  Will get CfDNA Review of Systems:   Pertinent items are noted in HPI Denies abnormal vaginal discharge w/ itching/odor/irritation, headaches, visual changes, shortness of breath, chest pain, abdominal pain, severe nausea/vomiting, or problems with urination or bowel movements unless otherwise stated above.    Pertinent History Reviewed:  Medical & Surgical Hx:   Past Medical History:  Diagnosis Date  . Boil, breast 07/04/2013  . Diabetes (HCC) 08/04/2013  . Diabetes mellitus without complication (HCC)   . Irregular periods    Past Surgical History:  Procedure Laterality Date  . BREAST SURGERY Right    breast abcess  . SURGERY OF LIP     Family History  Problem Relation Age of Onset  . Diabetes Mother   . Diabetes Father   . Hyperlipidemia Father   . Diabetes Sister   . Cancer Maternal Aunt        liver  . Diabetes Paternal Grandmother     Current Outpatient Medications:  .  glyBURIDE (DIABETA) 2.5 MG tablet, Take 1 tablet (2.5 mg total) by mouth at bedtime., Disp: 30 tablet, Rfl: 3 .  labetalol (NORMODYNE) 100 MG tablet, Take 100 mg two times daily AS NEEDED for palpitations., Disp: 180 tablet, Rfl: 0 .  metFORMIN (GLUCOPHAGE) 1000 MG tablet, Take 1 tablet (1,000 mg total) by mouth 2 (two) times daily with a meal., Disp: 60 tablet, Rfl: 6 .  Prenatal Vit-Fe Fumarate-FA (PNV PRENATAL PLUS MULTIVITAMIN) 27-1 MG TABS, Take 1 tablet by mouth daily., Disp: 30 tablet, Rfl: 11 Social History: Reviewed -   reports that she has never smoked. She has never used smokeless tobacco.   Physical Assessment:   Vitals:   11/25/17 1025  BP: 103/67  Pulse: 93  Weight: 182 lb 8 oz (82.8 kg)  Body mass index is 31.33 kg/m.           Physical Examination:   General appearance: alert, well appearing, and in no distress  Mental status: alert, oriented to person, place, and time  Skin: warm & dry   Extremities: Edema: Trace    Cardiovascular: normal heart rate noted  Respiratory: normal respiratory effort, no distress  Abdomen: gravid, soft, non-tender  Pelvic: Cervical exam deferred         Fetal Status: Fetal Heart Rate (bpm): 150 [place]   Movement: Present    Fetal Surveillance Testing today: doppler  Results for orders placed or performed in visit on 11/25/17 (from the past 24 hour(s))  POC Urinalysis Dipstick OB   Collection Time: 11/25/17 10:27 AM  Result Value Ref Range   Color, UA     Clarity, UA     Glucose, UA Negative Negative   Bilirubin, UA     Ketones, UA neg    Spec Grav, UA     Blood, UA trace    pH, UA     POC Protein UA Negative Negative, Trace   Urobilinogen, UA     Nitrite, UA neg    Leukocytes, UA  Negative Negative   Appearance     Odor      Assessment & Plan:  1) High-risk pregnancy G1P0000 at [redacted]w[redacted]d with an Estimated Date of Delivery: 03/29/18   2) Class B DM:  Only occ 2hr abnormal; all fastings normal now, Continue Metformin 1000mg  BID and glyburide 25mg  q hs.    3) A FIB, stable;  Hasn't had to take labetalol  Labs/procedures today: Mat 21  Treatment Plan:  Growth u/s @ 24, 28, 32, 35, 38wks Fetal echo 211/7 @ 0800.  @ 32wk weekly BPP   Follow-up: Return in about 2 weeks (around 12/09/2017) for HROB, US:EFW.  Orders Placed This Encounter  Procedures  . MaterniT21 PLUS Core  . POC Urinalysis Dipstick OB   Jacklyn Shell CNM 11/25/2017 3:02 PM

## 2017-11-25 NOTE — Patient Instructions (Signed)
Jessica Carey, I greatly value your feedback.  If you receive a survey following your visit with us today, we appreciate you taking the time to fill it out.  Thanks, Jessica Carey, CNM   Call the office [phone]) or go to [facility] if:  You begin to have strong, frequent contractions  Your water breaks.  Sometimes it is a big gush of fluid, sometimes it is just a trickle that keeps getting your panties wet or running down your legs  You have vaginal bleeding.  It is normal to have a small amount of spotting if your cervix was checked.   You don't feel your baby moving like normal.  If you don't, get you something to eat and drink and lay down and focus on feeling your baby move.  You should feel at least 10 movements in 2 hours.  If you don't, you should call the office or go to [facility].    Tdap Vaccine  It is recommended that you get the Tdap vaccine during the third trimester of EACH pregnancy to help protect your baby from getting pertussis (whooping cough)  27-36 weeks is the BEST time to do this so that you can pass the protection on to your baby. During pregnancy is better than after pregnancy, but if you are unable to get it during pregnancy it will be offered at the hospital.   You will be offered this vaccine in the office after 27 weeks. If you do not have health insurance, you can get this vaccine at the health department or your family doctor  Everyone who will be around your baby should also be up-to-date on their vaccines. Adults (who are not pregnant) only need 1 dose of Tdap during adulthood.   Third Trimester of Pregnancy The third trimester is from week 29 through week 42, months 7 through 9. The third trimester is a time when the fetus is growing rapidly. At the end of the ninth month, the fetus is about 20 inches in length and weighs 6-10 pounds.  BODY CHANGES Your body goes through many changes during pregnancy. The changes vary from woman to  woman.   Your weight will continue to increase. You can expect to gain 25-35 pounds (11-16 kg) by the end of the pregnancy.  You may begin to get stretch marks on your hips, abdomen, and breasts.  You may urinate more often because the fetus is moving lower into your pelvis and pressing on your bladder.  You may develop or continue to have heartburn as a result of your pregnancy.  You may develop constipation because certain hormones are causing the muscles that push waste through your intestines to slow down.  You may develop hemorrhoids or swollen, bulging veins (varicose veins).  You may have pelvic pain because of the weight gain and pregnancy hormones relaxing your joints between the bones in your pelvis. Backaches may result from overexertion of the muscles supporting your posture.  You may have changes in your hair. These can include thickening of your hair, rapid growth, and changes in texture. Some women also have hair loss during or after pregnancy, or hair that feels dry or thin. Your hair will most likely return to normal after your baby is born.  Your breasts will continue to grow and be tender. A yellow discharge may leak from your breasts called colostrum.  Your belly button may stick out.  You may feel short of breath because of your expanding uterus.  You  may notice the fetus "dropping," or moving lower in your abdomen.  You may have a bloody mucus discharge. This usually occurs a few days to a week before labor begins.  Your cervix becomes thin and soft (effaced) near your due date. WHAT TO EXPECT AT YOUR PRENATAL EXAMS  You will have prenatal exams every 2 weeks until week 36. Then, you will have weekly prenatal exams. During a routine prenatal visit:  You will be weighed to make sure you and the fetus are growing normally.  Your blood pressure is taken.  Your abdomen will be measured to track your baby's growth.  The fetal heartbeat will be listened  to.  Any test results from the previous visit will be discussed.  You may have a cervical check near your due date to see if you have effaced. At around 36 weeks, your caregiver will check your cervix. At the same time, your caregiver will also perform a test on the secretions of the vaginal tissue. This test is to determine if a type of bacteria, Group B streptococcus, is present. Your caregiver will explain this further. Your caregiver may ask you:  What your birth plan is.  How you are feeling.  If you are feeling the baby move.  If you have had any abnormal symptoms, such as leaking fluid, bleeding, severe headaches, or abdominal cramping.  If you have any questions. Other tests or screenings that may be performed during your third trimester include:  Blood tests that check for low iron levels (anemia).  Fetal testing to check the health, activity level, and growth of the fetus. Testing is done if you have certain medical conditions or if there are problems during the pregnancy. FALSE LABOR You may feel small, irregular contractions that eventually go away. These are called Braxton Hicks contractions, or false labor. Contractions may last for hours, days, or even weeks before true labor sets in. If contractions come at regular intervals, intensify, or become painful, it is best to be seen by your caregiver.  SIGNS OF LABOR   Menstrual-like cramps.  Contractions that are 5 minutes apart or less.  Contractions that start on the top of the uterus and spread down to the lower abdomen and back.  A sense of increased pelvic pressure or back pain.  A watery or bloody mucus discharge that comes from the vagina. If you have any of these signs before the 37th week of pregnancy, call your caregiver right away. You need to go to the hospital to get checked immediately. HOME CARE INSTRUCTIONS   Avoid all smoking, herbs, alcohol, and unprescribed drugs. These chemicals affect the  formation and growth of the baby.  Follow your caregiver's instructions regarding medicine use. There are medicines that are either safe or unsafe to take during pregnancy.  Exercise only as directed by your caregiver. Experiencing uterine cramps is a good sign to stop exercising.  Continue to eat regular, healthy meals.  Wear a good support bra for breast tenderness.  Do not use hot tubs, steam rooms, or saunas.  Wear your seat belt at all times when driving.  Avoid raw meat, uncooked cheese, cat litter boxes, and soil used by cats. These carry germs that can cause birth defects in the baby.  Take your prenatal vitamins.  Try taking a stool softener (if your caregiver approves) if you develop constipation. Eat more high-fiber foods, such as fresh vegetables or fruit and whole grains. Drink plenty of fluids to keep your urine clear  or pale yellow.  Take warm sitz baths to soothe any pain or discomfort caused by hemorrhoids. Use hemorrhoid cream if your caregiver approves.  If you develop varicose veins, wear support hose. Elevate your feet for 15 minutes, 3-4 times a day. Limit salt in your diet.  Avoid heavy lifting, wear low heal shoes, and practice good posture.  Rest a lot with your legs elevated if you have leg cramps or low back pain.  Visit your dentist if you have not gone during your pregnancy. Use a soft toothbrush to brush your teeth and be gentle when you floss.  A sexual relationship may be continued unless your caregiver directs you otherwise.  Do not travel far distances unless it is absolutely necessary and only with the approval of your caregiver.  Take prenatal classes to understand, practice, and ask questions about the labor and delivery.  Make a trial run to the hospital.  Pack your hospital bag.  Prepare the baby's nursery.  Continue to go to all your prenatal visits as directed by your caregiver. SEEK MEDICAL CARE IF:  You are unsure if you are in  labor or if your water has broken.  You have dizziness.  You have mild pelvic cramps, pelvic pressure, or nagging pain in your abdominal area.  You have persistent nausea, vomiting, or diarrhea.  You have a bad smelling vaginal discharge.  You have pain with urination. SEEK IMMEDIATE MEDICAL CARE IF:   You have a fever.  You are leaking fluid from your vagina.  You have spotting or bleeding from your vagina.  You have severe abdominal cramping or pain.  You have rapid weight loss or gain.  You have shortness of breath with chest pain.  You notice sudden or extreme swelling of your face, hands, ankles, feet, or legs.  You have not felt your baby move in over an hour.  You have severe headaches that do not go away with medicine.  You have vision changes. Document Released: 01/14/2001 Document Revised: 01/25/2013 Document Reviewed: 03/23/2012 [facility] Patient Information 2015 [place], [place]. This information is not intended to replace advice given to you by your health care provider. Make sure you discuss any questions you have with your health care provider.

## 2017-12-02 ENCOUNTER — Telehealth: Payer: Self-pay | Admitting: *Deleted

## 2017-12-02 LAB — MATERNIT 21 PLUS CORE, BLOOD
Chromosome 13: NEGATIVE
Chromosome 18: NEGATIVE
Chromosome 21: NEGATIVE
Y Chromosome: NOT DETECTED

## 2017-12-02 NOTE — Telephone Encounter (Signed)
Left message @ 1:38 pm. JSY

## 2017-12-02 NOTE — Telephone Encounter (Signed)
Pt aware genetic testing is normal. JSY

## 2017-12-09 ENCOUNTER — Ambulatory Visit (INDEPENDENT_AMBULATORY_CARE_PROVIDER_SITE_OTHER): Payer: BLUE CROSS/BLUE SHIELD

## 2017-12-09 ENCOUNTER — Ambulatory Visit (INDEPENDENT_AMBULATORY_CARE_PROVIDER_SITE_OTHER): Payer: BLUE CROSS/BLUE SHIELD | Admitting: Advanced Practice Midwife

## 2017-12-09 ENCOUNTER — Other Ambulatory Visit: Payer: Self-pay | Admitting: Advanced Practice Midwife

## 2017-12-09 VITALS — BP 112/68 | HR 86 | Wt 185.0 lb

## 2017-12-09 DIAGNOSIS — O0990 Supervision of high risk pregnancy, unspecified, unspecified trimester: Secondary | ICD-10-CM

## 2017-12-09 DIAGNOSIS — O26872 Cervical shortening, second trimester: Secondary | ICD-10-CM

## 2017-12-09 DIAGNOSIS — O26879 Cervical shortening, unspecified trimester: Secondary | ICD-10-CM

## 2017-12-09 DIAGNOSIS — Z331 Pregnant state, incidental: Secondary | ICD-10-CM

## 2017-12-09 DIAGNOSIS — Z1389 Encounter for screening for other disorder: Secondary | ICD-10-CM

## 2017-12-09 DIAGNOSIS — O0992 Supervision of high risk pregnancy, unspecified, second trimester: Secondary | ICD-10-CM

## 2017-12-09 DIAGNOSIS — O24112 Pre-existing diabetes mellitus, type 2, in pregnancy, second trimester: Secondary | ICD-10-CM

## 2017-12-09 DIAGNOSIS — Z3A24 24 weeks gestation of pregnancy: Secondary | ICD-10-CM

## 2017-12-09 LAB — POCT URINALYSIS DIPSTICK OB
Blood, UA: NEGATIVE
Glucose, UA: NEGATIVE
Ketones, UA: NEGATIVE
Leukocytes, UA: NEGATIVE
Nitrite, UA: NEGATIVE
POC,PROTEIN,UA: NEGATIVE

## 2017-12-09 MED ORDER — PROGESTERONE MICRONIZED 200 MG PO CAPS: ORAL_CAPSULE | ORAL | 5 refills | Status: DC

## 2017-12-09 NOTE — Patient Instructions (Signed)

## 2017-12-09 NOTE — Progress Notes (Signed)
[place] TA/TV: 24+2 wks,breech,short cervix w/funneling 1.24 cm w/and w/o pressure,pt wasn't able to completely empty bladder,anterior placenta gr 0,afi 11.5 cm,normal ovaries bilat,fhr 154 bpm,efw 666 g 35%,discussed results w/Fran

## 2017-12-09 NOTE — Progress Notes (Signed)
HIGH-RISK PREGNANCY VISIT Patient name: Jessica Carey MRN [id number]  Date of birth: August 28, 1985 Chief Complaint:   Routine Prenatal Visit ([place] today)  History of Present Illness:   Jessica Carey is a 32 y.o. G1P0000 female at [redacted]w[redacted]d with an Estimated Date of Delivery: 03/29/18 being seen today for ongoing management of a high-risk pregnancy complicated by class  B DM.  Today she reports FBS all <95 except 1 or 2, all 2hr PP <120 except a few. Contractions: Not present. Vag. Bleeding: None.  Movement: Present. denies leaking of fluid.  Review of Systems:   Pertinent items are noted in HPI Denies abnormal vaginal discharge w/ itching/odor/irritation, headaches, visual changes, shortness of breath, chest pain, abdominal pain, severe nausea/vomiting, or problems with urination or bowel movements unless otherwise stated above.    Pertinent History Reviewed:  Medical & Surgical Hx:   Past Medical History:  Diagnosis Date  . Boil, breast 07/04/2013  . Diabetes (HCC) 08/04/2013  . Diabetes mellitus without complication (HCC)   . Irregular periods    Past Surgical History:  Procedure Laterality Date  . BREAST SURGERY Right    breast abcess  . SURGERY OF LIP     Family History  Problem Relation Age of Onset  . Diabetes Mother   . Diabetes Father   . Hyperlipidemia Father   . Diabetes Sister   . Cancer Maternal Aunt        liver  . Diabetes Paternal Grandmother     Current Outpatient Medications:  .  glyBURIDE (DIABETA) 2.5 MG tablet, Take 1 tablet (2.5 mg total) by mouth at bedtime., Disp: 30 tablet, Rfl: 3 .  labetalol (NORMODYNE) 100 MG tablet, Take 100 mg two times daily AS NEEDED for palpitations., Disp: 180 tablet, Rfl: 0 .  metFORMIN (GLUCOPHAGE) 1000 MG tablet, Take 1 tablet (1,000 mg total) by mouth 2 (two) times daily with a meal., Disp: 60 tablet, Rfl: 6 .  Prenatal Vit-Fe Fumarate-FA (PNV PRENATAL PLUS MULTIVITAMIN) 27-1 MG TABS, Take 1 tablet by mouth daily., Disp: 30 tablet, Rfl:  11 .  progesterone (PROMETRIUM) 200 MG capsule, Place 1 tablet in vagina every night, Disp: 30 capsule, Rfl: 5 Social History: Reviewed -  reports that she has never smoked. She has never used smokeless tobacco.   Physical Assessment:   Vitals:   12/09/17 1017  BP: 112/68  Pulse: 86  Weight: 185 lb (83.9 kg)  Body mass index is 31.76 kg/m.           Physical Examination:   General appearance: alert, well appearing, and in no distress  Mental status: alert, oriented to person, place, and time  Skin: warm & dry   Extremities: Edema: Trace    Cardiovascular: normal heart rate noted  Respiratory: normal respiratory effort, no distress  Abdomen: gravid, soft, non-tender  Pelvic: Cervical exam deferred         Fetal Status:     Movement: Present    Fetal Surveillance Testing today: [place] TA/TV: 24+2 wks,breech,short cervix w/funneling 1.24 cm w/and w/o pressure,pt wasn't able to completely empty bladder,anterior placenta gr 0,afi 11.5 cm,normal ovaries bilat,fhr 154 bpm,efw 666 g 35%,  Results for orders placed or performed in visit on 12/09/17 (from the past 24 hour(s))  POC Urinalysis Dipstick OB   Collection Time: 12/09/17 10:07 AM  Result Value Ref Range   Color, UA     Clarity, UA     Glucose, UA Negative Negative   Bilirubin, UA     Ketones,  UA neg    Spec Grav, UA     Blood, UA neg    pH, UA     POC Protein UA Negative Negative, Trace   Urobilinogen, UA     Nitrite, UA neg    Leukocytes, UA Negative Negative   Appearance     Odor      Assessment & Plan:  1) High-risk pregnancy G1P0000 at [redacted]w[redacted]d with an Estimated Date of Delivery: 03/29/18   2) Class B DM, stable.  Treatment Plan: Continue Metformin 1000mg  BID w/meals and glyburide 2.5mg  qhs.  Growth [place] 28, 32, 35, 38wks      Fetal echo scheduled 11/7    2x/wk testing @ 32wks or weekly BPP      Deliver @ 39wks:_____    3) short cervix, dx today, .  Treatment Plan:  Prometrium 200mg  PV qhs.  Recheck CX length 2  weeks  Labs/procedures today: [place]   Follow-up: Return in about 2 weeks (around 12/23/2017) for cx length and HROB w/MD.  Orders Placed This Encounter  Procedures  . POC Urinalysis Dipstick OB   Jacklyn Shell CNM 12/09/2017 10:48 AM

## 2017-12-10 DIAGNOSIS — O24912 Unspecified diabetes mellitus in pregnancy, second trimester: Secondary | ICD-10-CM | POA: Diagnosis not present

## 2017-12-15 ENCOUNTER — Telehealth: Payer: Self-pay | Admitting: Obstetrics & Gynecology

## 2017-12-15 NOTE — Telephone Encounter (Signed)
Patient called, stated that she has a runny nose and sore throat.  She would like to know what she can take.  [facility] [place]  [phone]

## 2017-12-15 NOTE — Telephone Encounter (Signed)
Pt called asking what she could take for runny nose and sore throat. Advised that she could try chloraseptic throat spray, cough drops, mucinex, robitussin. Advised to call back if s/s worsen or new s/s arise. Pt verbalized understanding.

## 2017-12-23 ENCOUNTER — Ambulatory Visit (INDEPENDENT_AMBULATORY_CARE_PROVIDER_SITE_OTHER): Payer: BLUE CROSS/BLUE SHIELD

## 2017-12-23 ENCOUNTER — Ambulatory Visit (INDEPENDENT_AMBULATORY_CARE_PROVIDER_SITE_OTHER): Payer: BLUE CROSS/BLUE SHIELD | Admitting: Obstetrics and Gynecology

## 2017-12-23 ENCOUNTER — Encounter: Payer: Self-pay | Admitting: Obstetrics and Gynecology

## 2017-12-23 VITALS — BP 119/54 | HR 48 | Temp 98.6°F | Wt 188.0 lb

## 2017-12-23 DIAGNOSIS — O26872 Cervical shortening, second trimester: Secondary | ICD-10-CM | POA: Diagnosis not present

## 2017-12-23 DIAGNOSIS — O0990 Supervision of high risk pregnancy, unspecified, unspecified trimester: Secondary | ICD-10-CM | POA: Diagnosis not present

## 2017-12-23 DIAGNOSIS — O0992 Supervision of high risk pregnancy, unspecified, second trimester: Secondary | ICD-10-CM

## 2017-12-23 DIAGNOSIS — Z331 Pregnant state, incidental: Secondary | ICD-10-CM

## 2017-12-23 DIAGNOSIS — O24112 Pre-existing diabetes mellitus, type 2, in pregnancy, second trimester: Secondary | ICD-10-CM

## 2017-12-23 DIAGNOSIS — Z3A26 26 weeks gestation of pregnancy: Secondary | ICD-10-CM

## 2017-12-23 DIAGNOSIS — Z1389 Encounter for screening for other disorder: Secondary | ICD-10-CM

## 2017-12-23 LAB — POCT URINALYSIS DIPSTICK OB
Blood, UA: NEGATIVE
Glucose, UA: NEGATIVE
Ketones, UA: NEGATIVE
Leukocytes, UA: NEGATIVE
Nitrite, UA: NEGATIVE

## 2017-12-23 NOTE — Progress Notes (Signed)
Patient ID: Jessica Carey, female   DOB: Jun 07, 1985, 32 y.o.   MRN: [id number]    [facility] PREGNANCY VISIT Patient name: Jessica Carey MRN [id number]  Date of birth: [birth year] Chief Complaint:   High Risk Gestation (u/s today cervical length/ headcold x 1 week)  History of Present Illness:   Jessica Carey is a 32 y.o. G24P0000 female at [redacted]w[redacted]d with an Estimated Date of Delivery: 03/29/18 being seen today for ongoing management of a high-risk pregnancy complicated by class  B DM. Did not bring FBS with her to appointment. Has been keeping the number under 95 but since taking cough drops.  She reports that her fasting CBGs have increased as high as 110 while taking cough drops that have some sugar in them results are not able to be viewed as patient did not bring records . Has a cold for past week has not taken any medication, cough with nasal congestion and sore throat, no headache, no body ache. Was given sudafed but was unsure if she could take it while being pregnant.  Has not signed up for any birth classes as of yet. Is not partners first child but is [name]'s first child. Today she reports no complaints. Contractions: Not present.  .  Movement: Present. denies leaking of fluid.  Review of Systems:   Pertinent items are noted in HPI Denies abnormal vaginal discharge w/ itching/odor/irritation, headaches, visual changes, shortness of breath, chest pain, abdominal pain, severe nausea/vomiting, or problems with urination or bowel movements unless otherwise stated above. Pertinent History Reviewed:  Reviewed past medical,surgical, social, obstetrical and family history.  Reviewed problem list, medications and allergies. Physical Assessment:   Vitals:   12/23/17 0921  BP: (!) 119/54  Pulse: (!) 48  Temp: 98.6 F (37 C)  Weight: 188 lb (85.3 kg)  Body mass index is 32.27 kg/m.           Physical Examination:   General appearance: alert, well appearing, and in no distress and oriented to person,  place, and time  Mental status: alert, oriented to person, place, and time, normal mood, behavior, speech, dress, motor activity, and thought processes, affect appropriate to mood  Skin: warm & dry   Extremities: Edema: None    Cardiovascular: normal heart rate noted  Respiratory: normal respiratory effort, no distress  Abdomen: gravid, soft, non-tender  Pelvic: Cervical exam deferred                                   Cervix length 2.7 cm by TV u/s Fetal Status: Fetal Heart Rate (bpm): 174 u/s   Movement: Present    Fetal Surveillance Testing today: [place] TA/TV: 26+2 wks,cephalic,fht 174 bpm,normal ovaries bilat,cx length w/o pressure 2.7 cm,w/pressure 2.2 cm,anterior placenta gr 0,afi 12 cm  Results for orders placed or performed in visit on 12/23/17 (from the past 24 hour(s))  POC Urinalysis Dipstick OB   Collection Time: 12/23/17  9:25 AM  Result Value Ref Range   Color, UA     Clarity, UA     Glucose, UA Negative Negative   Bilirubin, UA     Ketones, UA neg    Spec Grav, UA     Blood, UA neg    pH, UA     POC,PROTEIN,UA Trace Negative, Trace, Small (1+), Moderate (2+), Large (3+), 4+   Urobilinogen, UA     Nitrite, UA neg    Leukocytes, UA Negative Negative  Appearance     Odor      Assessment & Plan:  1) High-risk pregnancy G1P0000 at 315w2d with an Estimated Date of Delivery: 03/29/18   2) Class B DM, stable  Meds: No orders of the defined types were placed in this encounter.  Labs/procedures today: u/s  Treatment Plan:  1. Continue Metformin 1,000 mg BID w/ meals 2. Continue Prometrium PV HS until 37 weeks 3. Take 5mg  Glyburide HS 4. F/u 2 weeks for HROB  Follow-up: Return in about 4 weeks (around 01/20/2018) for HROB.  Orders Placed This Encounter  Procedures  . POC Urinalysis Dipstick OB   By signing my name below, I, Arnette NorrisMari Johnson, attest that this documentation has been prepared under the direction and in the presence of Tilda BurrowFerguson, Javan Gonzaga V, MD. Electronically  Signed: Arnette NorrisMari Johnson Medical Scribe. 12/23/17. 9:40 AM.  I personally performed the services described in this documentation, which was SCRIBED in my presence. The recorded information has been reviewed and considered accurate. It has been edited as necessary during review. Tilda BurrowJohn V Adalyna Godbee, MD

## 2017-12-23 NOTE — Progress Notes (Signed)
US TA/TV: 26+2 wks,cephalic,fht 174 bpm,normal ovaries bilat,cx length w/o pressure 2.7 cm,w/pressure 2.2 cm,anterior placenta gr 0,afi 12 cm

## 2018-01-06 ENCOUNTER — Ambulatory Visit (INDEPENDENT_AMBULATORY_CARE_PROVIDER_SITE_OTHER): Payer: BLUE CROSS/BLUE SHIELD | Admitting: Advanced Practice Midwife

## 2018-01-06 VITALS — BP 105/61 | HR 55 | Wt 190.0 lb

## 2018-01-06 DIAGNOSIS — Z3A28 28 weeks gestation of pregnancy: Secondary | ICD-10-CM

## 2018-01-06 DIAGNOSIS — O0993 Supervision of high risk pregnancy, unspecified, third trimester: Secondary | ICD-10-CM

## 2018-01-06 DIAGNOSIS — O0990 Supervision of high risk pregnancy, unspecified, unspecified trimester: Secondary | ICD-10-CM

## 2018-01-06 DIAGNOSIS — O26873 Cervical shortening, third trimester: Secondary | ICD-10-CM

## 2018-01-06 DIAGNOSIS — O26872 Cervical shortening, second trimester: Secondary | ICD-10-CM

## 2018-01-06 DIAGNOSIS — O24113 Pre-existing diabetes mellitus, type 2, in pregnancy, third trimester: Secondary | ICD-10-CM

## 2018-01-06 NOTE — Progress Notes (Signed)
HIGH-RISK PREGNANCY VISIT Patient name: Jessica Carey MRN [id number]  Date of birth: 1985/06/29 Chief Complaint:   Routine Prenatal Visit  History of Present Illness:   Jessica Carey is a 32 y.o. 551P0000 female at 6869w2d with an Estimated Date of Delivery: 03/29/18 being seen today for ongoing management of a high-risk pregnancy complicated by class  B DM.  Today she reports FBS as low as 57 and as high as 107 (ate late, only 4 >95 and several <70) and 2 hour all but 4 <120. Marland Kitchen. Contractions: Not present. Vag. Bleeding: None.  Movement: Present. denies leaking of fluid.  Review of Systems:   Pertinent items are noted in HPI Denies abnormal vaginal discharge w/ itching/odor/irritation, headaches, visual changes, shortness of breath, chest pain, abdominal pain, severe nausea/vomiting, or problems with urination or bowel movements unless otherwise stated above.    Pertinent History Reviewed:  Medical & Surgical Hx:   Past Medical History:  Diagnosis Date  . Boil, breast 07/04/2013  . Diabetes (HCC) 08/04/2013  . Diabetes mellitus without complication (HCC)   . Irregular periods    Past Surgical History:  Procedure Laterality Date  . BREAST SURGERY Right    breast abcess  . SURGERY OF LIP     Family History  Problem Relation Age of Onset  . Diabetes Mother   . Diabetes Father   . Hyperlipidemia Father   . Diabetes Sister   . Cancer Maternal Aunt        liver  . Diabetes Paternal Grandmother     Current Outpatient Medications:  .  glyBURIDE (DIABETA) 2.5 MG tablet, Take 1 tablet (2.5 mg total) by mouth at bedtime., Disp: 30 tablet, Rfl: 3 .  labetalol (NORMODYNE) 100 MG tablet, Take 100 mg two times daily AS NEEDED for palpitations., Disp: 180 tablet, Rfl: 0 .  metFORMIN (GLUCOPHAGE) 1000 MG tablet, Take 1 tablet (1,000 mg total) by mouth 2 (two) times daily with a meal., Disp: 60 tablet, Rfl: 6 .  Prenatal Vit-Fe Fumarate-FA (PNV PRENATAL PLUS MULTIVITAMIN) 27-1 MG TABS, Take 1 tablet by  mouth daily., Disp: 30 tablet, Rfl: 11 .  progesterone (PROMETRIUM) 200 MG capsule, Place 1 tablet in vagina every night, Disp: 30 capsule, Rfl: 5 Social History: Reviewed -  reports that she has never smoked. She has never used smokeless tobacco.   Physical Assessment:   Vitals:   01/06/18 1206  BP: 105/61  Pulse: (!) 55  Weight: 190 lb (86.2 kg)  Body mass index is 32.61 kg/m.           Physical Examination:   General appearance: alert, well appearing, and in no distress  Mental status: alert, oriented to person, place, and time  Skin: warm & dry   Extremities: Edema: None    Cardiovascular: normal heart rate noted  Respiratory: normal respiratory effort, no distress  Abdomen: gravid, soft, non-tender  Pelvic: Cervical exam deferred         Fetal Status:     Movement: Present    Fetal Surveillance Testing today: dopper  No results found for this or any previous visit (from the past 24 hour(s)).  Assessment & Plan:  1) High-risk pregnancy G1P0000 at 6069w2d with an Estimated Date of Delivery: 03/29/18   2) Class B DM, stable.  Treatment Plan:  QID BS testing; US 28-32-36 weeks for growth  Twice weekly NST 32 weeks  IOL 39 weeks  Continue Metformin 1000mg  BID and gylburide 5mg  qhs.  Consider transitioning to insulin if  requirements increase.     3) Short cx, stable.  Treatment Plan:  Continue prometrium untl 37 weeks  4)  occ PVC, has had to take labetalol a few times.   Labs/procedures today: PN2 except gtt   Follow-up: Return for needs growth [place] asap; f/u 2 weeks for HROB.  Future Appointments  Date Time Provider Department Center  01/07/2018  9:30 AM [facility] [place] [facility] None  01/20/2018 11:15 AM Cresenzo-Dishmon, Scarlette Calico, CNM [facility]    Orders Placed This Encounter  Procedures  . US OB Follow Up  . CBC  . RPR  . HIV Antibody (routine testing w rflx)  . Antibody screen   Jacklyn Shell CNM 01/06/2018 1:35 PM

## 2018-01-06 NOTE — Patient Instructions (Addendum)
Jessica Carey, I greatly value your feedback.  If you receive a survey following your visit with us today, we appreciate you taking the time to fill it out.  Thanks, Cathie BeamsFran Cresenzo-Dishmon, CNM   Call the office [phone]) or go to [facility] if:  You begin to have strong, frequent contractions  Your water breaks.  Sometimes it is a big gush of fluid, sometimes it is just a trickle that keeps getting your panties wet or running down your legs  You have vaginal bleeding.  It is normal to have a small amount of spotting if your cervix was checked.   You don't feel your baby moving like normal.  If you don't, get you something to eat and drink and lay down and focus on feeling your baby move.  You should feel at least 10 movements in 2 hours.  If you don't, you should call the office or go to [facility].    Tdap Vaccine  It is recommended that you get the Tdap vaccine during the third trimester of EACH pregnancy to help protect your baby from getting pertussis (whooping cough)  27-36 weeks is the BEST time to do this so that you can pass the protection on to your baby. During pregnancy is better than after pregnancy, but if you are unable to get it during pregnancy it will be offered at the hospital.   You will be offered this vaccine in the office after 27 weeks. If you do not have health insurance, you can get this vaccine at the health department or your family doctor  Everyone who will be around your baby should also be up-to-date on their vaccines. Adults (who are not pregnant) only need 1 dose of Tdap during adulthood.   Third Trimester of Pregnancy The third trimester is from week 29 through week 42, months 7 through 9. The third trimester is a time when the fetus is growing rapidly. At the end of the ninth month, the fetus is about 20 inches in length and weighs 6-10 pounds.  BODY CHANGES Your body goes through many changes during pregnancy. The changes vary from woman to  woman.   Your weight will continue to increase. You can expect to gain 25-35 pounds (11-16 kg) by the end of the pregnancy.  You may begin to get stretch marks on your hips, abdomen, and breasts.  You may urinate more often because the fetus is moving lower into your pelvis and pressing on your bladder.  You may develop or continue to have heartburn as a result of your pregnancy.  You may develop constipation because certain hormones are causing the muscles that push waste through your intestines to slow down.  You may develop hemorrhoids or swollen, bulging veins (varicose veins).  You may have pelvic pain because of the weight gain and pregnancy hormones relaxing your joints between the bones in your pelvis. Backaches may result from overexertion of the muscles supporting your posture.  You may have changes in your hair. These can include thickening of your hair, rapid growth, and changes in texture. Some women also have hair loss during or after pregnancy, or hair that feels dry or thin. Your hair will most likely return to normal after your baby is born.  Your breasts will continue to grow and be tender. A yellow discharge may leak from your breasts called colostrum.  Your belly button may stick out.  You may feel short of breath because of your expanding uterus.  You  may notice the fetus "dropping," or moving lower in your abdomen.  You may have a bloody mucus discharge. This usually occurs a few days to a week before labor begins.  Your cervix becomes thin and soft (effaced) near your due date. WHAT TO EXPECT AT YOUR PRENATAL EXAMS  You will have prenatal exams every 2 weeks until week 36. Then, you will have weekly prenatal exams. During a routine prenatal visit:  You will be weighed to make sure you and the fetus are growing normally.  Your blood pressure is taken.  Your abdomen will be measured to track your baby's growth.  The fetal heartbeat will be listened  to.  Any test results from the previous visit will be discussed.  You may have a cervical check near your due date to see if you have effaced. At around 36 weeks, your caregiver will check your cervix. At the same time, your caregiver will also perform a test on the secretions of the vaginal tissue. This test is to determine if a type of bacteria, Group B streptococcus, is present. Your caregiver will explain this further. Your caregiver may ask you:  What your birth plan is.  How you are feeling.  If you are feeling the baby move.  If you have had any abnormal symptoms, such as leaking fluid, bleeding, severe headaches, or abdominal cramping.  If you have any questions. Other tests or screenings that may be performed during your third trimester include:  Blood tests that check for low iron levels (anemia).  Fetal testing to check the health, activity level, and growth of the fetus. Testing is done if you have certain medical conditions or if there are problems during the pregnancy. FALSE LABOR You may feel small, irregular contractions that eventually go away. These are called Braxton Hicks contractions, or false labor. Contractions may last for hours, days, or even weeks before true labor sets in. If contractions come at regular intervals, intensify, or become painful, it is best to be seen by your caregiver.  SIGNS OF LABOR   Menstrual-like cramps.  Contractions that are 5 minutes apart or less.  Contractions that start on the top of the uterus and spread down to the lower abdomen and back.  A sense of increased pelvic pressure or back pain.  A watery or bloody mucus discharge that comes from the vagina. If you have any of these signs before the 37th week of pregnancy, call your caregiver right away. You need to go to the hospital to get checked immediately. HOME CARE INSTRUCTIONS   Avoid all smoking, herbs, alcohol, and unprescribed drugs. These chemicals affect the  formation and growth of the baby.  Follow your caregiver's instructions regarding medicine use. There are medicines that are either safe or unsafe to take during pregnancy.  Exercise only as directed by your caregiver. Experiencing uterine cramps is a good sign to stop exercising.  Continue to eat regular, healthy meals.  Wear a good support bra for breast tenderness.  Do not use hot tubs, steam rooms, or saunas.  Wear your seat belt at all times when driving.  Avoid raw meat, uncooked cheese, cat litter boxes, and soil used by cats. These carry germs that can cause birth defects in the baby.  Take your prenatal vitamins.  Try taking a stool softener (if your caregiver approves) if you develop constipation. Eat more high-fiber foods, such as fresh vegetables or fruit and whole grains. Drink plenty of fluids to keep your urine clear  or pale yellow.  Take warm sitz baths to soothe any pain or discomfort caused by hemorrhoids. Use hemorrhoid cream if your caregiver approves.  If you develop varicose veins, wear support hose. Elevate your feet for 15 minutes, 3-4 times a day. Limit salt in your diet.  Avoid heavy lifting, wear low heal shoes, and practice good posture.  Rest a lot with your legs elevated if you have leg cramps or low back pain.  Visit your dentist if you have not gone during your pregnancy. Use a soft toothbrush to brush your teeth and be gentle when you floss.  A sexual relationship may be continued unless your caregiver directs you otherwise.  Do not travel far distances unless it is absolutely necessary and only with the approval of your caregiver.  Take prenatal classes to understand, practice, and ask questions about the labor and delivery.  Make a trial run to the hospital.  Pack your hospital bag.  Prepare the baby's nursery.  Continue to go to all your prenatal visits as directed by your caregiver. SEEK MEDICAL CARE IF:  You are unsure if you are in  labor or if your water has broken.  You have dizziness.  You have mild pelvic cramps, pelvic pressure, or nagging pain in your abdominal area.  You have persistent nausea, vomiting, or diarrhea.  You have a bad smelling vaginal discharge.  You have pain with urination. SEEK IMMEDIATE MEDICAL CARE IF:   You have a fever.  You are leaking fluid from your vagina.  You have spotting or bleeding from your vagina.  You have severe abdominal cramping or pain.  You have rapid weight loss or gain.  You have shortness of breath with chest pain.  You notice sudden or extreme swelling of your face, hands, ankles, feet, or legs.  You have not felt your baby move in over an hour.  You have severe headaches that do not go away with medicine.  You have vision changes. Document Released: 01/14/2001 Document Revised: 01/25/2013 Document Reviewed: 03/23/2012 [facility] Patient Information 2015 [place], [place]. This information is not intended to replace advice given to you by your health care provider. Make sure you discuss any questions you have with your health care provider.   Vagal Maneuver Techniques  There are different types of vagal maneuvers that can be used to slow a person's heart rate. There is not one maneuver which will work for all patients. In some instances, it may take a little trial and error to determine what technique works best for an individual patient. All of the procedures or techniques below may stimulate the vagal nerve.  Bearing Down: Bearing down, which medically is referred to as the Valsalva maneuver, is one of the most common ways to stimulate the vagus nerve. The patient is instructed to bear down as if they were having a bowel movement. In effect, the patient is expiring against a closed glottis. An alternative way to perform a Valsalva maneuver is to tell the patient to blow through an occluded straw for several seconds (Hold one end closed and blow through the  other). These maneuvers increase intrathoracic pressure and stimulate the vagus nerve.  Coughing: Coughing creates the same physiological response as bearing down, but some people may find it easier to perform. The cough must be forceful and sustained i.e. a single cough will likely not be effective in terminating an arrhythmia  Cold Stimulus to the Face: This technique involves emerging the face in ice cold water.  Alternative methods include placing on icepack on the face or a washcloth soaked in ice water. The cold stimuli to the face should last about 10 seconds. This type of vagal maneuver creates a physiological response similar to that which occurs if a person is submerged in cold water (diver's reflex).  Gagging: Although it may not sound pleasant, gagging also stimulates the vagus nerve and can stop an episode of SVT.  A tongue depressor is briefly inserted into the mouth, touching the back of the throat, which causes the person to reflexively gag. The gag reflex stimulates the vagus nerve.

## 2018-01-07 ENCOUNTER — Ambulatory Visit (INDEPENDENT_AMBULATORY_CARE_PROVIDER_SITE_OTHER): Payer: BLUE CROSS/BLUE SHIELD

## 2018-01-07 ENCOUNTER — Other Ambulatory Visit: Payer: Self-pay | Admitting: Advanced Practice Midwife

## 2018-01-07 ENCOUNTER — Encounter: Payer: Self-pay | Admitting: *Deleted

## 2018-01-07 ENCOUNTER — Ambulatory Visit (INDEPENDENT_AMBULATORY_CARE_PROVIDER_SITE_OTHER): Payer: BLUE CROSS/BLUE SHIELD | Admitting: *Deleted

## 2018-01-07 VITALS — BP 98/55 | HR 72

## 2018-01-07 DIAGNOSIS — O24113 Pre-existing diabetes mellitus, type 2, in pregnancy, third trimester: Secondary | ICD-10-CM

## 2018-01-07 DIAGNOSIS — O0990 Supervision of high risk pregnancy, unspecified, unspecified trimester: Secondary | ICD-10-CM

## 2018-01-07 DIAGNOSIS — O26872 Cervical shortening, second trimester: Secondary | ICD-10-CM

## 2018-01-07 DIAGNOSIS — O26879 Cervical shortening, unspecified trimester: Secondary | ICD-10-CM | POA: Diagnosis not present

## 2018-01-07 DIAGNOSIS — O0993 Supervision of high risk pregnancy, unspecified, third trimester: Secondary | ICD-10-CM

## 2018-01-07 DIAGNOSIS — O26873 Cervical shortening, third trimester: Secondary | ICD-10-CM

## 2018-01-07 LAB — ANTIBODY SCREEN: Antibody Screen: NEGATIVE

## 2018-01-07 LAB — CBC
Hematocrit: 32.8 % — ABNORMAL LOW (ref 34.0–46.6)
Hemoglobin: 11.3 g/dL (ref 11.1–15.9)
MCH: 29.7 pg (ref 26.6–33.0)
MCHC: 34.5 g/dL (ref 31.5–35.7)
MCV: 86 fL (ref 79–97)
Platelets: 176 10*3/uL (ref 150–450)
RBC: 3.8 x10E6/uL (ref 3.77–5.28)
RDW: 13.4 % (ref 12.3–15.4)
WBC: 12.7 10*3/uL — ABNORMAL HIGH (ref 3.4–10.8)

## 2018-01-07 LAB — HIV ANTIBODY (ROUTINE TESTING W REFLEX): HIV Screen 4th Generation wRfx: NONREACTIVE

## 2018-01-07 LAB — RPR: RPR Ser Ql: NONREACTIVE

## 2018-01-07 MED ORDER — BETAMETHASONE SOD PHOS & ACET 6 (3-3) MG/ML IJ SUSP
12.0000 mg | Freq: Once | INTRAMUSCULAR | Status: AC
  Administered 2018-01-07: 12 mg via INTRAMUSCULAR

## 2018-01-07 NOTE — Progress Notes (Unsigned)
Incidental finding of cx shortened again.  Discussed w/Dr. 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  BMZ today and tomorrow.  Pt aware BS will ^ (don't need to try to cover them [facility]

## 2018-01-07 NOTE — Progress Notes (Signed)
Pt received Betamethasone 12mg  in left deltoid. Pt tolerated shot well. Return in 1 day for second betamethasone shot. Pt voiced understanding. JSY

## 2018-01-07 NOTE — Progress Notes (Signed)
US 28+2 wks,cephalic,anterior placenta gr 0,normal ovaries bilat,afi 12 cm,shortened cervix w/funneling,cervical length 7-9 mm with and w/o pressure,EFW 1342 g 64%,FHR 159 BPM

## 2018-01-08 ENCOUNTER — Encounter: Payer: Self-pay | Admitting: *Deleted

## 2018-01-08 ENCOUNTER — Ambulatory Visit (INDEPENDENT_AMBULATORY_CARE_PROVIDER_SITE_OTHER): Payer: BLUE CROSS/BLUE SHIELD | Admitting: *Deleted

## 2018-01-08 VITALS — BP 90/54 | HR 117 | Wt 190.0 lb

## 2018-01-08 DIAGNOSIS — O26872 Cervical shortening, second trimester: Secondary | ICD-10-CM

## 2018-01-08 DIAGNOSIS — O0990 Supervision of high risk pregnancy, unspecified, unspecified trimester: Secondary | ICD-10-CM

## 2018-01-08 MED ORDER — BETAMETHASONE SOD PHOS & ACET 6 (3-3) MG/ML IJ SUSP
12.0000 mg | Freq: Once | INTRAMUSCULAR | Status: AC
  Administered 2018-01-08: 12 mg via INTRAMUSCULAR

## 2018-01-08 NOTE — Progress Notes (Signed)
BMZ 12mg  IM given in right ventrogluteal. Pt tolerated well.

## 2018-01-11 ENCOUNTER — Other Ambulatory Visit: Payer: Self-pay | Admitting: Women's Health

## 2018-01-11 MED ORDER — GLYBURIDE 5 MG PO TABS: 5.0000 mg | ORAL_TABLET | Freq: Every day | ORAL | 3 refills | Status: DC

## 2018-01-20 ENCOUNTER — Encounter: Payer: Self-pay | Admitting: Advanced Practice Midwife

## 2018-01-20 ENCOUNTER — Ambulatory Visit (INDEPENDENT_AMBULATORY_CARE_PROVIDER_SITE_OTHER): Payer: BLUE CROSS/BLUE SHIELD | Admitting: Advanced Practice Midwife

## 2018-01-20 ENCOUNTER — Other Ambulatory Visit: Payer: Self-pay

## 2018-01-20 VITALS — BP 95/63 | HR 101 | Wt 189.0 lb

## 2018-01-20 DIAGNOSIS — Z23 Encounter for immunization: Secondary | ICD-10-CM | POA: Diagnosis not present

## 2018-01-20 DIAGNOSIS — Z331 Pregnant state, incidental: Secondary | ICD-10-CM

## 2018-01-20 DIAGNOSIS — O24113 Pre-existing diabetes mellitus, type 2, in pregnancy, third trimester: Secondary | ICD-10-CM

## 2018-01-20 DIAGNOSIS — O0993 Supervision of high risk pregnancy, unspecified, third trimester: Secondary | ICD-10-CM

## 2018-01-20 DIAGNOSIS — Z1389 Encounter for screening for other disorder: Secondary | ICD-10-CM

## 2018-01-20 DIAGNOSIS — O26873 Cervical shortening, third trimester: Secondary | ICD-10-CM

## 2018-01-20 DIAGNOSIS — Z3A30 30 weeks gestation of pregnancy: Secondary | ICD-10-CM

## 2018-01-20 DIAGNOSIS — O26872 Cervical shortening, second trimester: Secondary | ICD-10-CM

## 2018-01-20 LAB — POCT URINALYSIS DIPSTICK OB
Blood, UA: NEGATIVE
Glucose, UA: NEGATIVE
Ketones, UA: NEGATIVE
Leukocytes, UA: NEGATIVE
Nitrite, UA: NEGATIVE

## 2018-01-20 MED ORDER — INSULIN GLARGINE 100 UNIT/ML ~~LOC~~ SOLN: 10.0000 [IU] | Freq: Every day | SUBCUTANEOUS | 11 refills | Status: DC

## 2018-01-20 NOTE — Progress Notes (Signed)
HIGH-RISK PREGNANCY VISIT Patient name: Jessica Carey Boldon MRN [id number]  Date of birth: 12/23/1985 Chief Complaint:   High Risk Gestation  History of Present Illness:   Jessica Carey Etienne is a 32 y.o. 471P0000 female at 1328w2d with an Estimated Date of Delivery: 03/29/18 being seen today for ongoing management of a high-risk pregnancy complicated by class  B DM, and a short cervix.  Today she reports All BS abnormal since getting BMZ. Contractions: Irregular. Vag. Bleeding: None.  Movement: Present. uid.  Review of Systems:   Pertinent items are noted in HPI Denies abnormal vaginal discharge w/ itching/odor/irritation, but DOES STATE D/C IS WATERY FOR 3 DAYS; NO headaches, visual changes, shortness of breath, chest pain, abdominal pain, severe nausea/vomiting, or problems with urination or bowel movements unless otherwise stated above.    Pertinent History Reviewed:  Medical & Surgical Hx:   Past Medical History:  Diagnosis Date  . Boil, breast 07/04/2013  . Diabetes (HCC) 08/04/2013  . Diabetes mellitus without complication (HCC)   . Irregular periods    Past Surgical History:  Procedure Laterality Date  . BREAST SURGERY Right    breast abcess  . SURGERY OF LIP     Family History  Problem Relation Age of Onset  . Diabetes Mother   . Diabetes Father   . Hyperlipidemia Father   . Diabetes Sister   . Cancer Maternal Aunt        liver  . Diabetes Paternal Grandmother     Current Outpatient Medications:  .  glyBURIDE (DIABETA) 5 MG tablet, Take 1 tablet (5 mg total) by mouth at bedtime., Disp: 30 tablet, Rfl: 3 .  metFORMIN (GLUCOPHAGE) 1000 MG tablet, Take 1 tablet (1,000 mg total) by mouth 2 (two) times daily with a meal., Disp: 60 tablet, Rfl: 6 .  Prenatal Vit-Fe Fumarate-FA (PNV PRENATAL PLUS MULTIVITAMIN) 27-1 MG TABS, Take 1 tablet by mouth daily., Disp: 30 tablet, Rfl: 11 .  progesterone (PROMETRIUM) 200 MG capsule, Place 1 tablet in vagina every night, Disp: 30 capsule, Rfl: 5 .   insulin glargine (LANTUS) 100 UNIT/ML injection, Inject 0.1 mLs (10 Units total) into the skin daily., Disp: 10 mL, Rfl: 11 .  labetalol (NORMODYNE) 100 MG tablet, Take 100 mg two times daily AS NEEDED for palpitations. (Patient not taking: Reported on 01/20/2018), Disp: 180 tablet, Rfl: 0 Social History: Reviewed -  reports that she has never smoked. She has never used smokeless tobacco.   Physical Assessment:   Vitals:   01/20/18 1132  BP: 95/63  Pulse: (!) 101  Weight: 189 lb (85.7 kg)  Body mass index is 32.44 kg/m.           Physical Examination:   General appearance: alert, well appearing, and in no distress  Mental status: alert, oriented to person, place, and time  Skin: warm & dry   Extremities: Edema: None    Cardiovascular: normal heart rate noted  Respiratory: normal respiratory effort, no distress  Abdomen: gravid, soft, non-tender  Pelvic: Cervical exam deferred SSE:  normal appearing discharge, neg fern, valsalva. Wet prep neg        Fetal Status: Fetal Heart Rate (bpm): 150 Fundal Height: 32 cm Movement: Present    Fetal Surveillance Testing today: doppler  Results for orders placed or performed in visit on 01/20/18 (from the past 24 hour(s))  POC Urinalysis Dipstick OB   Collection Time: 01/20/18 11:33 AM  Result Value Ref Range   Color, UA     Clarity, UA  Glucose, UA Negative Negative   Bilirubin, UA     Ketones, UA neg    Spec Grav, UA     Blood, UA neg    pH, UA     POC,PROTEIN,UA Moderate (2+) Negative, Trace, Small (1+), Moderate (2+), Large (3+), 4+   Urobilinogen, UA     Nitrite, UA neg    Leukocytes, UA Negative Negative   Appearance     Odor      Assessment & Plan:  1) High-risk pregnancy G1P0000 at [redacted]w[redacted]d with an Estimated Date of Delivery: 03/29/18   2) Class B DM, unstable.  Since getting BMZ 2 weeks ago, BS have been all over the place, generally high; of course this is expected for a short while, but not this long. Discussed w/JVF.  Treatment Plan: Lantus 10U qhs . QID BS testing; [place] 32-36 weeks for growth weekly BPP  IOL 39 weeks  Continue Metformin 1000mg  BID and gylburide 5mg  qhs.   Insulin teaching done w/K Natale Milch, RN  3) short cervix, .  Treatment Plan:  Continue prometrium, light duty at work. BMZ on 12/5&6.         4)  Atrial fib  rarely uses labetalol prn   Follow-up: Return for Mon w.MD for insulin`2 wks EFW/BPP. WIll start weekly BPPs after that (may go ahead and schedule).  No future appointments.  Orders Placed This Encounter  Procedures  . US FETAL BPP WO NON STRESS  . US OB Follow Up  . Tdap vaccine greater than or equal to 7yo IM  . POC Urinalysis Dipstick OB   Jacklyn Shell CNM 01/20/2018 12:10 PM

## 2018-01-20 NOTE — Progress Notes (Signed)
Pt shown and instructed on lantus pen use and administration. Return demonstration shown on practice model. Rotation of injection site information sheet given and explained. Pt verbalized understanding of both. Advised to call with any questions or concerns.

## 2018-01-25 ENCOUNTER — Encounter: Payer: Self-pay | Admitting: Obstetrics & Gynecology

## 2018-01-25 ENCOUNTER — Other Ambulatory Visit: Payer: Self-pay

## 2018-01-25 ENCOUNTER — Ambulatory Visit (INDEPENDENT_AMBULATORY_CARE_PROVIDER_SITE_OTHER): Payer: BLUE CROSS/BLUE SHIELD | Admitting: Obstetrics & Gynecology

## 2018-01-25 VITALS — BP 99/57 | HR 93 | Wt 193.0 lb

## 2018-01-25 DIAGNOSIS — O26873 Cervical shortening, third trimester: Secondary | ICD-10-CM

## 2018-01-25 DIAGNOSIS — O24313 Unspecified pre-existing diabetes mellitus in pregnancy, third trimester: Secondary | ICD-10-CM

## 2018-01-25 DIAGNOSIS — Z331 Pregnant state, incidental: Secondary | ICD-10-CM

## 2018-01-25 DIAGNOSIS — O0993 Supervision of high risk pregnancy, unspecified, third trimester: Secondary | ICD-10-CM

## 2018-01-25 DIAGNOSIS — Z3A31 31 weeks gestation of pregnancy: Secondary | ICD-10-CM

## 2018-01-25 DIAGNOSIS — O24319 Unspecified pre-existing diabetes mellitus in pregnancy, unspecified trimester: Secondary | ICD-10-CM

## 2018-01-25 DIAGNOSIS — Z1389 Encounter for screening for other disorder: Secondary | ICD-10-CM

## 2018-01-25 DIAGNOSIS — O26872 Cervical shortening, second trimester: Secondary | ICD-10-CM

## 2018-01-25 LAB — POCT URINALYSIS DIPSTICK OB
Blood, UA: NEGATIVE
Glucose, UA: NEGATIVE
Ketones, UA: NEGATIVE
Leukocytes, UA: NEGATIVE
Nitrite, UA: NEGATIVE
POC,PROTEIN,UA: NEGATIVE

## 2018-01-25 MED ORDER — INSULIN GLARGINE 100 UNIT/ML ~~LOC~~ SOLN: 20.0000 [IU] | Freq: Every day | SUBCUTANEOUS | 11 refills | Status: DC

## 2018-01-25 NOTE — Progress Notes (Signed)
   HIGH-RISK PREGNANCY VISIT Patient name: Jessica Carey Topel MRN [id number]  Date of birth: [birth year] Chief Complaint:   High Risk Gestation  History of Present Illness:   Jessica Carey Lamia is a 32 y.o. G1P0000 female at 31  w0d with an Estimated Date of Delivery: 03/29/18 being seen today for ongoing management of a high-risk pregnancy complicated by class  B DM.  Today she reports no complaints. Contractions: Irregular. Vag. Bleeding: None.  Movement: Present. denies leaking of fluid.  Review of Systems:   Pertinent items are noted in HPI Denies abnormal vaginal discharge w/ itching/odor/irritation, headaches, visual changes, shortness of breath, chest pain, abdominal pain, severe nausea/vomiting, or problems with urination or bowel movements unless otherwise stated above. Pertinent History Reviewed:  Reviewed past medical,surgical, social, obstetrical and family history.  Reviewed problem list, medications and allergies. Physical Assessment:   Vitals:   01/25/18 1156  BP: (!) 99/57  Pulse: 93  Weight: 193 lb (87.5 kg)  Body mass index is 33.13 kg/m.           Physical Examination:   General appearance: alert, well appearing, and in no distress  Mental status: alert, oriented to person, place, and time  Skin: warm & dry   Extremities: Edema: None    Cardiovascular: normal heart rate noted  Respiratory: normal respiratory effort, no distress  Abdomen: gravid, soft, non-tender  Pelvic: Cervical exam deferred         Fetal Status: Fetal Heart Rate (bpm): 149 Fundal Height: 34 cm Movement: Present    Fetal Surveillance Testing today: FHR 149   Results for orders placed or performed in visit on 01/25/18 (from the past 24 hour(s))  POC Urinalysis Dipstick OB   Collection Time: 01/25/18 11:55 AM  Result Value Ref Range   Color, UA     Clarity, UA     Glucose, UA Negative Negative   Bilirubin, UA     Ketones, UA neg    Spec Grav, UA     Blood, UA neg    pH, UA     POC,PROTEIN,UA  Negative Negative, Trace, Small (1+), Moderate (2+), Large (3+), 4+   Urobilinogen, UA     Nitrite, UA neg    Leukocytes, UA Negative Negative   Appearance     Odor      Assessment & Plan:  1) High-risk pregnancy G1P0000 at 31  w0d with an Estimated Date of Delivery: 03/29/18   2) Class B DM, unstable, still elevated s/p betamethasone,increase Lantus to 20 units at bedtime  3) Short cervix, stable on prometrium  Meds:  Meds ordered this encounter  Medications  . insulin glargine (LANTUS) 100 UNIT/ML injection    Sig: Inject 0.2 mLs (20 Units total) into the skin daily.    Dispense:  10 mL    Refill:  11    Labs/procedures today:   Treatment Plan:  Begin twice weeky NST next week  Reviewed: Preterm labor symptoms and general obstetric precautions including but not limited to vaginal bleeding, contractions, leaking of fluid and fetal movement were reviewed in detail with the patient.  All questions were answered.  Follow-up: Return in about 1 week (around 02/01/2018) for NST, HROB.  Orders Placed This Encounter  Procedures  . POC Urinalysis Dipstick OB   Lazaro ArmsLuther H Annesha Delgreco  01/25/2018 12:25 PM

## 2018-02-02 ENCOUNTER — Ambulatory Visit (INDEPENDENT_AMBULATORY_CARE_PROVIDER_SITE_OTHER): Payer: BLUE CROSS/BLUE SHIELD | Admitting: Obstetrics & Gynecology

## 2018-02-02 ENCOUNTER — Other Ambulatory Visit: Payer: Self-pay

## 2018-02-02 ENCOUNTER — Encounter: Payer: Self-pay | Admitting: Obstetrics & Gynecology

## 2018-02-02 VITALS — BP 101/64 | HR 47 | Wt 194.0 lb

## 2018-02-02 DIAGNOSIS — Z3A32 32 weeks gestation of pregnancy: Secondary | ICD-10-CM

## 2018-02-02 DIAGNOSIS — Z331 Pregnant state, incidental: Secondary | ICD-10-CM

## 2018-02-02 DIAGNOSIS — O0993 Supervision of high risk pregnancy, unspecified, third trimester: Secondary | ICD-10-CM | POA: Diagnosis not present

## 2018-02-02 DIAGNOSIS — O26873 Cervical shortening, third trimester: Secondary | ICD-10-CM

## 2018-02-02 DIAGNOSIS — O24313 Unspecified pre-existing diabetes mellitus in pregnancy, third trimester: Secondary | ICD-10-CM

## 2018-02-02 DIAGNOSIS — O26872 Cervical shortening, second trimester: Secondary | ICD-10-CM

## 2018-02-02 DIAGNOSIS — O24319 Unspecified pre-existing diabetes mellitus in pregnancy, unspecified trimester: Secondary | ICD-10-CM

## 2018-02-02 DIAGNOSIS — Z1389 Encounter for screening for other disorder: Secondary | ICD-10-CM

## 2018-02-02 LAB — POCT URINALYSIS DIPSTICK OB
Blood, UA: NEGATIVE
Glucose, UA: NEGATIVE
Ketones, UA: NEGATIVE
Leukocytes, UA: NEGATIVE
Nitrite, UA: NEGATIVE
POC,PROTEIN,UA: NEGATIVE

## 2018-02-02 NOTE — Progress Notes (Signed)
   HIGH-RISK PREGNANCY VISIT Patient name: Jessica LeachRosa Obyrne MRN [id number]  Date of birth: Jun 25, 1985 Chief Complaint:   High Risk Gestation (NST)  History of Present Illness:   Jessica LeachRosa Nutting is a 32 y.o. G1P0000 female at 1357w1d with an Estimated Date of Delivery: 03/29/18 being seen today for ongoing management of a high-risk pregnancy complicated by class  B DM.  Today she reports no complaints. Contractions: Not present. Vag. Bleeding: None.  Movement: Present. denies leaking of fluid.  Review of Systems:   Pertinent items are noted in HPI Denies abnormal vaginal discharge w/ itching/odor/irritation, headaches, visual changes, shortness of breath, chest pain, abdominal pain, severe nausea/vomiting, or problems with urination or bowel movements unless otherwise stated above. Pertinent History Reviewed:  Reviewed past medical,surgical, social, obstetrical and family history.  Reviewed problem list, medications and allergies. Physical Assessment:   Vitals:   02/02/18 1130  BP: 101/64  Pulse: (!) 47  Weight: 194 lb (88 kg)  Body mass index is 33.3 kg/m.           Physical Examination:   General appearance: alert, well appearing, and in no distress  Mental status: alert, oriented to person, place, and time  Skin: warm & dry   Extremities: Edema: None    Cardiovascular: normal heart rate noted  Respiratory: normal respiratory effort, no distress  Abdomen: gravid, soft, non-tender  Pelvic: Cervical exam deferred         Fetal Status:     Movement: Present    Fetal Surveillance Testing today: Reactive NST   Results for orders placed or performed in visit on 02/02/18 (from the past 24 hour(s))  POC Urinalysis Dipstick OB   Collection Time: 02/02/18 11:29 AM  Result Value Ref Range   Color, UA     Clarity, UA     Glucose, UA Negative Negative   Bilirubin, UA     Ketones, UA neg    Spec Grav, UA     Blood, UA neg    pH, UA     POC,PROTEIN,UA Negative Negative, Trace, Small (1+),  Moderate (2+), Large (3+), 4+   Urobilinogen, UA     Nitrite, UA neg    Leukocytes, UA Negative Negative   Appearance     Odor      Assessment & Plan:  1) High-risk pregnancy G1P0000 at 5457w1d with an Estimated Date of Delivery: 03/29/18   2) Class B DM, suboptimal CBG but inconsistent will not adjut at this time, continue metformin 1000 BID & glyburide 5 mg at bedtime  3) Short cervix on prometrium  Meds: No orders of the defined types were placed in this encounter.   Labs/procedures today:   Treatment Plan:  Twice weekly surveillance, IOL 39 weeks  Reviewed: Preterm labor symptoms and general obstetric precautions including but not limited to vaginal bleeding, contractions, leaking of fluid and fetal movement were reviewed in detail with the patient.  All questions were answered.  Follow-up: No follow-ups on file.  Orders Placed This Encounter  Procedures  . POC Urinalysis Dipstick OB   Lazaro ArmsLuther H Paticia Moster  02/02/2018 12:15 PM

## 2018-02-03 NOTE — L&D Delivery Note (Addendum)
Shoulder Dystocia Delivery Note At 1:59 PM a viable female was delivered via vaginal delivery with shoulder dystocia.    Delivery of the head: spontaneously, delivered OA by Dr. Peggyann Shoals, DO PGY-1 First Maneuver: McRoberts by RN and Sharen Counter, CNM Second maneuver: Suprapubic pressure by RN Third maneuver: Attempt to deliver posterior arm unsuccessful by Sharen Counter, CNM, so Shonna Chock, MD stepped in and performed Rubin's Maneuver Fourth maneuver: Antonieta Pert, Dr Ashok Pall able to extract posterior shoulder, still ineffective for delivery Fifth maneuver: Dr Jaynie Collins performed woodscrew maneuver with delivery of anterior arm   APGAR: 1, 3; weight 9 lb 10.5 oz (4380 g).   Placenta status: delivered spontaneously, intact.   Cord: 3-vessel with the following complications: avulsed after infant delivered, immediately clamped on infant and on maternal side with minimal blood loss Cord pH: 6.997  Anesthesia:  None Episiotomy:  None Lacerations:  4th-degree perineal Suture Repair: See Op Note Est. Blood Loss (mL): 527 mL  Mom to OR for surgical repair of 4th-degree laceration. Baby to NICU.   Labor progressed normally without augmentation to complete and pt pushed ~ 1 hour.  Pt without pain medication or epidural per pt preference.  Minutes before crowning, the maternal heart rate became tachycardic at 140s to 150s with short intermittent rates of 180s. The pt was stable, with normal O2 saturation, reporting mild shortness of breath only.  O2 was applied via nonrebreather mask.  Dr Ashok Pall was paged to room and telemetry monitor was applied.  Stat EKG ordered and cardiology consulted. The patient was also noted to be febrile to 100.6*F at this time. Ampicillin and Gentamicin were ordered. Ampicillin and Tylenol were given prior to delivery.  The patient pushed and the head delivered slowly over an intact perineum in the OA position. A nuchal cord was checked and none was noted. The  infant's head and shoulders did not spontaneously restitute. McRobert's and Suprapubic pressure were performed by the RN but were ineffective. Sharen Counter, CNM, attempted to deliver the posterior shoulder without success. Dr. Ashok Pall stepped in and attempted additional maneuvers. Dr. Macon Large paged to room. The patient was turned to hands and knees and posterior arm was delivered by Dr Ashok Pall. Dr. Macon Large arrived to room and used [facility] and delivery of anterior shoulder to complete delivery.   NICU team present for delivery and began resuscitation immediately.   The placenta delivered intact spontaneously. Pitocin was started IV to firm the uterus prior to placenta delivery. Cytotec was given buccally for additional bleeding prophylaxis.  Fourth-degree laceration was noted. Patient sent to OR for repair. See Op Note for details.  All sponge and needle counts were correct. Sharen Counter, CNM, Dr. Shonna Chock, MD, and Dr. Jaynie Collins, MD, were present for the entire procedure.   Sharen Counter, CNM 4:40 PM

## 2018-02-05 ENCOUNTER — Ambulatory Visit (INDEPENDENT_AMBULATORY_CARE_PROVIDER_SITE_OTHER): Payer: BLUE CROSS/BLUE SHIELD | Admitting: Obstetrics & Gynecology

## 2018-02-05 ENCOUNTER — Encounter: Payer: Self-pay | Admitting: Obstetrics & Gynecology

## 2018-02-05 VITALS — BP 99/58 | HR 48 | Wt 196.0 lb

## 2018-02-05 DIAGNOSIS — Z3A32 32 weeks gestation of pregnancy: Secondary | ICD-10-CM | POA: Diagnosis not present

## 2018-02-05 DIAGNOSIS — O26873 Cervical shortening, third trimester: Secondary | ICD-10-CM

## 2018-02-05 DIAGNOSIS — Z331 Pregnant state, incidental: Secondary | ICD-10-CM

## 2018-02-05 DIAGNOSIS — O0990 Supervision of high risk pregnancy, unspecified, unspecified trimester: Secondary | ICD-10-CM

## 2018-02-05 DIAGNOSIS — O0993 Supervision of high risk pregnancy, unspecified, third trimester: Secondary | ICD-10-CM

## 2018-02-05 DIAGNOSIS — O26872 Cervical shortening, second trimester: Secondary | ICD-10-CM

## 2018-02-05 DIAGNOSIS — O24313 Unspecified pre-existing diabetes mellitus in pregnancy, third trimester: Secondary | ICD-10-CM | POA: Diagnosis not present

## 2018-02-05 DIAGNOSIS — Z1389 Encounter for screening for other disorder: Secondary | ICD-10-CM

## 2018-02-05 DIAGNOSIS — O24319 Unspecified pre-existing diabetes mellitus in pregnancy, unspecified trimester: Secondary | ICD-10-CM

## 2018-02-05 LAB — POCT URINALYSIS DIPSTICK OB
Blood, UA: NEGATIVE
Glucose, UA: NEGATIVE
Ketones, UA: NEGATIVE
Leukocytes, UA: NEGATIVE
Nitrite, UA: NEGATIVE
POC,PROTEIN,UA: NEGATIVE

## 2018-02-05 MED ORDER — GLYBURIDE 5 MG PO TABS: 10.0000 mg | ORAL_TABLET | Freq: Every day | ORAL | 3 refills | Status: DC

## 2018-02-05 NOTE — Progress Notes (Signed)
   HIGH-RISK PREGNANCY VISIT Patient name: Jessica Carey MRN [id number]  Date of birth: 1985/07/09 Chief Complaint:   High Risk Gestation (NST)  History of Present Illness:   Jessica Carey is a 33 y.o. G1P0000 female at [redacted]w[redacted]d with an Estimated Date of Delivery: 03/29/18 being seen today for ongoing management of a high-risk pregnancy complicated by class  B DM, short cervix.  Today she reports no complaints. Contractions: Not present.  .  Movement: Present. denies leaking of fluid.  Review of Systems:   Pertinent items are noted in HPI Denies abnormal vaginal discharge w/ itching/odor/irritation, headaches, visual changes, shortness of breath, chest pain, abdominal pain, severe nausea/vomiting, or problems with urination or bowel movements unless otherwise stated above. Pertinent History Reviewed:  Reviewed past medical,surgical, social, obstetrical and family history.  Reviewed problem list, medications and allergies. Physical Assessment:   Vitals:   02/05/18 0954  BP: (!) 99/58  Pulse: (!) 48  Weight: 196 lb (88.9 kg)  Body mass index is 33.64 kg/m.           Physical Examination:   General appearance: alert, well appearing, and in no distress  Mental status: alert, oriented to person, place, and time  Skin: warm & dry   Extremities: Edema: None    Cardiovascular: normal heart rate noted  Respiratory: normal respiratory effort, no distress  Abdomen: gravid, soft, non-tender  Pelvic: Cervical exam deferred         Fetal Status:     Movement: Present    Fetal Surveillance Testing today: Reactive NST   Results for orders placed or performed in visit on 02/05/18 (from the past 24 hour(s))  POC Urinalysis Dipstick OB   Collection Time: 02/05/18 10:04 AM  Result Value Ref Range   Color, UA     Clarity, UA     Glucose, UA Negative Negative   Bilirubin, UA     Ketones, UA neg    Spec Grav, UA     Blood, UA neg    pH, UA     POC,PROTEIN,UA Negative Negative, Trace, Small (1+),  Moderate (2+), Large (3+), 4+   Urobilinogen, UA     Nitrite, UA neg    Leukocytes, UA Negative Negative   Appearance     Odor      Assessment & Plan:  1) High-risk pregnancy G1P0000 at [redacted]w[redacted]d with an Estimated Date of Delivery: 03/29/18   2) Class B DM, suboptimally controlled, Increase glyburide to 10 mg at bedtime, continue lantus 20 units at bedtime, continue metformin 1000 BID with meals  3) Short Cervix, stable, continue prometrium 200 PV qhs  Meds:  Meds ordered this encounter  Medications  . glyBURIDE (DIABETA) 5 MG tablet    Sig: Take 2 tablets (10 mg total) by mouth at bedtime.    Dispense:  60 tablet    Refill:  3    Labs/procedures today: reactive NST  Treatment Plan:  Twice weekly NST, EFW 33 36 weeks  Reviewed: Preterm labor symptoms and general obstetric precautions including but not limited to vaginal bleeding, contractions, leaking of fluid and fetal movement were reviewed in detail with the patient.  All questions were answered.  Follow-up: Return in about 3 days (around 02/08/2018) for keep scheduled.  Orders Placed This Encounter  Procedures  . POC Urinalysis Dipstick OB   Amaryllis Dyke Manon Banbury  02/05/2018 11:07 AM

## 2018-02-08 ENCOUNTER — Ambulatory Visit (INDEPENDENT_AMBULATORY_CARE_PROVIDER_SITE_OTHER): Payer: BLUE CROSS/BLUE SHIELD | Admitting: Women's Health

## 2018-02-08 ENCOUNTER — Encounter: Payer: Self-pay | Admitting: Women's Health

## 2018-02-08 ENCOUNTER — Other Ambulatory Visit: Payer: Self-pay

## 2018-02-08 ENCOUNTER — Ambulatory Visit (INDEPENDENT_AMBULATORY_CARE_PROVIDER_SITE_OTHER): Payer: BLUE CROSS/BLUE SHIELD

## 2018-02-08 VITALS — BP 117/65 | HR 92 | Wt 198.0 lb

## 2018-02-08 DIAGNOSIS — O26873 Cervical shortening, third trimester: Secondary | ICD-10-CM | POA: Diagnosis not present

## 2018-02-08 DIAGNOSIS — O0990 Supervision of high risk pregnancy, unspecified, unspecified trimester: Secondary | ICD-10-CM

## 2018-02-08 DIAGNOSIS — O0993 Supervision of high risk pregnancy, unspecified, third trimester: Secondary | ICD-10-CM | POA: Diagnosis not present

## 2018-02-08 DIAGNOSIS — O26879 Cervical shortening, unspecified trimester: Secondary | ICD-10-CM

## 2018-02-08 DIAGNOSIS — Z1389 Encounter for screening for other disorder: Secondary | ICD-10-CM

## 2018-02-08 DIAGNOSIS — O24113 Pre-existing diabetes mellitus, type 2, in pregnancy, third trimester: Secondary | ICD-10-CM | POA: Diagnosis not present

## 2018-02-08 DIAGNOSIS — Z331 Pregnant state, incidental: Secondary | ICD-10-CM

## 2018-02-08 DIAGNOSIS — O26872 Cervical shortening, second trimester: Secondary | ICD-10-CM

## 2018-02-08 DIAGNOSIS — Z3A33 33 weeks gestation of pregnancy: Secondary | ICD-10-CM

## 2018-02-08 LAB — POCT URINALYSIS DIPSTICK OB
Blood, UA: NEGATIVE
Glucose, UA: NEGATIVE
Ketones, UA: NEGATIVE
Leukocytes, UA: NEGATIVE
Nitrite, UA: NEGATIVE
POC,PROTEIN,UA: NEGATIVE

## 2018-02-08 NOTE — Patient Instructions (Signed)
Jessica Carey, I greatly value your feedback.  If you receive a survey following your visit with us today, we appreciate you taking the time to fill it out.  Thanks, Joellyn HaffKim Booker, CNM, WHNP-BC   Call the office [phone]) or go to [facility] if:  You begin to have strong, frequent contractions  Your water breaks.  Sometimes it is a big gush of fluid, sometimes it is just a trickle that keeps getting your panties wet or running down your legs  You have vaginal bleeding.  It is normal to have a small amount of spotting if your cervix was checked.   You don't feel your baby moving like normal.  If you don't, get you something to eat and drink and lay down and focus on feeling your baby move.  You should feel at least 10 movements in 2 hours.  If you don't, you should call the office or go to [facility].     Diabetes mellitus tipo1 o tipo2 durante el embarazo, diagnstico Type 1 or Type 2 Diabetes Mellitus During Pregnancy, Diagnosis La diabetes tipo1 (diabetes mellitus tipo1) y la diabetes tipo2 (diabetes mellitus tipo2) son enfermedades a Air cabin crewlargo plazo (crnicas). En la diabetes tipo1, el pncreas no produce la cantidad suficiente de una hormona llamada insulina. En la diabetes tipo 2, puede presentarse uno de los [address]iguientes problemas, o ambos:  El pncreas no produce la cantidad suficiente de insulina.  Las clulas del cuerpo no responden de [place]era adecuada a la insulina que el organismo produce (resistencia a la insulina). Normalmente, la insulina permite que ciertos azcares (glucosa) ingresen a las clulas del cuerpo. Las clulas usan la glucosa para Psychiatristobtener energa. La resistencia a la insulina o la falta de insulina hacen que el exceso de glucosa se acumule en la sangre, en lugar de ir a las clulas. Como resultado, aumenta la glucemia (hiperglucemia). Si la diabetes se trata, hay pocas probabilidades de que ocasione problemas. Si no se la controla con tratamiento, puede  causar problemas durante el [place] de parto y Unityel parto, y algunos de esos problemas pueden ser dainos para el beb en gestacin (feto) y Chief Strategy Officerla madre. Qu incrementa el riesgo? Puede ser ms propensa a desarrollar diabetes si tiene antecedentes familiares de esta enfermedad. Hay otros factores que pueden hacerla ms propensa a tener diabetes tipo1, por ejemplo:  Ser portador de un gen de la diabetes tipo1 que se transmite de padres a hijos (hereditario).  Vivir en una zona donde el clima es fro.  Exposicin a determinados virus.  Determinadas afecciones en las que el sistema del cuerpo encargado de combatir las enfermedades se ataca a s mismo (trastornos autoinmunitarios). Hay otros factores que pueden hacerla ms propensa a tener diabetes tipo2, por ejemplo:  Tener exceso de peso u obesidad.  Tener un estilo de vida inactivo (sedentario).  Tener antecedentes de lo siguiente: ? Prediabetes. ? Diabetes gestacional. ? Sndrome del ovario poliqustico (SOP).  Ser descendiente de indgenas norteamericanos, afroamericanos, hispanos o latinos, o asiticos o isleos del Pacfico. Cules son los signos o los sntomas? Los sntomas de la diabetes tipo1 y tipo2 pueden incluir los siguientes:  Aumento de la sed (polidipsia).  Aumento de la miccin (poliuria).  Aumento del apetito (polifagia).  Aumento de la miccin durante la noche (nocturia).  Cambios en el peso repentinos o sin motivo aparente.  Infecciones frecuentes que se repiten (recurrentes).  Fatiga.  Debilidad.  Cambios en la visin, como visin borrosa.  Aliento con Charles Schwabolor a fruta.  Cortes  o moretones que tardan en sanar.  Hormigueo o adormecimiento de [employer].  Manchas oscuras en la piel (acantosis nigricans). Cmo se diagnostica? La diabetes se diagnostica en funcin de los sntomas, los antecedentes mdicos, un examen fsico y el nivel de glucemia. Si tiene factores de riesgo de diabetes,  pueden hacerle pruebas de deteccin de la enfermedad en la primera visita de atencin mdica durante el embarazo (visita prenatal). La glucemia puede comprobarse con uno o ms de los siguientes anlisis de sangre:  Prueba de la glucemia en ayunas. No se le permitir comer (tendr que [employer]) por al menos 8horas antes de que se le tome una [place] de [place].  Prueba al azar de la glucemia. Esta prueba mide su nivel de glucemia en cualquier momento del da, sin importar cundo comi.  Una prueba de sangre de A1c (hemoglobina A1c). Esta prueba proporciona informacin sobre el control de la glucemia durante los ltimos 2o24meses. Pueden diagnosticarle diabetes en los siguientes casos:  El resultado de la prueba de la glucemia en ayunas es de 126mg /dl (4,7WGNF/A) o ms.  El resultado de la prueba al azar de la glucemia es de 200mg /dl (21,3YQMV/H) o ms.  El resultado dela prueba de sangre de A1c es de 6,5% o ms. Estas pruebas de sangre se pueden repetir para Optometrist. Cmo se trata?     El tratamiento puede estar a cargo de Psychiatrist. Para tratar la diabetes, el mdico puede indicarle lo siguiente:  Aplicarse insulina o tomar otros medicamentos diariamente. Esto ayuda a Photographer glucemia en un rango saludable. ? Si [place] insulina, tal vez deba ajustar las dosis en funcin de la cantidad de actividad fsica que realiza y de los alimentos que consume. El mdico le indicar cmo hacerlo.  Controlar su nivel de glucemia con regularidad.  Hacer cambios en la dieta y el estilo de vida. Estos pueden incluir: ? Seguir una dieta personalizada elaborada por un especialista en alimentacin y nutricin (nutricionista certificado). ? Hacer actividad fsica con regularidad. ? Buscar maneras de Charity fundraiser.  Tomar medicamentos para evitar las complicaciones de la diabetes, por ejemplo: ? Aspirina. ? Medicamentos para Financial risk analyst. ? Medicamentos para Scientist, physiological presin arterial. El mdico fijar objetivos de tratamiento personales para usted. Generalmente, el objetivo del tratamiento es [employer] siguientes niveles de glucemia durante el embarazo:  Antes de las comidas (preprandial): valor de 95mg /dl (8,4ONGE/X) o inferior.  Despus de las comidas (posprandial): ? Una hora despus de una comida: igual o menor que 140mg /dl (5,2WUXL/K). ? Dos horas despus de una comida: igual o menor que 120mg /dl (4,4WNUU/V).  Nivel de A1c: del 6% al 6,5%. Siga estas indicaciones en su casa: Preguntas para hacerle al mdico  Considere la posibilidad de hacer las siguientes preguntas: ? Es necesario que consulte a IT trainer en el cuidado de la diabetes? ? Dnde puedo encontrar un grupo de apoyo para las personas con diabetes? ? Qu equipos necesitar para controlar la diabetes en casa? ? Qu medicamentos para la diabetes necesito y cundo debo tomarlos? ? Con qu frecuencia debo controlarme la glucemia? ? A qu nmero puedo llamar si tengo preguntas? ? Cundo es mi prxima cita? Instrucciones generales  [employer] de venta libre y los recetados solamente como se lo haya indicado el mdico.  Controle el aumento de peso durante el [place]. La cantidad de [employer] se espera que aumente depende de su IMC (ndice de masa corporal)  antes del embarazo.  Concurra a todas las visitas de [address] se lo haya indicado el mdico. Esto es importante.  Para obtener ms informacin sobre la diabetes, visite los siguientes sitios web: ? Landscape architect de la Diabetes (American Diabetes Association, ADA): www.diabetes.org ? Asociacin Estadounidense de Instructores para el Cuidado de la Diabetes (American Association of Diabetes Educators, AADE): www.diabeteseducator.org Comunquese con un mdico si:  Su nivel de glucemia es igual o mayor que 240mg /dl (92.1JHER/D).  Su nivel  de glucemia es igual o mayor que 200mg /dl (40,8XKGY/J), y tiene cetonas en la orina.  Ha estado enferma o ha tenido fiebre durante ms de [address] y no [place].  Tiene alguno de los siguientes problemas durante ms de 6horas: ? No puede comer ni beber. ? Tiene nuseas y vmitos. ? Tiene diarrea. Solicite ayuda de inmediato si:  Su nivel de glucemia es igual o menor que 54mg /dl (38mmol/l).  Se siente confundida o tiene dificultad para pensar con claridad.  Tiene dificultad para respirar.  Tiene un nivel moderado o alto de cetonas en la [place].  El beb se mueve menos de lo habitual.  Tiene secreciones inusuales o sangrado de la vagina.  Comienza a tener contracciones antes de tiempo (prematuramente). Las contracciones se pueden sentir como un endurecimiento de la parte inferior del abdomen. Resumen  La diabetes tipo1 (diabetes mellitus tipo1) y la diabetes tipo2 (diabetes mellitus tipo2) son enfermedades a Air cabin crew (crnicas).  Esta afeccin puede tratarse mediante la administracin de [place] y otros medicamentos que ayudan a Radio producer las complicaciones. Cambios en la dieta y en el estilo de vida tambin forman parte del [place].  Si la diabetes se trata, hay pocas probabilidades de que ocasione problemas. Si esta afeccin no se controla con tratamiento, puede ocasionar problemas durante el [place] de parto y [place]. Algunos de esos problemas pueden daar al beb en gestacin (feto) y a la [place].  El mdico fijar objetivos de tratamiento personales para usted. Esta informacin no tiene Theme park manager el consejo del mdico. Asegrese de hacerle al mdico cualquier pregunta que tenga. Document Released: 10/15/2011 Document Revised: 11/21/2016 Document Reviewed: 02/23/2015 Elsevier Interactive Patient Education  2019 [employer].

## 2018-02-08 NOTE — Progress Notes (Signed)
[place] 33 wks,cephalic,anterior placenta gr 2,normal ovaries bilat,afi 14 cm,fhr 146 bpm,EFW 2601 g 93%,AC 99%,limited head measurement because of fetal position

## 2018-02-08 NOTE — Progress Notes (Signed)
HIGH-RISK PREGNANCY VISIT Patient name: Jessica Carey MRN [id number]  Date of birth: [birth year] Chief Complaint:   High Risk Gestation (u/s today; states outside vaginal area is hurting)  History of Present Illness:   Jessica Carey is a 33 y.o. G1P0000 female at [redacted]w[redacted]d with an Estimated Date of Delivery: 03/29/18 being seen today for ongoing management of a high-risk pregnancy complicated by T2DM on metformin 1,000mg  BID, Lantus 20u qhs, Glyburide 10mg  qhs; short cervix 0.7cm w/ funneling- on prometrium; suspected LGA 94% today.  Today she reports fbs 86-89, 2hr pp 122-140 (all >120). Vulva feels sore, rubs when walking. Denies abnormal discharge, itching/odor/irritation.   Contractions: Not present. Vag. Bleeding: None.  Movement: Present. denies leaking of fluid.  Review of Systems:   Pertinent items are noted in HPI Denies abnormal vaginal discharge w/ itching/odor/irritation, headaches, visual changes, shortness of breath, chest pain, abdominal pain, severe nausea/vomiting, or problems with urination or bowel movements unless otherwise stated above. Pertinent History Reviewed:  Reviewed past medical,surgical, social, obstetrical and family history.  Reviewed problem list, medications and allergies. Physical Assessment:   Vitals:   02/08/18 1218  BP: 117/65  Pulse: 92  Weight: 198 lb (89.8 kg)  Body mass index is 33.99 kg/m.           Physical Examination:   General appearance: alert, well appearing, and in no distress  Mental status: alert, oriented to person, place, and time  Skin: warm & dry   Extremities: Edema: Trace    Cardiovascular: normal heart rate noted  Respiratory: normal respiratory effort, no distress  Abdomen: gravid, soft, non-tender  Pelvic: normal vulva, probably some pelvic congestion         Fetal Status: Fetal Heart Rate (bpm): 146 u/s   Movement: Present    Fetal Surveillance Testing today: [place] 33 wks,cephalic,anterior placenta gr 2,normal ovaries bilat,afi  14 cm,fhr 146 bpm,EFW 2601 g 94%,AC 99%,limited head measurement because of fetal position,BPP 8/8  Results for orders placed or performed in visit on 02/08/18 (from the past 24 hour(s))  POC Urinalysis Dipstick OB   Collection Time: 02/08/18 12:07 PM  Result Value Ref Range   Color, UA     Clarity, UA     Glucose, UA Negative Negative   Bilirubin, UA     Ketones, UA neg    Spec Grav, UA     Blood, UA neg    pH, UA     POC,PROTEIN,UA Negative Negative, Trace, Small (1+), Moderate (2+), Large (3+), 4+   Urobilinogen, UA     Nitrite, UA neg    Leukocytes, UA Negative Negative   Appearance     Odor      Assessment & Plan:  1) High-risk pregnancy G1P0000 at [redacted]w[redacted]d with an Estimated Date of Delivery: 03/29/18   2) T2DM, unstable, discussed w/ JVF who recommends increasing Lantus to 26 units qhs, continue metformin 1,000mg  BID and glyburide 10mg  qhs   3) Short cervix, stable, continue prometrium q hs  4) Suspected LGA> 94% w/ AC 99% today  Meds: No orders of the defined types were placed in this encounter.  Labs/procedures today: efw/bpp u/s today  Treatment Plan:  Growth u/s @ 36wks    2x/wk testing or weekly BPP     Deliver 39wks or as clinically indicated  Reviewed: Preterm labor symptoms and general obstetric precautions including but not limited to vaginal bleeding, contractions, leaking of fluid and fetal movement were reviewed in detail with the patient.  All questions were answered.  Follow-up: Return for Thurs HROB and NST.  Orders Placed This Encounter  Procedures  . POC Urinalysis Dipstick OB   Cheral Marker CNM, [employer] 02/08/2018 1:23 PM

## 2018-02-11 ENCOUNTER — Other Ambulatory Visit: Payer: Self-pay

## 2018-02-11 ENCOUNTER — Encounter: Payer: Self-pay | Admitting: Obstetrics & Gynecology

## 2018-02-11 ENCOUNTER — Ambulatory Visit (INDEPENDENT_AMBULATORY_CARE_PROVIDER_SITE_OTHER): Payer: BLUE CROSS/BLUE SHIELD | Admitting: Obstetrics & Gynecology

## 2018-02-11 VITALS — BP 95/56 | HR 45 | Wt 200.0 lb

## 2018-02-11 DIAGNOSIS — Z1389 Encounter for screening for other disorder: Secondary | ICD-10-CM

## 2018-02-11 DIAGNOSIS — Z3A33 33 weeks gestation of pregnancy: Secondary | ICD-10-CM

## 2018-02-11 DIAGNOSIS — O0993 Supervision of high risk pregnancy, unspecified, third trimester: Secondary | ICD-10-CM

## 2018-02-11 DIAGNOSIS — O26873 Cervical shortening, third trimester: Secondary | ICD-10-CM

## 2018-02-11 DIAGNOSIS — O26879 Cervical shortening, unspecified trimester: Secondary | ICD-10-CM

## 2018-02-11 DIAGNOSIS — Z331 Pregnant state, incidental: Secondary | ICD-10-CM

## 2018-02-11 DIAGNOSIS — O24313 Unspecified pre-existing diabetes mellitus in pregnancy, third trimester: Secondary | ICD-10-CM

## 2018-02-11 DIAGNOSIS — O24319 Unspecified pre-existing diabetes mellitus in pregnancy, unspecified trimester: Secondary | ICD-10-CM

## 2018-02-11 LAB — POCT URINALYSIS DIPSTICK OB
Blood, UA: NEGATIVE
Glucose, UA: NEGATIVE
Ketones, UA: NEGATIVE
Leukocytes, UA: NEGATIVE
Nitrite, UA: NEGATIVE
POC,PROTEIN,UA: NEGATIVE

## 2018-02-11 NOTE — Progress Notes (Signed)
   HIGH-RISK PREGNANCY VISIT Patient name: Jessica Carey MRN [id number]  Date of birth: July 06, 1985 Chief Complaint:   High Risk Gestation (NST)  History of Present Illness:   Jessica Carey is a 33 y.o. G1P0000 female at [redacted]w[redacted]d with an Estimated Date of Delivery: 03/29/18 being seen today for ongoing management of a high-risk pregnancy complicated by class  B DM. + short cervix Today she reports no complaints. Contractions: Irritability. Vag. Bleeding: None.  Movement: Present. denies leaking of fluid.  Review of Systems:   Pertinent items are noted in HPI Denies abnormal vaginal discharge w/ itching/odor/irritation, headaches, visual changes, shortness of breath, chest pain, abdominal pain, severe nausea/vomiting, or problems with urination or bowel movements unless otherwise stated above. Pertinent History Reviewed:  Reviewed past medical,surgical, social, obstetrical and family history.  Reviewed problem list, medications and allergies. Physical Assessment:   Vitals:   02/11/18 1022  BP: (!) 95/56  Pulse: (!) 45  Weight: 200 lb (90.7 kg)  Body mass index is 34.33 kg/m.           Physical Examination:   General appearance: alert, well appearing, and in no distress  Mental status: alert, oriented to person, place, and time  Skin: warm & dry   Extremities: Edema: Trace    Cardiovascular: normal heart rate noted  Respiratory: normal respiratory effort, no distress  Abdomen: gravid, soft, non-tender  Pelvic: Cervical exam deferred         Fetal Status:     Movement: Present    Fetal Surveillance Testing today: reactive NST   Results for orders placed or performed in visit on 02/11/18 (from the past 24 hour(s))  POC Urinalysis Dipstick OB   Collection Time: 02/11/18 10:22 AM  Result Value Ref Range   Color, UA     Clarity, UA     Glucose, UA Negative Negative   Bilirubin, UA     Ketones, UA neg    Spec Grav, UA     Blood, UA neg    pH, UA     POC,PROTEIN,UA Negative  Negative, Trace, Small (1+), Moderate (2+), Large (3+), 4+   Urobilinogen, UA     Nitrite, UA neg    Leukocytes, UA Negative Negative   Appearance     Odor      Assessment & Plan:  1) High-risk pregnancy G1P0000 at [redacted]w[redacted]d with an Estimated Date of Delivery: 03/29/18   2) Class B DM with EFW >90% stable, Lantus 26 units at bedtime, metformin 1000 mg BID, glyburide 10 mg at bedtime  3) Short cervix, stable, on prometrium s/p BMZ x 2  Meds: No orders of the defined types were placed in this encounter.   Labs/procedures today: reactive NST  Treatment Plan:  Twice weekly surveillance EFW 36-37 weeks  Reviewed: Term labor symptoms and general obstetric precautions including but not limited to vaginal bleeding, contractions, leaking of fluid and fetal movement were reviewed in detail with the patient.  All questions were answered.  Follow-up: No follow-ups on file.  Orders Placed This Encounter  Procedures  . POC Urinalysis Dipstick OB   Lazaro Arms  02/11/2018 10:54 AM

## 2018-02-15 ENCOUNTER — Ambulatory Visit (INDEPENDENT_AMBULATORY_CARE_PROVIDER_SITE_OTHER): Payer: BLUE CROSS/BLUE SHIELD | Admitting: Family Medicine

## 2018-02-15 ENCOUNTER — Ambulatory Visit (INDEPENDENT_AMBULATORY_CARE_PROVIDER_SITE_OTHER): Payer: BLUE CROSS/BLUE SHIELD

## 2018-02-15 VITALS — BP 98/65 | HR 90 | Wt 197.0 lb

## 2018-02-15 DIAGNOSIS — O24113 Pre-existing diabetes mellitus, type 2, in pregnancy, third trimester: Secondary | ICD-10-CM

## 2018-02-15 DIAGNOSIS — O26879 Cervical shortening, unspecified trimester: Secondary | ICD-10-CM

## 2018-02-15 DIAGNOSIS — O0993 Supervision of high risk pregnancy, unspecified, third trimester: Secondary | ICD-10-CM | POA: Diagnosis not present

## 2018-02-15 DIAGNOSIS — O26872 Cervical shortening, second trimester: Secondary | ICD-10-CM | POA: Diagnosis not present

## 2018-02-15 DIAGNOSIS — I4891 Unspecified atrial fibrillation: Secondary | ICD-10-CM

## 2018-02-15 DIAGNOSIS — Z331 Pregnant state, incidental: Secondary | ICD-10-CM

## 2018-02-15 DIAGNOSIS — Z3A34 34 weeks gestation of pregnancy: Secondary | ICD-10-CM

## 2018-02-15 DIAGNOSIS — O0990 Supervision of high risk pregnancy, unspecified, unspecified trimester: Secondary | ICD-10-CM

## 2018-02-15 DIAGNOSIS — O26873 Cervical shortening, third trimester: Secondary | ICD-10-CM

## 2018-02-15 DIAGNOSIS — Z1389 Encounter for screening for other disorder: Secondary | ICD-10-CM

## 2018-02-15 LAB — POCT URINALYSIS DIPSTICK OB
Blood, UA: NEGATIVE
Glucose, UA: NEGATIVE
Ketones, UA: NEGATIVE
Leukocytes, UA: NEGATIVE
Nitrite, UA: NEGATIVE
POC,PROTEIN,UA: NEGATIVE

## 2018-02-15 NOTE — Patient Instructions (Signed)

## 2018-02-15 NOTE — Progress Notes (Signed)
HIGH-RISK PREGNANCY VISIT Patient name: Jessica Carey MRN [id number]  Date of birth: Nov 03, 1985 Chief Complaint:   High Risk Gestation (u/s today)  History of Present Illness:   Jessica Carey is a 33 y.o. G1P0000 female at [redacted]w[redacted]d with an Estimated Date of Delivery: 03/29/18 being seen today for ongoing management of a high-risk pregnancy complicated by class  B DM. + short cervix  Diabetes:  Fasting<90 PP occasional 140-150 One episode of symptomatic hypoglycemia in the 50s. After increasing her lantus to 26u nightly   Today she reports backache and vaginal pressure increasing, "feels like a bowling ball". Contractions: Not present. Vag. Bleeding: None.  Movement: Present. denies leaking of fluid.  Review of Systems:   Pertinent items are noted in HPI Denies abnormal vaginal discharge w/ itching/odor/irritation, headaches, visual changes, shortness of breath, chest pain, abdominal pain, severe nausea/vomiting, or problems with urination or bowel movements unless otherwise stated above. Pertinent History Reviewed:  Reviewed past medical,surgical, social, obstetrical and family history.  Reviewed problem list, medications and allergies. Physical Assessment:   Vitals:   02/15/18 1150  BP: 98/65  Pulse: 90  Weight: 197 lb (89.4 kg)  Body mass index is 33.81 kg/m.           Physical Examination:   General appearance: alert, well appearing, and in no distress  Mental status: alert, oriented to person, place, and time  Skin: warm & dry   Extremities: Edema: Trace    Cardiovascular: normal heart rate noted  Respiratory: normal respiratory effort, no distress  Abdomen: gravid, soft, non-tender  Pelvic: Cervical exam deferred         Fetal Status:     Movement: Present    Fetal Surveillance Testing today: BPP8/8  Results for orders placed or performed in visit on 02/15/18 (from the past 24 hour(s))  POC Urinalysis Dipstick OB   Collection Time: 02/15/18 11:49 AM  Result Value Ref  Range   Color, UA     Clarity, UA     Glucose, UA Negative Negative   Bilirubin, UA     Ketones, UA neg    Spec Grav, UA     Blood, UA neg    pH, UA     POC,PROTEIN,UA Negative Negative, Trace, Small (1+), Moderate (2+), Large (3+), 4+   Urobilinogen, UA     Nitrite, UA neg    Leukocytes, UA Negative Negative   Appearance     Odor      Assessment & Plan:  1) High-risk pregnancy G1P0000 at [redacted]w[redacted]d  with an Estimated Date of Delivery: 03/29/18   2) Class B DM with EFW >90% at last check, planning at 36-37wks,.  Continue Lantus 26 units at bedtime, metformin 1000 mg BID, glyburide 10 mg at bedtime -Counseled to assure evening meal is large enough to prevent BS decrease. Recommended having snack close by if symptomatic and call office if more frequent.  -Plan for IOL at 39wks  3) Short cervix, stable, on prometrium s/p BMZ x 2  Meds: No orders of the defined types were placed in this encounter.   Labs/procedures today: BPP 8/8 today  Treatment Plan:  Weekly BPP, EFW scheduled at 36wks  Reviewed: Term labor symptoms and general obstetric precautions including but not limited to vaginal bleeding, contractions, leaking of fluid and fetal movement were reviewed in detail with the patient.  All questions were answered.  Follow-up: Return in about 1 week (around 02/22/2018) for Scheduled prenatal/BPP.  Future Appointments  Date Time Provider Department  Center  02/22/2018 11:15 AM [facility] [place] [facility] None  02/22/2018 12:00 PM Cheral Marker, CNM [facility]  03/01/2018 11:30 AM [facility] [place] [facility] None  03/01/2018 12:15 PM Eure, Amaryllis Dyke, MD [facility]     Orders Placed This Encounter  Procedures  . POC Urinalysis Dipstick OB   Isa Rankin [facility]  02/15/2018 12:35 PM

## 2018-02-15 NOTE — Progress Notes (Signed)
[place] 34 wks,cephalic,BPP 8/8,normal ovaries bilat,fhr 137 bpm,anterior placenta gr 2,afi 13 cm

## 2018-02-20 ENCOUNTER — Observation Stay (HOSPITAL_COMMUNITY)
Admission: AD | Admit: 2018-02-20 | Discharge: 2018-02-21 | DRG: 832 | Disposition: A | Discharge status: Home / Self Care | Payer: BLUE CROSS/BLUE SHIELD | Attending: Obstetrics and Gynecology | Admitting: Obstetrics and Gynecology

## 2018-02-20 ENCOUNTER — Other Ambulatory Visit: Payer: Self-pay

## 2018-02-20 ENCOUNTER — Encounter (HOSPITAL_COMMUNITY): Payer: Self-pay

## 2018-02-20 DIAGNOSIS — O24113 Pre-existing diabetes mellitus, type 2, in pregnancy, third trimester: Secondary | ICD-10-CM | POA: Diagnosis present

## 2018-02-20 DIAGNOSIS — O26873 Cervical shortening, third trimester: Secondary | ICD-10-CM | POA: Diagnosis not present

## 2018-02-20 DIAGNOSIS — O4703 False labor before 37 completed weeks of gestation, third trimester: Principal | ICD-10-CM | POA: Diagnosis present

## 2018-02-20 DIAGNOSIS — O3663X0 Maternal care for excessive fetal growth, third trimester, not applicable or unspecified: Secondary | ICD-10-CM | POA: Diagnosis present

## 2018-02-20 DIAGNOSIS — Z794 Long term (current) use of insulin: Secondary | ICD-10-CM

## 2018-02-20 DIAGNOSIS — Z3A34 34 weeks gestation of pregnancy: Secondary | ICD-10-CM

## 2018-02-20 DIAGNOSIS — E119 Type 2 diabetes mellitus without complications: Secondary | ICD-10-CM | POA: Diagnosis present

## 2018-02-20 DIAGNOSIS — O4700 False labor before 37 completed weeks of gestation, unspecified trimester: Secondary | ICD-10-CM | POA: Diagnosis present

## 2018-02-20 LAB — URINALYSIS, ROUTINE W REFLEX MICROSCOPIC
Bilirubin Urine: NEGATIVE
Glucose, UA: NEGATIVE mg/dL
Hgb urine dipstick: NEGATIVE
Ketones, ur: 80 mg/dL — AB
Leukocytes, UA: NEGATIVE
Nitrite: NEGATIVE
Protein, ur: NEGATIVE mg/dL
Specific Gravity, Urine: 1.018 (ref 1.005–1.030)
pH: 7 (ref 5.0–8.0)

## 2018-02-20 LAB — COMPREHENSIVE METABOLIC PANEL
ALT: 16 U/L (ref 0–44)
AST: 20 U/L (ref 15–41)
Albumin: 3.1 g/dL — ABNORMAL LOW (ref 3.5–5.0)
Alkaline Phosphatase: 116 U/L (ref 38–126)
Anion gap: 10 (ref 5–15)
BUN: 11 mg/dL (ref 6–20)
CO2: 20 mmol/L — ABNORMAL LOW (ref 22–32)
Calcium: 9 mg/dL (ref 8.9–10.3)
Chloride: 103 mmol/L (ref 98–111)
Creatinine, Ser: 0.57 mg/dL (ref 0.44–1.00)
GFR calc Af Amer: >60 mL/min (ref >60)
GFR calc non Af Amer: >60 mL/min (ref >60)
Glucose, Bld: 103 mg/dL — ABNORMAL HIGH (ref 70–99)
Potassium: 3.9 mmol/L (ref 3.5–5.1)
Sodium: 133 mmol/L — ABNORMAL LOW (ref 135–145)
Total Bilirubin: 0.8 mg/dL (ref 0.3–1.2)
Total Protein: 6.5 g/dL (ref 6.5–8.1)

## 2018-02-20 LAB — OB RESULTS CONSOLE GBS: GBS: NEGATIVE

## 2018-02-20 LAB — TYPE AND SCREEN
ABO/RH(D): O POS
Antibody Screen: NEGATIVE

## 2018-02-20 LAB — GLUCOSE, CAPILLARY
Glucose-Capillary: 88 mg/dL (ref 70–99)
Glucose-Capillary: 123 mg/dL — ABNORMAL HIGH (ref 70–99)

## 2018-02-20 LAB — ABO/RH: ABO/RH(D): O POS

## 2018-02-20 MED ORDER — LACTATED RINGERS IV BOLUS
1000.0000 mL | Freq: Once | INTRAVENOUS | Status: AC
  Administered 2018-02-20: 1000 mL via INTRAVENOUS

## 2018-02-20 MED ORDER — NIFEDIPINE 10 MG PO CAPS
10.0000 mg | ORAL_CAPSULE | Freq: Four times a day (QID) | ORAL | Status: DC
  Administered 2018-02-20 – 2018-02-21 (×3): 10 mg via ORAL
  Filled 2018-02-20 (×4): qty 1

## 2018-02-20 MED ORDER — METFORMIN HCL 500 MG PO TABS
1000.0000 mg | ORAL_TABLET | Freq: Two times a day (BID) | ORAL | Status: DC
  Administered 2018-02-21: 1000 mg via ORAL
  Filled 2018-02-20 (×2): qty 2

## 2018-02-20 MED ORDER — ACETAMINOPHEN 325 MG PO TABS: 650.0000 mg | ORAL_TABLET | ORAL | Status: DC | PRN

## 2018-02-20 MED ORDER — CALCIUM CARBONATE ANTACID 500 MG PO CHEW: 2.0000 | CHEWABLE_TABLET | ORAL | Status: DC | PRN

## 2018-02-20 MED ORDER — DOCUSATE SODIUM 100 MG PO CAPS
100.0000 mg | ORAL_CAPSULE | Freq: Every day | ORAL | Status: DC
  Administered 2018-02-21: 100 mg via ORAL
  Filled 2018-02-20: qty 1

## 2018-02-20 MED ORDER — LACTATED RINGERS IV SOLN
INTRAVENOUS | Status: DC
  Administered 2018-02-20: 14:00:00 via INTRAVENOUS

## 2018-02-20 MED ORDER — NIFEDIPINE 10 MG PO CAPS
10.0000 mg | ORAL_CAPSULE | ORAL | Status: AC
  Administered 2018-02-20 (×3): 10 mg via ORAL
  Filled 2018-02-20 (×3): qty 1

## 2018-02-20 MED ORDER — GLYBURIDE 5 MG PO TABS
10.0000 mg | ORAL_TABLET | Freq: Every day | ORAL | Status: DC
  Administered 2018-02-20: 10 mg via ORAL
  Filled 2018-02-20: qty 2

## 2018-02-20 MED ORDER — PROGESTERONE MICRONIZED 200 MG PO CAPS
200.0000 mg | ORAL_CAPSULE | Freq: Every day | ORAL | Status: DC
  Administered 2018-02-20: 200 mg via VAGINAL
  Filled 2018-02-20: qty 1

## 2018-02-20 MED ORDER — PRENATAL MULTIVITAMIN CH
1.0000 | ORAL_TABLET | Freq: Every day | ORAL | Status: DC
  Administered 2018-02-21: 1 via ORAL
  Filled 2018-02-20: qty 1

## 2018-02-20 MED ORDER — LACTATED RINGERS IV SOLN
INTRAVENOUS | Status: DC
  Administered 2018-02-20 – 2018-02-21 (×2): via INTRAVENOUS

## 2018-02-20 MED ORDER — NIFEDIPINE 10 MG PO CAPS
20.0000 mg | ORAL_CAPSULE | Freq: Once | ORAL | Status: AC
  Administered 2018-02-20: 20 mg via ORAL
  Filled 2018-02-20: qty 2

## 2018-02-20 MED ORDER — INSULIN GLARGINE 100 UNIT/ML ~~LOC~~ SOLN
26.0000 [IU] | Freq: Every day | SUBCUTANEOUS | Status: DC
  Administered 2018-02-21: 26 [IU] via SUBCUTANEOUS
  Filled 2018-02-20 (×2): qty 0.26

## 2018-02-20 NOTE — H&P (Signed)
History     CSN: [id number]  Arrival date and time: 02/20/18 1050   First Provider Initiated Contact with Patient 02/20/18 1128      Chief Complaint  Patient presents with  . Contractions   HPI   [name] is a 33 y.o. female G1P0000 @[redacted]w[redacted]d high risk pregnancy,  with a history of DM2 on insulin, short cervix and large for gestational age fetus here with contractions. The contractions started yesterday and have not stopped. She describes the contraction pain like a menstrual cramp that starts in her lower abdomen and radiates around to her lower back. The pain comes and goes. She rates the pain 1-2. + fetal movement.   She received betamethasone X 2 on 12/5 & 12/6.   OB History    Gravida  1   Para  0   Term  0   Preterm  0   AB  0   Living  0     SAB  0   TAB  0   Ectopic  0   Multiple  0   Live Births  0           Past Medical History:  Diagnosis Date  . Boil, breast 07/04/2013  . Diabetes (HCC) 08/04/2013  . Diabetes mellitus without complication (HCC)   . Irregular periods     Past Surgical History:  Procedure Laterality Date  . BREAST SURGERY Right    breast abcess  . SURGERY OF LIP      Family History  Problem Relation Age of Onset  . Diabetes Mother   . Diabetes Father   . Hyperlipidemia Father   . Diabetes Sister   . Cancer Maternal Aunt        liver  . Diabetes Paternal Grandmother     Social History   Tobacco Use  . Smoking status: Never Smoker  . Smokeless tobacco: Never Used  Substance Use Topics  . Alcohol use: Not Currently    Comment: occ.  . Drug use: No    Allergies: No Known Allergies  Medications Prior to Admission  Medication Sig Dispense Refill Last Dose  . glyBURIDE (DIABETA) 5 MG tablet Take 2 tablets (10 mg total) by mouth at bedtime. 60 tablet 3 Taking  . insulin glargine (LANTUS) 100 UNIT/ML injection Inject 0.2 mLs (20 Units total) into the skin daily. (Patient taking differently: Inject 26 Units  into the skin daily. ) 10 mL 11 Taking  . metFORMIN (GLUCOPHAGE) 1000 MG tablet Take 1 tablet (1,000 mg total) by mouth 2 (two) times daily with a meal. 60 tablet 6 Taking  . Prenatal Vit-Fe Fumarate-FA (PNV PRENATAL PLUS MULTIVITAMIN) 27-1 MG TABS Take 1 tablet by mouth daily. 30 tablet 11 Taking  . progesterone (PROMETRIUM) 200 MG capsule Place 1 tablet in vagina every night 30 capsule 5 Taking     Review of Systems  Gastrointestinal: Positive for abdominal pain.  Musculoskeletal: Positive for back pain.   Physical Exam   Blood pressure 115/73, pulse 86, temperature 97.9 F (36.6 C), temperature source Oral, resp. rate 18, weight 88.5 kg, last menstrual period 06/10/2017, SpO2 100 %.  Physical Exam  GI: Soft.  Genitourinary:    Genitourinary Comments: Dilation: 2.5 Effacement (%): 60 Cervical Position: Anterior Station: -2 Presentation: Vertex + bloody show Exam by:: j Jessica Vicknair np   Skin: Skin is warm.  Psychiatric: Her behavior is normal.    MAU Course  Procedures  None  MDM  Ketones >80 LR bolus   x2 Procardia x 3 doses Patient continues to rate her pain 2-3; she says that the pain is worse than when she arrived.  GBS collected and pending  Cervix changed from 2.5-3 cm, now 80% with bloody show.  Discussed Observation vs. DC with Dr. Constant. Will observe on 3rd floor.   Assessment and Plan   A:  1. Threatened premature labor in third trimester     P:  Admit to 3rd floor Betamethasone complete 12/5 & 12/6 GBS pending Discussed admission with Nicu; they Ok'd patient to stay  [name] I, NP 02/20/2018 4:01 PM  

## 2018-02-20 NOTE — MAU Provider Note (Signed)
History     CSN: [id number]  Arrival date [id number] time: 02/20/18 1050   First Provider Initiated Contact with Patient 02/20/18 1128      Chief Complaint  Patient presents with  . Contractions   HPI   Jessica Carey is a 33 y.o. female G1P0000 @[redacted]w[redacted]d  high risk pregnancy,  with a history of DM2 on insulin, short cervix and large for gestational age fetus here with contractions. The contractions started yesterday and have not stopped. She describes the contraction pain like a menstrual cramp that starts in her lower abdomen and radiates around to her lower back. The pain comes and goes. She rates the pain 1-2. + fetal movement.   She received betamethasone X 2 on 12/5 & 12/6.   OB History    Gravida  1   Para  0   Term  0   Preterm  0   AB  0   Living  0     SAB  0   TAB  0   Ectopic  0   Multiple  0   Live Births  0           Past Medical History:  Diagnosis Date  . Boil, breast 07/04/2013  . Diabetes (HCC) 08/04/2013  . Diabetes mellitus without complication (HCC)   . Irregular periods     Past Surgical History:  Procedure Laterality Date  . BREAST SURGERY Right    breast abcess  . SURGERY OF LIP      Family History  Problem Relation Age of Onset  . Diabetes Mother   . Diabetes Father   . Hyperlipidemia Father   . Diabetes Sister   . Cancer Maternal Aunt        liver  . Diabetes Paternal Grandmother     Social History   Tobacco Use  . Smoking status: Never Smoker  . Smokeless tobacco: Never Used  Substance Use Topics  . Alcohol use: Not Currently    Comment: occ.  . Drug use: No    Allergies: No Known Allergies  Medications Prior to Admission  Medication Sig Dispense Refill Last Dose  . glyBURIDE (DIABETA) 5 MG tablet Take 2 tablets (10 mg total) by mouth at bedtime. 60 tablet 3 Taking  . insulin glargine (LANTUS) 100 UNIT/ML injection Inject 0.2 mLs (20 Units total) into the skin daily. (Patient taking differently: Inject 26 Units  into the skin daily. ) 10 mL 11 Taking  . metFORMIN (GLUCOPHAGE) 1000 MG tablet Take 1 tablet (1,000 mg total) by mouth 2 (two) times daily with a meal. 60 tablet 6 Taking  . Prenatal Vit-Fe Fumarate-FA (PNV PRENATAL PLUS MULTIVITAMIN) 27-1 MG TABS Take 1 tablet by mouth daily. 30 tablet 11 Taking  . progesterone (PROMETRIUM) 200 MG capsule Place 1 tablet in vagina every night 30 capsule 5 Taking     Review of Systems  Gastrointestinal: Positive for abdominal pain.  Musculoskeletal: Positive for back pain.   Physical Exam   Blood pressure 115/73, pulse 86, temperature 97.9 F (36.6 C), temperature source Oral, resp. rate 18, weight 88.5 kg, last menstrual period 06/10/2017, SpO2 100 %.  Physical Exam  GI: Soft.  Genitourinary:    Genitourinary Comments: Dilation: 2.5 Effacement (%): 60 Cervical Position: Anterior Station: -2 Presentation: Vertex + bloody show Exam by:: j Mahogany Torrance np   Skin: Skin is warm.  Psychiatric: Her behavior is normal.    MAU Course  Procedures  None  MDM  Ketones >80 LR bolus  x2 Procardia x 3 doses Patient continues to rate her pain 2-3; she says that the pain is worse than when she arrived.  GBS collected and pending  Cervix changed from 2.5-3 cm, now 80% with bloody show.  Discussed Observation vs. DC with Dr. Jolayne Panther. Will observe on 3rd floor.   Assessment and Plan   A:  1. Threatened premature labor in third trimester     P:  Admit to 3rd floor Betamethasone complete 12/5 & 12/6 GBS pending Discussed admission with Nicu; they Ok'd patient to stay  Bindi Klomp, Harolyn Rutherford, NP 02/20/2018 4:01 PM

## 2018-02-20 NOTE — MAU Note (Signed)
Jessica Carey is a 33 y.o. at [redacted]w[redacted]d here in MAU reporting: contractions. Started in abdomen last night. Now reports that it is in her lower back and radiates around to abdomen. 5-10 minutes apart. Pain score: denies pain. Reports it is more of a discomfort similar to urge to urniate Vaginal bleeding: denies Vaginal discharge: denies LOF: denies Vitals:   02/20/18 1100  BP: 115/73  Pulse: 86  Resp: 18  Temp: 97.9 F (36.6 C)  SpO2: 100%   FHT:+FM; 151 via doppler Lab orders placed from triage: ua

## 2018-02-21 ENCOUNTER — Other Ambulatory Visit: Payer: Self-pay

## 2018-02-21 DIAGNOSIS — O26873 Cervical shortening, third trimester: Secondary | ICD-10-CM | POA: Diagnosis not present

## 2018-02-21 DIAGNOSIS — O24113 Pre-existing diabetes mellitus, type 2, in pregnancy, third trimester: Secondary | ICD-10-CM | POA: Diagnosis not present

## 2018-02-21 DIAGNOSIS — O3663X0 Maternal care for excessive fetal growth, third trimester, not applicable or unspecified: Secondary | ICD-10-CM | POA: Diagnosis not present

## 2018-02-21 DIAGNOSIS — O4703 False labor before 37 completed weeks of gestation, third trimester: Secondary | ICD-10-CM | POA: Diagnosis not present

## 2018-02-21 DIAGNOSIS — O4700 False labor before 37 completed weeks of gestation, unspecified trimester: Secondary | ICD-10-CM

## 2018-02-21 DIAGNOSIS — Z794 Long term (current) use of insulin: Secondary | ICD-10-CM | POA: Diagnosis not present

## 2018-02-21 DIAGNOSIS — E119 Type 2 diabetes mellitus without complications: Secondary | ICD-10-CM | POA: Diagnosis not present

## 2018-02-21 DIAGNOSIS — Z3A34 34 weeks gestation of pregnancy: Secondary | ICD-10-CM | POA: Diagnosis not present

## 2018-02-21 LAB — RPR: RPR Ser Ql: NONREACTIVE

## 2018-02-21 LAB — GLUCOSE, CAPILLARY: Glucose-Capillary: 81 mg/dL (ref 70–99)

## 2018-02-21 MED ORDER — INSULIN GLARGINE 100 UNIT/ML ~~LOC~~ SOLN: 26.0000 [IU] | Freq: Every day | SUBCUTANEOUS | 11 refills | Status: DC

## 2018-02-21 MED ORDER — NIFEDIPINE ER OSMOTIC RELEASE 30 MG PO TB24: 30.0000 mg | ORAL_TABLET | Freq: Every day | ORAL | 2 refills | Status: DC

## 2018-02-21 NOTE — Discharge Instructions (Signed)
Preterm Labor and Birth Information Pregnancy normally lasts 39-41 weeks. Preterm labor is when labor starts early. It starts before you have been pregnant for 37 whole weeks. What are the risk factors for preterm labor? Preterm labor is more likely to occur in women who:  Have an infection while pregnant.  Have a cervix that is short.  Have gone into preterm labor before.  Have had surgery on their cervix.  Are younger than age 32.  Are older than age 38.  Are African American.  Are pregnant with two or more babies.  Take street drugs while pregnant.  Smoke while pregnant.  Do not gain enough weight while pregnant.  Got pregnant right after another pregnancy. What are the symptoms of preterm labor? Symptoms of preterm labor include:  Cramps. The cramps may feel like the cramps some women get during their period. The cramps may happen with watery poop (diarrhea).  Pain in the belly (abdomen).  Pain in the lower back.  Regular contractions or tightening. It may feel like your belly is getting tighter.  Pressure in the lower belly that seems to get stronger.  More fluid (discharge) leaking from the vagina. The fluid may be watery or bloody.  Water breaking. Why is it important to notice signs of preterm labor? Babies who are born early may not be fully developed. They have a higher chance for:  Long-term heart problems.  Long-term lung problems.  Trouble controlling body systems, like breathing.  Bleeding in the brain.  A condition called cerebral palsy.  Learning difficulties.  Death. These risks are highest for babies who are born before 34 weeks of pregnancy. How is preterm labor treated? Treatment depends on:  How long you were pregnant.  Your condition.  The health of your baby. Treatment may involve:  Having a stitch (suture) placed in your cervix. When you give birth, your cervix opens so the baby can come out. The stitch keeps the cervix  from opening too soon.  Staying at the hospital.  Taking or getting medicines, such as: ? Hormone medicines. ? Medicines to stop contractions. ? Medicines to help the babys lungs develop. ? Medicines to prevent your baby from having cerebral palsy. What should I do if I am in preterm labor? If you think you are going into labor too soon, call your doctor right away. How can I prevent preterm labor?  Do not use any tobacco products. ? Examples of these are cigarettes, chewing tobacco, and e-cigarettes. ? If you need help quitting, ask your doctor.  Do not use street drugs.  Do not use any medicines unless you ask your doctor if they are safe for you.  Talk with your doctor before taking any herbal supplements.  Make sure you gain enough weight.  Watch for infection. If you think you might have an infection, get it checked right away.  If you have gone into preterm labor before, tell your doctor. This information is not intended to replace advice given to you by your health care provider. Make sure you discuss any questions you have with your health care provider. Document Released: 04/18/2008 Document Revised: 07/03/2015 Document Reviewed: 06/13/2015 Elsevier Interactive Patient Education  2019 Elsevier Inc.   [employer] of the uterus can occur throughout pregnancy, but they are not always a sign that you are in labor. You may have practice contractions called Braxton Hicks contractions. These false labor contractions are sometimes confused with true labor. What are Deberah Pelton contractions?  Braxton Hicks contractions are tightening movements that occur in the muscles of the uterus before labor. Unlike true labor contractions, these contractions do not result in opening (dilation) and thinning of the cervix. Toward the end of pregnancy (32-34 weeks), Braxton Hicks contractions can happen more often and may become stronger. These contractions are  sometimes difficult to tell apart from true labor because they can be very uncomfortable. You should not feel embarrassed if you go to the hospital with false labor. Sometimes, the only way to tell if you are in true labor is for your health care provider to look for changes in the cervix. The health care provider will do a physical exam and may monitor your contractions. If you are not in true labor, the exam should show that your cervix is not dilating and your water has not broken. If there are no other health problems associated with your pregnancy, it is completely safe for you to be sent home with false labor. You may continue to have Braxton Hicks contractions until you go into true labor. How to tell the difference between true labor and false labor True labor  Contractions last 30-70 seconds.  Contractions become very regular.  Discomfort is usually felt in the top of the uterus, and it spreads to the lower abdomen and low back.  Contractions do not go away with walking.  Contractions usually become more intense and increase in frequency.  The cervix dilates and gets thinner. False labor  Contractions are usually shorter and not as strong as true labor contractions.  Contractions are usually irregular.  Contractions are often felt in the front of the lower abdomen and in the groin.  Contractions may go away when you walk around or change positions while lying down.  Contractions get weaker and are shorter-lasting as time goes on.  The cervix usually does not dilate or become thin. Follow these instructions at home:   Take over-the-counter and prescription medicines only as told by your health care provider.  Keep up with your usual exercises and follow other instructions from your health care provider.  Eat and drink lightly if you think you are going into labor.  If Braxton Hicks contractions are making you uncomfortable: ? Change your position from lying down or  resting to walking, or change from walking to resting. ? Sit and rest in a tub of warm water. ? Drink enough fluid to keep your urine pale yellow. Dehydration may cause these contractions. ? Do slow and deep breathing several times an hour.  Keep all follow-up prenatal visits as told by your health care provider. This is important. Contact a health care provider if:  You have a fever.  You have continuous pain in your abdomen. Get help right away if:  Your contractions become stronger, more regular, and closer together.  You have fluid leaking or gushing from your vagina.  You pass blood-tinged mucus (bloody show).  You have bleeding from your vagina.  You have low back pain that you never had before.  You feel your babys head pushing down and causing pelvic pressure.  Your baby is not moving inside you as much as it used to. Summary  Contractions that occur before labor are called Braxton Hicks contractions, false labor, or practice contractions.  Braxton Hicks contractions are usually shorter, weaker, farther apart, and less regular than true labor contractions. True labor contractions usually become progressively stronger and regular, and they become more frequent.  Manage discomfort from [facility]  contractions by changing position, resting in a warm bath, drinking plenty of water, or practicing deep breathing. This information is not intended to replace advice given to you by your health care provider. Make sure you discuss any questions you have with your health care provider. Document Released: 06/05/2016 Document Revised: 11/04/2016 Document Reviewed: 06/05/2016 Elsevier Interactive Patient Education  2019 [employer]

## 2018-02-21 NOTE — Discharge Summary (Signed)
OB Discharge Summary     Patient Name: Jessica Carey DOB: 06-15-1985 MRN: [id number]  Date of admission: 02/20/2018 Delivering MD: This patient has no babies on file.  Date of discharge: 02/21/2018  Admitting diagnosis: 34 WKS CTX Intrauterine pregnancy: [redacted]w[redacted]d     Secondary diagnosis:  Active Problems:   Preterm uterine contractions, antepartum  Additional problems: DM2     Discharge diagnosis: preterm contraction                                   Hospital course:  Patient admitted for observation following a change in cervical exam with contraction. Patient was started on procardia for tocolysis. She reports significant improvement in her contractions this morning. She reports good fetal movement. She denies vaginal bleeding or leakage of fluid. Repeat cervical exam 2/50/-3. Patient found stable for discharge with plans to follow up for routine OB tomorrow as scheduled. Discharge instructions were reviewed with the patient  Physical exam  Vitals:   02/20/18 1626 02/20/18 1927 02/20/18 2335 02/21/18 0405  BP: 114/80 108/67 120/73 115/62  Pulse: 91 90 89 87  Resp: 20 17 18 17   Temp: 98.5 F (36.9 C) 98.5 F (36.9 C) 97.8 F (36.6 C) 98.2 F (36.8 C)  TempSrc: Oral Oral Oral Oral  SpO2: 100% 99% 98% 99%  Weight:       GENERAL: Well-developed, well-nourished female in no acute distress.  LUNGS: Clear to auscultation bilaterally.  HEART: Regular rate and rhythm. ABDOMEN: Soft, nontender,gravid CERVIX: 2/50/-3 (unchanged since admission EXTREMITIES: No cyanosis, clubbing, or edema, 2+ distal pulses.  Labs: Lab Results  Component Value Date   WBC 12.7 (H) 01/06/2018   HGB 11.3 01/06/2018   HCT 32.8 (L) 01/06/2018   MCV 86 01/06/2018   PLT 176 01/06/2018   CMP Latest Ref Rng & Units 02/20/2018  Glucose 70 - 99 mg/dL 322(G)  BUN 6 - 20 mg/dL 11  Creatinine 2.54 - 2.70 mg/dL 6.23  Sodium 762 - 831 mmol/L 133(L)  Potassium 3.5 - 5.1 mmol/L 3.9  Chloride 98 - 111  mmol/L 103  CO2 22 - 32 mmol/L 20(L)  Calcium 8.9 - 10.3 mg/dL 9.0  Total Protein 6.5 - 8.1 g/dL 6.5  Total Bilirubin 0.3 - 1.2 mg/dL 0.8  Alkaline Phos 38 - 126 U/L 116  AST 15 - 41 U/L 20  ALT 0 - 44 U/L 16    Discharge instruction: per After Visit Summary and "Baby and Me Booklet".  After visit meds:  Allergies as of 02/21/2018   No Known Allergies     Medication List    TAKE these medications   glyBURIDE 5 MG tablet Commonly known as:  DIABETA Take 2 tablets (10 mg total) by mouth at bedtime.   insulin glargine 100 UNIT/ML injection Commonly known as:  LANTUS Inject 0.26 mLs (26 Units total) into the skin daily.   metFORMIN 1000 MG tablet Commonly known as:  GLUCOPHAGE Take 1 tablet (1,000 mg total) by mouth 2 (two) times daily with a meal.   NIFEdipine 30 MG 24 hr tablet Commonly known as:  PROCARDIA-XL/NIFEDICAL-XL Take 1 tablet (30 mg total) by mouth daily. Can increase to twice a day as needed for symptomatic contractions   PNV PRENATAL PLUS MULTIVITAMIN 27-1 MG Tabs Take 1 tablet by mouth daily.   progesterone 200 MG capsule Commonly known as:  PROMETRIUM Place 1 tablet in vagina every night  Diet: carb modified diet  Activity: Advance as tolerated. Pelvic rest for 6 weeks.   Outpatient follow up: as scheduled 1/20 Follow up Appt: Future Appointments  Date Time Provider Department Center  02/22/2018 11:15 AM [facility] [place] [facility] None  02/22/2018 12:00 PM Cheral Marker, CNM [facility]  03/01/2018 11:30 AM [facility] [place] [facility] None  03/01/2018 12:15 PM Lazaro Arms, MD [facility]     02/21/2018 Catalina Antigua, MD

## 2018-02-22 ENCOUNTER — Encounter: Payer: Self-pay | Admitting: Women's Health

## 2018-02-22 ENCOUNTER — Ambulatory Visit (INDEPENDENT_AMBULATORY_CARE_PROVIDER_SITE_OTHER): Payer: BLUE CROSS/BLUE SHIELD | Admitting: Women's Health

## 2018-02-22 ENCOUNTER — Ambulatory Visit (INDEPENDENT_AMBULATORY_CARE_PROVIDER_SITE_OTHER): Payer: BLUE CROSS/BLUE SHIELD

## 2018-02-22 VITALS — BP 118/66 | HR 62 | Wt 201.5 lb

## 2018-02-22 DIAGNOSIS — O0993 Supervision of high risk pregnancy, unspecified, third trimester: Secondary | ICD-10-CM

## 2018-02-22 DIAGNOSIS — O24113 Pre-existing diabetes mellitus, type 2, in pregnancy, third trimester: Secondary | ICD-10-CM | POA: Diagnosis not present

## 2018-02-22 DIAGNOSIS — O24119 Pre-existing diabetes mellitus, type 2, in pregnancy, unspecified trimester: Secondary | ICD-10-CM

## 2018-02-22 DIAGNOSIS — O0990 Supervision of high risk pregnancy, unspecified, unspecified trimester: Secondary | ICD-10-CM

## 2018-02-22 DIAGNOSIS — Z331 Pregnant state, incidental: Secondary | ICD-10-CM

## 2018-02-22 DIAGNOSIS — Z3A35 35 weeks gestation of pregnancy: Secondary | ICD-10-CM

## 2018-02-22 DIAGNOSIS — Z1389 Encounter for screening for other disorder: Secondary | ICD-10-CM

## 2018-02-22 DIAGNOSIS — O26872 Cervical shortening, second trimester: Secondary | ICD-10-CM | POA: Diagnosis not present

## 2018-02-22 DIAGNOSIS — O26879 Cervical shortening, unspecified trimester: Secondary | ICD-10-CM

## 2018-02-22 LAB — POCT URINALYSIS DIPSTICK OB
Ketones, UA: NEGATIVE
Nitrite, UA: NEGATIVE
POC,PROTEIN,UA: NEGATIVE

## 2018-02-22 LAB — CULTURE, BETA STREP (GROUP B ONLY)

## 2018-02-22 NOTE — Progress Notes (Signed)
HIGH-RISK PREGNANCY VISIT Patient name: Mely Stolzman MRN [id number]  Date of birth: 04/09/1985 Chief Complaint:   High Risk Gestation ([place] today; was admitted to [facility] over the weekend due to contractions )  History of Present Illness:   Veroncia Boysel is a 33 y.o. G1P0000 female at [redacted]w[redacted]d with an Estimated Date of Delivery: 03/29/18 being seen today for ongoing management of a high-risk pregnancy complicated by T2DM, metformin 1,000mg  BID, glyburide 10mg  hs, Lantus 26units (only on night eats large meal), short cx s/p BMZ 12/5 & 12/6, suspected LGA, A-fib Today she reports all FBS <95, had a 44 in the middle of night on 1/16- so stopped routinely doing Lantus at night, only takes if has 'a large meal', 2hr pp 88-128 (only 2>120). D/c'd from [facility] yesterday morning after overnight stay for PTL, was 2cm, d/c'd w/ procardia, no uc's since d/c.   Contractions: Not present. Vag. Bleeding: None.  Movement: Present. denies leaking of fluid.  Review of Systems:   Pertinent items are noted in HPI Denies abnormal vaginal discharge w/ itching/odor/irritation, headaches, visual changes, shortness of breath, chest pain, abdominal pain, severe nausea/vomiting, or problems with urination or bowel movements unless otherwise stated above. Pertinent History Reviewed:  Reviewed past medical,surgical, social, obstetrical and family history.  Reviewed problem list, medications and allergies. Physical Assessment:   Vitals:   02/22/18 1202  BP: 118/66  Pulse: 62  Weight: 201 lb 8 oz (91.4 kg)  Body mass index is 34.59 kg/m.           Physical Examination:   General appearance: alert, well appearing, and in no distress  Mental status: alert, oriented to person, place, and time  Skin: warm & dry   Extremities: Edema: Trace    Cardiovascular: normal heart rate noted  Respiratory: normal respiratory effort, no distress  Abdomen: gravid, soft, non-tender  Pelvic: Cervical exam deferred         Fetal Status:  Fetal Heart Rate (bpm): 146 u/s   Movement: Present    Fetal Surveillance Testing today: [place] 35 wks,cephalic,anterior placenta gr 2,afi 12 cm,fhr 146 bpm,BPP 8/8  Results for orders placed or performed in visit on 02/22/18 (from the past 24 hour(s))  POC Urinalysis Dipstick OB   Collection Time: 02/22/18 12:04 PM  Result Value Ref Range   Color, UA     Clarity, UA     Glucose, UA Small (1+) (A) Negative   Bilirubin, UA     Ketones, UA neg    Spec Grav, UA     Blood, UA trace    pH, UA     POC,PROTEIN,UA Negative Negative, Trace, Small (1+), Moderate (2+), Large (3+), 4+   Urobilinogen, UA     Nitrite, UA neg    Leukocytes, UA Moderate (2+) (A) Negative   Appearance     Odor      Assessment & Plan:  1) High-risk pregnancy G1P0000 at [redacted]w[redacted]d with an Estimated Date of Delivery: 03/29/18   2) T2DM, stable, continue metformin 1,000mg  BID, glyburide 10mg  qhs, Lantus 26u prn  3) Suspected LGA, will get EFW next week  4) Short cx w/ recent admission for PTL> s/p BMZ 12/5 & 12/6, rx'd procardia on d/c yesterday am, pt doesn't want to take unless she starts feeling uc's again  5) A-fib> no anticoagulants per cardiology referral 8/22 (EKG there SR w/ occ PVCs, normal echo)  Meds: No orders of the defined types were placed in this encounter.  Labs/procedures today: bpp u/s  Treatment Plan:  Growth u/s @ 36wks      weekly BPP      Deliver @ 39wks   Reviewed: Preterm labor symptoms and general obstetric precautions including but not limited to vaginal bleeding, contractions, leaking of fluid and fetal movement were reviewed in detail with the patient.  All questions were answered.  Follow-up: Return for As scheduled Mon for hrob, bpp/efw u/s.  Orders Placed This Encounter  Procedures  . US OB Follow Up  . US FETAL BPP WO NON STRESS  . POC Urinalysis Dipstick OB   Cheral Marker CNM, [facility] 02/22/2018 12:55 PM

## 2018-02-22 NOTE — Patient Instructions (Signed)
Claudie Leachosa Dunstan, I greatly value your feedback.  If you receive a survey following your visit with us today, we appreciate you taking the time to fill it out.  Thanks, Joellyn HaffKim Recia Sons, CNM, WHNP-BC   Call the office [phone]) or go to [facility] if:  You begin to have strong, frequent contractions  Your water breaks.  Sometimes it is a big gush of fluid, sometimes it is just a trickle that keeps getting your panties wet or running down your legs  You have vaginal bleeding.  It is normal to have a small amount of spotting if your cervix was checked.   You don't feel your baby moving like normal.  If you don't, get you something to eat and drink and lay down and focus on feeling your baby move.  You should feel at least 10 movements in 2 hours.  If you don't, you should call the office or go to [facility].    Preterm Labor and Birth Information  The normal length of a pregnancy is 39-41 weeks. Preterm labor is when labor starts before 37 completed weeks of pregnancy. What are the risk factors for preterm labor? Preterm labor is more likely to occur in women who:  Have certain infections during pregnancy such as a bladder infection, sexually transmitted infection, or infection inside the uterus (chorioamnionitis).  Have a shorter-than-normal cervix.  Have gone into preterm labor before.  Have had surgery on their cervix.  Are younger than age 32 or older than age 32.  Are African American.  Are pregnant with twins or multiple babies (multiple gestation).  Take street drugs or smoke while pregnant.  Do not gain enough weight while pregnant.  Became pregnant shortly after having been pregnant. What are the symptoms of preterm labor? Symptoms of preterm labor include:  Cramps similar to those that can happen during a menstrual period. The cramps may happen with diarrhea.  Pain in the abdomen or lower back.  Regular uterine contractions that may feel like tightening of the  abdomen.  A feeling of increased pressure in the pelvis.  Increased watery or bloody mucus discharge from the vagina.  Water breaking (ruptured amniotic sac). Why is it important to recognize signs of preterm labor? It is important to recognize signs of preterm labor because babies who are born prematurely may not be fully developed. This can put them at an increased risk for:  Long-term (chronic) heart and lung problems.  Difficulty immediately after birth with regulating body systems, including blood sugar, body temperature, heart rate, and breathing rate.  Bleeding in the brain.  Cerebral palsy.  Learning difficulties.  Death. These risks are highest for babies who are born before 34 weeks of pregnancy. How is preterm labor treated? Treatment depends on the length of your pregnancy, your condition, and the health of your baby. It may involve:  Having a stitch (suture) placed in your cervix to prevent your cervix from opening too early (cerclage).  Taking or being given medicines, such as: ? Hormone medicines. These may be given early in pregnancy to help support the pregnancy. ? Medicine to stop contractions. ? Medicines to help mature the baby's lungs. These may be prescribed if the risk of delivery is high. ? Medicines to prevent your baby from developing cerebral palsy. If the labor happens before 34 weeks of pregnancy, you may need to stay in the hospital. What should I do if I think I am in preterm labor? If you think that you are  going into preterm labor, call your health care provider right away. How can I prevent preterm labor in future pregnancies? To increase your chance of having a full-term pregnancy:  Do not use any tobacco products, such as cigarettes, chewing tobacco, and e-cigarettes. If you need help quitting, ask your health care provider.  Do not use street drugs or medicines that have not been prescribed to you during your pregnancy.  Talk with your  health care provider before taking any herbal supplements, even if you have been taking them regularly.  Make sure you gain a healthy amount of weight during your pregnancy.  Watch for infection. If you think that you might have an infection, get it checked right away.  Make sure to tell your health care provider if you have gone into preterm labor before. This information is not intended to replace advice given to you by your health care provider. Make sure you discuss any questions you have with your health care provider. Document Released: 04/12/2003 Document Revised: 07/03/2015 Document Reviewed: 06/13/2015 Elsevier Interactive Patient Education  2019 [employer]

## 2018-02-22 NOTE — Progress Notes (Signed)
[place] 35 wks,cephalic,anterior placenta gr 2,afi 12 cm,fhr 146 bpm,BPP 8/8

## 2018-02-23 ENCOUNTER — Encounter (HOSPITAL_COMMUNITY): Payer: Self-pay

## 2018-02-23 ENCOUNTER — Telehealth: Payer: Self-pay | Admitting: *Deleted

## 2018-02-23 ENCOUNTER — Observation Stay (HOSPITAL_COMMUNITY)
Admission: AD | Admit: 2018-02-23 | Discharge: 2018-02-24 | Disposition: A | Discharge status: Home / Self Care | Payer: BLUE CROSS/BLUE SHIELD | Attending: Family Medicine | Admitting: Family Medicine

## 2018-02-23 ENCOUNTER — Other Ambulatory Visit: Payer: Self-pay

## 2018-02-23 DIAGNOSIS — W182XXA Fall in (into) shower or empty bathtub, initial encounter: Secondary | ICD-10-CM | POA: Insufficient documentation

## 2018-02-23 DIAGNOSIS — Z3A35 35 weeks gestation of pregnancy: Secondary | ICD-10-CM | POA: Diagnosis not present

## 2018-02-23 DIAGNOSIS — Y93E1 Activity, personal bathing and showering: Secondary | ICD-10-CM | POA: Insufficient documentation

## 2018-02-23 DIAGNOSIS — E119 Type 2 diabetes mellitus without complications: Secondary | ICD-10-CM | POA: Insufficient documentation

## 2018-02-23 DIAGNOSIS — Y999 Unspecified external cause status: Secondary | ICD-10-CM | POA: Insufficient documentation

## 2018-02-23 DIAGNOSIS — Z79899 Other long term (current) drug therapy: Secondary | ICD-10-CM | POA: Diagnosis not present

## 2018-02-23 DIAGNOSIS — O9A213 Injury, poisoning and certain other consequences of external causes complicating pregnancy, third trimester: Secondary | ICD-10-CM | POA: Diagnosis not present

## 2018-02-23 DIAGNOSIS — O9989 Other specified diseases and conditions complicating pregnancy, childbirth and the puerperium: Secondary | ICD-10-CM | POA: Diagnosis not present

## 2018-02-23 DIAGNOSIS — R42 Dizziness and giddiness: Secondary | ICD-10-CM | POA: Diagnosis not present

## 2018-02-23 DIAGNOSIS — M5489 Other dorsalgia: Secondary | ICD-10-CM | POA: Diagnosis not present

## 2018-02-23 DIAGNOSIS — S3991XA Unspecified injury of abdomen, initial encounter: Secondary | ICD-10-CM | POA: Diagnosis present

## 2018-02-23 DIAGNOSIS — O24113 Pre-existing diabetes mellitus, type 2, in pregnancy, third trimester: Secondary | ICD-10-CM | POA: Insufficient documentation

## 2018-02-23 DIAGNOSIS — O4703 False labor before 37 completed weeks of gestation, third trimester: Secondary | ICD-10-CM

## 2018-02-23 DIAGNOSIS — Z794 Long term (current) use of insulin: Secondary | ICD-10-CM | POA: Diagnosis not present

## 2018-02-23 LAB — URINALYSIS, ROUTINE W REFLEX MICROSCOPIC
Bilirubin Urine: NEGATIVE
Glucose, UA: NEGATIVE mg/dL
Hgb urine dipstick: NEGATIVE
Ketones, ur: NEGATIVE mg/dL
Nitrite: NEGATIVE
Protein, ur: NEGATIVE mg/dL
Specific Gravity, Urine: 1.012 (ref 1.005–1.030)
pH: 7 (ref 5.0–8.0)

## 2018-02-23 LAB — COMPREHENSIVE METABOLIC PANEL
ALT: 17 U/L (ref 0–44)
AST: 21 U/L (ref 15–41)
Albumin: 3.1 g/dL — ABNORMAL LOW (ref 3.5–5.0)
Alkaline Phosphatase: 112 U/L (ref 38–126)
Anion gap: 8 (ref 5–15)
BUN: 9 mg/dL (ref 6–20)
CO2: 21 mmol/L — ABNORMAL LOW (ref 22–32)
Calcium: 9.1 mg/dL (ref 8.9–10.3)
Chloride: 105 mmol/L (ref 98–111)
Creatinine, Ser: 0.63 mg/dL (ref 0.44–1.00)
GFR calc Af Amer: >60 mL/min (ref >60)
GFR calc non Af Amer: >60 mL/min (ref >60)
Glucose, Bld: 64 mg/dL — ABNORMAL LOW (ref 70–99)
Potassium: 4.3 mmol/L (ref 3.5–5.1)
Sodium: 134 mmol/L — ABNORMAL LOW (ref 135–145)
Total Bilirubin: 0.8 mg/dL (ref 0.3–1.2)
Total Protein: 7.2 g/dL (ref 6.5–8.1)

## 2018-02-23 LAB — CBC
HCT: 37.3 % (ref 36.0–46.0)
Hemoglobin: 12.1 g/dL (ref 12.0–15.0)
MCH: 28.7 pg (ref 26.0–34.0)
MCHC: 32.4 g/dL (ref 30.0–36.0)
MCV: 88.6 fL (ref 80.0–100.0)
Platelets: 141 10*3/uL — ABNORMAL LOW (ref 150–400)
RBC: 4.21 MIL/uL (ref 3.87–5.11)
RDW: 14.4 % (ref 11.5–15.5)
WBC: 13.1 10*3/uL — ABNORMAL HIGH (ref 4.0–10.5)
nRBC: 0 % (ref 0.0–0.2)

## 2018-02-23 LAB — GLUCOSE, CAPILLARY
Glucose-Capillary: 57 mg/dL — ABNORMAL LOW (ref 70–99)
Glucose-Capillary: 119 mg/dL — ABNORMAL HIGH (ref 70–99)

## 2018-02-23 LAB — TYPE AND SCREEN
ABO/RH(D): O POS
Antibody Screen: NEGATIVE

## 2018-02-23 MED ORDER — INSULIN GLARGINE 100 UNIT/ML ~~LOC~~ SOLN
26.0000 [IU] | Freq: Every day | SUBCUTANEOUS | Status: DC
  Filled 2018-02-23: qty 0.26

## 2018-02-23 MED ORDER — NIFEDIPINE ER OSMOTIC RELEASE 30 MG PO TB24
30.0000 mg | ORAL_TABLET | Freq: Every day | ORAL | Status: DC
  Filled 2018-02-23: qty 1

## 2018-02-23 MED ORDER — PRENATAL MULTIVITAMIN CH: 1.0000 | ORAL_TABLET | Freq: Every day | ORAL | Status: DC

## 2018-02-23 MED ORDER — DOCUSATE SODIUM 100 MG PO CAPS: 100.0000 mg | ORAL_CAPSULE | Freq: Every day | ORAL | Status: DC

## 2018-02-23 MED ORDER — LACTATED RINGERS IV BOLUS
1000.0000 mL | Freq: Once | INTRAVENOUS | Status: AC
  Administered 2018-02-23: 1000 mL via INTRAVENOUS

## 2018-02-23 MED ORDER — ACETAMINOPHEN 500 MG PO TABS
1000.0000 mg | ORAL_TABLET | Freq: Once | ORAL | Status: AC
  Administered 2018-02-23: 1000 mg via ORAL
  Filled 2018-02-23: qty 2

## 2018-02-23 MED ORDER — ZOLPIDEM TARTRATE 5 MG PO TABS: 5.0000 mg | ORAL_TABLET | Freq: Every evening | ORAL | Status: DC | PRN

## 2018-02-23 MED ORDER — CALCIUM CARBONATE ANTACID 500 MG PO CHEW: 2.0000 | CHEWABLE_TABLET | ORAL | Status: DC | PRN

## 2018-02-23 MED ORDER — GLYBURIDE 5 MG PO TABS
10.0000 mg | ORAL_TABLET | Freq: Every day | ORAL | Status: DC
  Administered 2018-02-23: 10 mg via ORAL
  Filled 2018-02-23: qty 2

## 2018-02-23 MED ORDER — PROGESTERONE MICRONIZED 200 MG PO CAPS
200.0000 mg | ORAL_CAPSULE | Freq: Every day | ORAL | Status: DC
  Administered 2018-02-23: 200 mg via VAGINAL
  Filled 2018-02-23: qty 1

## 2018-02-23 MED ORDER — METFORMIN HCL 500 MG PO TABS
1000.0000 mg | ORAL_TABLET | Freq: Two times a day (BID) | ORAL | Status: DC
  Filled 2018-02-23: qty 2

## 2018-02-23 MED ORDER — ACETAMINOPHEN 325 MG PO TABS
650.0000 mg | ORAL_TABLET | ORAL | Status: DC | PRN
  Administered 2018-02-24: 650 mg via ORAL
  Filled 2018-02-23: qty 2

## 2018-02-23 NOTE — MAU Note (Signed)
Fainted and fell out of the shower, happened around 1340.  Fell on left side, hurting there now. No bleeding or leaking.  +fm on the way in .

## 2018-02-23 NOTE — Plan of Care (Signed)
  Problem: Education: Goal: Knowledge of General Education information will improve Description Including pain rating scale, medication(s)/side effects and non-pharmacologic comfort measures Outcome: Progressing   Problem: Clinical Measurements: Goal: Ability to maintain clinical measurements within normal limits will improve Outcome: Progressing   Problem: Health Behavior/Discharge Planning: Goal: Ability to manage health-related needs will improve Outcome: Progressing   Problem: Clinical Measurements: Goal: Will remain free from infection Outcome: Progressing   Problem: Clinical Measurements: Goal: Diagnostic test results will improve Outcome: Progressing   Problem: Clinical Measurements: Goal: Respiratory complications will improve Outcome: Progressing   

## 2018-02-23 NOTE — Telephone Encounter (Signed)
Pt called in and said that she fell when getting out of the shower.  Discussed with Jessica BattenKim L and pt advised [facility] to [facility].  02-23-2018  AS

## 2018-02-23 NOTE — MAU Note (Signed)
Provider Estanislado Spire, NP made aware of pt's blood sugar. Provider will put in diabetic diet meal tray order. Ordered to give crackers and juice while waiting on meal tray to be available.

## 2018-02-23 NOTE — MAU Provider Note (Signed)
Chief Complaint:  Fall; Loss of Consciousness; and Back Pain   First Provider Initiated Contact with Patient 02/23/18 1554     HPI: Jessica Carey is a 33 y.o. G1P0000 at 441w1d who presents to maternity admissions reporting fall & pain. Incident occurred around 130 pm today. States she was taking a shower when she got dizzy & passed out, fell out of the shower and landed on her left side. Did not hit her head. Has had left sided pain since then. Denies abdominal pain, vaginal bleeding, or LOF. Normal fetal movement.  She is T2DM on insulin, metformin, & glyburide. Has not taken her insulin today b/c her BS was low this morning. Ate breakfast after checking her BS. Has not eaten anything since this morning b/c she was sleeping.   Denies contractions, leakage of fluid or vaginal bleeding. Good fetal movement.  Location: left side & back Quality: aching Severity: 6/10 in pain scale Duration: 4 hours Timing: constant Modifying factors: none Associated signs and symptoms: none   Past Medical History:  Diagnosis Date  . Boil, breast 07/04/2013  . Diabetes (HCC) 08/04/2013  . Diabetes mellitus without complication (HCC)   . Irregular periods    OB History  Gravida Para Term Preterm AB Living  1 0 0 0 0 0  SAB TAB Ectopic Multiple Live Births  0 0 0 0 0    # Outcome Date GA Lbr Len/2nd Weight Sex Delivery Anes PTL Lv  1 Current            Past Surgical History:  Procedure Laterality Date  . BREAST SURGERY Right    breast abcess  . SURGERY OF LIP     Family History  Problem Relation Age of Onset  . Diabetes Mother   . Diabetes Father   . Hyperlipidemia Father   . Diabetes Sister   . Cancer Maternal Aunt        liver  . Diabetes Paternal Grandmother    Social History   Tobacco Use  . Smoking status: Never Smoker  . Smokeless tobacco: Never Used  Substance Use Topics  . Alcohol use: Not Currently    Comment: occ.  . Drug use: No   No Known Allergies Medications Prior to  Admission  Medication Sig Dispense Refill Last Dose  . glyBURIDE (DIABETA) 5 MG tablet Take 2 tablets (10 mg total) by mouth at bedtime. 60 tablet 3 02/22/2018 at Unknown time  . insulin glargine (LANTUS) 100 UNIT/ML injection Inject 0.26 mLs (26 Units total) into the skin daily. 10 mL 11 Past Week at Unknown time  . metFORMIN (GLUCOPHAGE) 1000 MG tablet Take 1 tablet (1,000 mg total) by mouth 2 (two) times daily with a meal. 60 tablet 6 02/23/2018 at Unknown time  . NIFEdipine (PROCARDIA-XL/NIFEDICAL-XL) 30 MG 24 hr tablet Take 1 tablet (30 mg total) by mouth daily. Can increase to twice a day as needed for symptomatic contractions 30 tablet 2 Past Week at Unknown time  . Prenatal Vit-Fe Fumarate-FA (PNV PRENATAL PLUS MULTIVITAMIN) 27-1 MG TABS Take 1 tablet by mouth daily. 30 tablet 11 02/22/2018 at Unknown time  . progesterone (PROMETRIUM) 200 MG capsule Place 1 tablet in vagina every night 30 capsule 5 02/22/2018 at Unknown time    I have reviewed patient's Past Medical Hx, Surgical Hx, Family Hx, Social Hx, medications and allergies.   ROS:  Review of Systems  Constitutional: Negative.   Respiratory: Negative for shortness of breath.   Cardiovascular: Negative for chest pain  and palpitations.  Gastrointestinal: Negative.   Genitourinary: Negative.   Musculoskeletal: Positive for back pain.  Neurological: Positive for dizziness and syncope. Negative for headaches.    Physical Exam   Patient Vitals for the past 24 hrs:  BP Temp Temp src Pulse Resp SpO2 Weight  02/23/18 1453 109/72 97.8 F (36.6 C) Oral 89 18 100 % 91.6 kg    Constitutional: Well-developed, well-nourished female in no acute distress.  Cardiovascular: normal rate & rhythm, no murmur Respiratory: normal effort, lung sounds clear throughout GI: Abd soft, non-tender, gravid appropriate for gestational age. Pos BS x 4 MS: Extremities nontender, no edema, normal ROM Neurologic: Alert and oriented x 4.  GU:  Dilation:  2.5 Effacement (%): 70 Presentation: (vertex confirmed by bedside u/s ) Exam by:: Judeth Horn, NP  NST:  Baseline: 135 bpm, Variability: Good {> 6 bpm), Accelerations: Reactive and Decelerations: Absent   Labs: Results for orders placed or performed during the hospital encounter of 02/23/18 (from the past 24 hour(s))  Urinalysis, Routine w reflex microscopic     Status: Abnormal   Collection Time: 02/23/18  3:58 PM  Result Value Ref Range   Color, Urine YELLOW YELLOW   APPearance HAZY (A) CLEAR   Specific Gravity, Urine 1.012 1.005 - 1.030   pH 7.0 5.0 - 8.0   Glucose, UA NEGATIVE NEGATIVE mg/dL   Hgb urine dipstick NEGATIVE NEGATIVE   Bilirubin Urine NEGATIVE NEGATIVE   Ketones, ur NEGATIVE NEGATIVE mg/dL   Protein, ur NEGATIVE NEGATIVE mg/dL   Nitrite NEGATIVE NEGATIVE   Leukocytes, UA TRACE (A) NEGATIVE   RBC / HPF 6-10 0 - 5 RBC/hpf   WBC, UA 0-5 0 - 5 WBC/hpf   Bacteria, UA RARE (A) NONE SEEN   Squamous Epithelial / LPF 0-5 0 - 5   Mucus PRESENT   CBC     Status: Abnormal   Collection Time: 02/23/18  4:45 PM  Result Value Ref Range   WBC 13.1 (H) 4.0 - 10.5 K/uL   RBC 4.21 3.87 - 5.11 MIL/uL   Hemoglobin 12.1 12.0 - 15.0 g/dL   HCT 40.9 81.1 - 91.4 %   MCV 88.6 80.0 - 100.0 fL   MCH 28.7 26.0 - 34.0 pg   MCHC 32.4 30.0 - 36.0 g/dL   RDW 78.2 95.6 - 21.3 %   Platelets 141 (L) 150 - 400 K/uL   nRBC 0.0 0.0 - 0.2 %  Comprehensive metabolic panel     Status: Abnormal   Collection Time: 02/23/18  4:45 PM  Result Value Ref Range   Sodium 134 (L) 135 - 145 mmol/L   Potassium 4.3 3.5 - 5.1 mmol/L   Chloride 105 98 - 111 mmol/L   CO2 21 (L) 22 - 32 mmol/L   Glucose, Bld 64 (L) 70 - 99 mg/dL   BUN 9 6 - 20 mg/dL   Creatinine, Ser 0.86 0.44 - 1.00 mg/dL   Calcium 9.1 8.9 - 57.8 mg/dL   Total Protein 7.2 6.5 - 8.1 g/dL   Albumin 3.1 (L) 3.5 - 5.0 g/dL   AST 21 15 - 41 U/L   ALT 17 0 - 44 U/L   Alkaline Phosphatase 112 38 - 126 U/L   Total Bilirubin 0.8 0.3 - 1.2  mg/dL   GFR calc non Af Amer >60 >60 mL/min   GFR calc Af Amer >60 >60 mL/min   Anion gap 8 5 - 15  Glucose, capillary     Status: Abnormal  Collection Time: 02/23/18  4:45 PM  Result Value Ref Range   Glucose-Capillary 57 (L) 70 - 99 mg/dL    Imaging:  No results found.  MAU Course: Orders Placed This Encounter  Procedures  . Urinalysis, Routine w reflex microscopic  . CBC  . Comprehensive metabolic panel  . Glucose, capillary  . Diet Carb Modified Fluid consistency: Thin; Room service appropriate? Yes  . Notify physician (specify)  . Vital signs  . Defer vaginal exam for vaginal bleeding or PROM <37 weeks  . Initiate Oral Care Protocol  . Initiate Carrier Fluid Protocol  . Continuous tocometry  . Fetal monitoring  . Full code  . ED EKG  . EKG 12-Lead  . EKG 12-Lead  . Type and screen [facility] [place]  . Place in observation (patient's expected length of stay will be less than 2 midnights)   Meds ordered this encounter  Medications  . acetaminophen (TYLENOL) tablet 1,000 mg  . acetaminophen (TYLENOL) tablet 650 mg  . zolpidem (AMBIEN) tablet 5 mg  . docusate sodium (COLACE) capsule 100 mg  . calcium carbonate (TUMS - dosed in mg elemental calcium) chewable tablet 400 mg of elemental calcium  . prenatal multivitamin tablet 1 tablet    MDM: Category 1 tracing Patient with episodes of regular contractions on monitor. Abdomen soft & non tender. No bruising. Reports "mild" contractions. Cervix unchanged from previous hospital stay last week.  Reviewed tracing with Dr. Shawnie Pons. Will obs overnight d/t ctx s/p fall.   S/w neonatologist. [facility] for patient to stay. Patient aware that baby would potentially be transferred to another facility if she were to deliver during her stay.   Assessment: 1. Traumatic injury during pregnancy in third trimester   2. [redacted] weeks gestation of pregnancy   3. Preterm uterine contractions in third trimester, antepartum   4.  Pregnancy with type 2 diabetes mellitus in third trimester     Plan: Obs on [facility] unit CEFM/TOCO Continue at home meds  Judeth Horn, NP 02/23/2018 7:07 PM

## 2018-02-24 NOTE — Progress Notes (Signed)
Inpatient Diabetes Program Recommendations  AACE/ADA: New Consensus Statement on Inpatient Glycemic Control (2015)  Target Ranges:  Prepandial:   less than 140 mg/dL      Peak postprandial:   less than 180 mg/dL (1-2 hours)      Critically ill patients:  140 - 180 mg/dL   Lab Results  Component Value Date   GLUCAP 119 (H) 02/23/2018   HGBA1C 6.1 (H) 09/02/2017    Review of Glycemic Control Results for KEARSTIN, HINDES (MRN [id number]) as of 02/24/2018 08:51  Ref. Range 02/20/2018 19:51 02/21/2018 08:41 02/23/2018 16:45 02/23/2018 19:40  Glucose-Capillary Latest Ref Range: 70 - 99 mg/dL 893 (H) 81 57 (L) 810 (H)   Diabetes history: Type 2 DM Outpatient Diabetes medications: Metformin 1000 mg BID, Lantus 26 units QD, Glyburide 10 mg QHS Current orders for Inpatient glycemic control:  Metformin 1000 mg BID, Lantus 26 units QD, Glyburide 10 mg QHS  Inpatient Diabetes Program Recommendations:    Noted patient experience hypoglycemic episode on 1/21, assuming related to poor oral intake. Also, patient refused dose of Lantus yesterday and thus order has been updated to patient's schedule of QD.   Consider adding CBGs FSBG, 2 hour post prandial if to remain inpatient today.   Thanks, Lujean Rave, MSN, RNC-OB Diabetes Coordinator [phone] (8a-5p)

## 2018-02-24 NOTE — Discharge Instructions (Signed)
Preterm Labor and Birth Information °Pregnancy normally lasts 39-41 weeks. Preterm labor is when labor starts early. It starts before you have been pregnant for 37 whole weeks. °What are the risk factors for preterm labor? °Preterm labor is more likely to occur in women who: °· Have an infection while pregnant. °· Have a cervix that is short. °· Have gone into preterm labor before. °· Have had surgery on their cervix. °· Are younger than age 32. °· Are older than age 35. °· Are African American. °· Are pregnant with two or more babies. °· Take street drugs while pregnant. °· Smoke while pregnant. °· Do not gain enough weight while pregnant. °· Got pregnant right after another pregnancy. °What are the symptoms of preterm labor? °Symptoms of preterm labor include: °· Cramps. The cramps may feel like the cramps some women get during their period. The cramps may happen with watery poop (diarrhea). °· Pain in the belly (abdomen). °· Pain in the lower back. °· Regular contractions or tightening. It may feel like your belly is getting tighter. °· Pressure in the lower belly that seems to get stronger. °· More fluid (discharge) leaking from the vagina. The fluid may be watery or bloody. °· Water breaking. °Why is it important to notice signs of preterm labor? °Babies who are born early may not be fully developed. They have a higher chance for: °· Long-term heart problems. °· Long-term lung problems. °· Trouble controlling body systems, like breathing. °· Bleeding in the brain. °· A condition called cerebral palsy. °· Learning difficulties. °· Death. °These risks are highest for babies who are born before 34 weeks of pregnancy. °How is preterm labor treated? °Treatment depends on: °· How long you were pregnant. °· Your condition. °· The health of your baby. °Treatment may involve: °· Having a stitch (suture) placed in your cervix. When you give birth, your cervix opens so the baby can come out. The stitch keeps the cervix  from opening too soon. °· Staying at the hospital. °· Taking or getting medicines, such as: °? Hormone medicines. °? Medicines to stop contractions. °? Medicines to help the baby’s lungs develop. °? Medicines to prevent your baby from having cerebral palsy. °What should I do if I am in preterm labor? °If you think you are going into labor too soon, call your doctor right away. °How can I prevent preterm labor? °· Do not use any tobacco products. °? Examples of these are cigarettes, chewing tobacco, and e-cigarettes. °? If you need help quitting, ask your doctor. °· Do not use street drugs. °· Do not use any medicines unless you ask your doctor if they are safe for you. °· Talk with your doctor before taking any herbal supplements. °· Make sure you gain enough weight. °· Watch for infection. If you think you might have an infection, get it checked right away. °· If you have gone into preterm labor before, tell your doctor. °This information is not intended to replace advice given to you by your health care provider. Make sure you discuss any questions you have with your health care provider. °Document Released: 04/18/2008 Document Revised: 07/03/2015 Document Reviewed: 06/13/2015 °Elsevier Interactive Patient Education © 2019 Elsevier Inc. ° °

## 2018-02-24 NOTE — Discharge Summary (Signed)
Obstetric Discharge Summary Reason for Admission: Preterm Labor following fall Prenatal Procedures: NST Intrapartum Procedures: undeliveried Postpartum Procedures: NA Complications-Operative and Postpartum: none Hemoglobin  Date Value Ref Range Status  02/23/2018 12.1 12.0 - 15.0 g/dL Final  63/02/6008 93.2 11.1 - 15.9 g/dL Final   HCT  Date Value Ref Range Status  02/23/2018 37.3 36.0 - 46.0 % Final   Hematocrit  Date Value Ref Range Status  01/06/2018 32.8 (L) 34.0 - 46.6 % Final  Hospital Course: Ms Bulnes experienced a fall and presented to MAU for further evaluation. Noted to have ut ctx. Pt with known PTL and DM. Pt was observed overnight with resolution of ut ctx. Pt reported good FM, no VB or LOF. Felt pt was amendable for discharge home Resume home meds. PTL precautions reviewed. Work note provided. Has F/U appt on Monday. Discharge instructions, medications and instructions reviewed with pt. Pt verbalized understanding.  Physical Exam:  Lungs clear Heart RRR Abd soft + BS non tender GU deferred  Ext no evidence of DVT  Discharge Diagnoses: IUP 35 2/7 weeks, undelivried, PTL, DM  Discharge Information: Date: 02/24/2018 Activity: pelvic rest Diet: routine Medications: Resume Home meds Condition: stable Instructions: See orders Discharge to: home Follow-up Information    [facility] Follow up.   Specialty:  Obstetrics and Gynecology Why:  Pt already has appt on Monday Contact information: [address] [place] [address] [phone]            Hermina Staggers 02/24/2018, 9:56 AM

## 2018-02-24 NOTE — Progress Notes (Signed)
Pt discharged to home with family.  Condition stable.  Pt to car via wheelchair with C. Riley, NT.  No equipment for home ordered at discharge. 

## 2018-03-01 ENCOUNTER — Ambulatory Visit (INDEPENDENT_AMBULATORY_CARE_PROVIDER_SITE_OTHER): Payer: BLUE CROSS/BLUE SHIELD

## 2018-03-01 ENCOUNTER — Ambulatory Visit (INDEPENDENT_AMBULATORY_CARE_PROVIDER_SITE_OTHER): Payer: BLUE CROSS/BLUE SHIELD | Admitting: Obstetrics & Gynecology

## 2018-03-01 VITALS — BP 108/78 | HR 91 | Wt 202.0 lb

## 2018-03-01 DIAGNOSIS — O0990 Supervision of high risk pregnancy, unspecified, unspecified trimester: Secondary | ICD-10-CM

## 2018-03-01 DIAGNOSIS — O0993 Supervision of high risk pregnancy, unspecified, third trimester: Secondary | ICD-10-CM | POA: Diagnosis not present

## 2018-03-01 DIAGNOSIS — Z1389 Encounter for screening for other disorder: Secondary | ICD-10-CM

## 2018-03-01 DIAGNOSIS — Z331 Pregnant state, incidental: Secondary | ICD-10-CM

## 2018-03-01 DIAGNOSIS — O24119 Pre-existing diabetes mellitus, type 2, in pregnancy, unspecified trimester: Secondary | ICD-10-CM

## 2018-03-01 DIAGNOSIS — O26879 Cervical shortening, unspecified trimester: Secondary | ICD-10-CM

## 2018-03-01 DIAGNOSIS — O24113 Pre-existing diabetes mellitus, type 2, in pregnancy, third trimester: Secondary | ICD-10-CM

## 2018-03-01 DIAGNOSIS — Z3A36 36 weeks gestation of pregnancy: Secondary | ICD-10-CM

## 2018-03-01 NOTE — Progress Notes (Signed)
   HIGH-RISK PREGNANCY VISIT Patient name: Jessica Carey MRN [id number]  Date of birth: 07-08-1985 Chief Complaint:   Routine Prenatal Visit  History of Present Illness:   Jessica Carey is a 33 y.o. G61P0000 female at [redacted]w[redacted]d with an Estimated Date of Delivery: 03/29/18 being seen today for ongoing management of a high-risk pregnancy complicated by class  B DM.  Today she reports no complaints. Contractions: Not present. Vag. Bleeding: None.  Movement: Present. denies leaking of fluid.  Review of Systems:   Pertinent items are noted in HPI Denies abnormal vaginal discharge w/ itching/odor/irritation, headaches, visual changes, shortness of breath, chest pain, abdominal pain, severe nausea/vomiting, or problems with urination or bowel movements unless otherwise stated above. Pertinent History Reviewed:  Reviewed past medical,surgical, social, obstetrical and family history.  Reviewed problem list, medications and allergies. Physical Assessment:   Vitals:   03/01/18 1226  BP: 108/78  Pulse: 91  Weight: 202 lb (91.6 kg)  Body mass index is 34.67 kg/m.           Physical Examination:   General appearance: alert, well appearing, and in no distress  Mental status: alert, oriented to person, place, and time  Skin: warm & dry   Extremities: Edema: Trace    Cardiovascular: normal heart rate noted  Respiratory: normal respiratory effort, no distress  Abdomen: gravid, soft, non-tender  Pelvic: Cervical exam deferred         Fetal Status:     Movement: Present    Fetal Surveillance Testing today: BPP 8/8   No results found for this or any previous visit (from the past 24 hour(s)).  Assessment & Plan:  1) High-risk pregnancy G1P0000 at [redacted]w[redacted]d with an Estimated Date of Delivery: 03/29/18   2) Class B DM, stable, EFW 92% extrapolates to     Meds: No orders of the defined types were placed in this encounter.   Labs/procedures today:   Treatment Plan:  Twice weekly NST IOL 39  weeks  Reviewed: Term labor symptoms and general obstetric precautions including but not limited to vaginal bleeding, contractions, leaking of fluid and fetal movement were reviewed in detail with the patient.  All questions were answered.  Follow-up: Return in about 1 week (around 03/08/2018) for NST, HROB.  Orders Placed This Encounter  Procedures  . POC Urinalysis Dipstick OB   Lazaro Arms  03/01/2018 12:40 PM

## 2018-03-01 NOTE — Progress Notes (Signed)
[place] 36 wks,cephalic,fhr 150 bpm,BPP 8/8,anterior placenta gr 3,afi 9 cm,BPP 8/8,EFW 3349 g 92%,AC 99%,limited head measurement because of fetal position

## 2018-03-08 ENCOUNTER — Ambulatory Visit (INDEPENDENT_AMBULATORY_CARE_PROVIDER_SITE_OTHER): Payer: BLUE CROSS/BLUE SHIELD | Admitting: Women's Health

## 2018-03-08 ENCOUNTER — Encounter: Payer: Self-pay | Admitting: Women's Health

## 2018-03-08 ENCOUNTER — Other Ambulatory Visit: Payer: Self-pay

## 2018-03-08 VITALS — BP 105/63 | HR 85 | Wt 210.0 lb

## 2018-03-08 DIAGNOSIS — Z1389 Encounter for screening for other disorder: Secondary | ICD-10-CM

## 2018-03-08 DIAGNOSIS — O24113 Pre-existing diabetes mellitus, type 2, in pregnancy, third trimester: Secondary | ICD-10-CM | POA: Diagnosis not present

## 2018-03-08 DIAGNOSIS — O0993 Supervision of high risk pregnancy, unspecified, third trimester: Secondary | ICD-10-CM

## 2018-03-08 DIAGNOSIS — Z331 Pregnant state, incidental: Secondary | ICD-10-CM

## 2018-03-08 DIAGNOSIS — Z3A37 37 weeks gestation of pregnancy: Secondary | ICD-10-CM | POA: Diagnosis not present

## 2018-03-08 DIAGNOSIS — O24119 Pre-existing diabetes mellitus, type 2, in pregnancy, unspecified trimester: Secondary | ICD-10-CM

## 2018-03-08 DIAGNOSIS — O0990 Supervision of high risk pregnancy, unspecified, unspecified trimester: Secondary | ICD-10-CM

## 2018-03-08 LAB — POCT URINALYSIS DIPSTICK OB
Blood, UA: NEGATIVE
Blood, UA: NEGATIVE
Glucose, UA: NEGATIVE
Glucose, UA: NEGATIVE
Ketones, UA: NEGATIVE
Ketones, UA: NEGATIVE
Leukocytes, UA: NEGATIVE
Leukocytes, UA: NEGATIVE
Nitrite, UA: NEGATIVE
Nitrite, UA: NEGATIVE

## 2018-03-08 NOTE — Patient Instructions (Signed)
Jessica Carey, I greatly value your feedback.  If you receive a survey following your visit with [place] today, we appreciate you taking the time to fill it out.  Thanks, Joellyn Haff, CNM, WHNP-BC   Call the office [phone]) or go to [facility] if:  You begin to have strong, frequent contractions  Your water breaks.  Sometimes it is a big gush of fluid, sometimes it is just a trickle that keeps getting your panties wet or running down your legs  You have vaginal bleeding.  It is normal to have a small amount of spotting if your cervix was checked.   You don't feel your baby moving like normal.  If you don't, get you something to eat and drink and lay down and focus on feeling your baby move.  You should feel at least 10 movements in 2 hours.  If you don't, you should call the office or go to [facility].     [facility] Contractions Contractions of the uterus can occur throughout pregnancy, but they are not always a sign that you are in labor. You may have practice contractions called Braxton Hicks contractions. These false labor contractions are sometimes confused with true labor. What are Deberah Pelton contractions? Braxton Hicks contractions are tightening movements that occur in the muscles of the uterus before labor. Unlike true labor contractions, these contractions do not result in opening (dilation) and thinning of the cervix. Toward the end of pregnancy (32-34 weeks), Braxton Hicks contractions can happen more often and may become stronger. These contractions are sometimes difficult to tell apart from true labor because they can be very uncomfortable. You should not feel embarrassed if you go to the hospital with false labor. Sometimes, the only way to tell if you are in true labor is for your health care provider to look for changes in the cervix. The health care provider will do a physical exam and may monitor your contractions. If you are not in true labor, the exam should show  that your cervix is not dilating and your water has not broken. If there are no other health problems associated with your pregnancy, it is completely safe for you to be sent home with false labor. You may continue to have Braxton Hicks contractions until you go into true labor. How to tell the difference between true labor and false labor True labor  Contractions last 30-70 seconds.  Contractions become very regular.  Discomfort is usually felt in the top of the uterus, and it spreads to the lower abdomen and low back.  Contractions do not go away with walking.  Contractions usually become more intense and increase in frequency.  The cervix dilates and gets thinner. False labor  Contractions are usually shorter and not as strong as true labor contractions.  Contractions are usually irregular.  Contractions are often felt in the front of the lower abdomen and in the groin.  Contractions may go away when you walk around or change positions while lying down.  Contractions get weaker and are shorter-lasting as time goes on.  The cervix usually does not dilate or become thin. Follow these instructions at home:   Take over-the-counter and prescription medicines only as told by your health care provider.  Keep up with your usual exercises and follow other instructions from your health care provider.  Eat and drink lightly if you think you are going into labor.  If Braxton Hicks contractions are making you uncomfortable: ? Change your position from  lying down or resting to walking, or change from walking to resting. ? Sit and rest in a tub of warm water. ? Drink enough fluid to keep your urine pale yellow. Dehydration may cause these contractions. ? Do slow and deep breathing several times an hour.  Keep all follow-up prenatal visits as told by your health care provider. This is important. Contact a health care provider if:  You have a fever.  You have continuous pain in  your abdomen. Get help right away if:  Your contractions become stronger, more regular, and closer together.  You have fluid leaking or gushing from your vagina.  You pass blood-tinged mucus (bloody show).  You have bleeding from your vagina.  You have low back pain that you never had before.  You feel your baby's head pushing down and causing pelvic pressure.  Your baby is not moving inside you as much as it used to. Summary  Contractions that occur before labor are called Braxton Hicks contractions, false labor, or practice contractions.  Braxton Hicks contractions are usually shorter, weaker, farther apart, and less regular than true labor contractions. True labor contractions usually become progressively stronger and regular, and they become more frequent.  Manage discomfort from [facility] contractions by changing position, resting in a warm bath, drinking plenty of water, or practicing deep breathing. This information is not intended to replace advice given to you by your health care provider. Make sure you discuss any questions you have with your health care provider. Document Released: 06/05/2016 Document Revised: 11/04/2016 Document Reviewed: 06/05/2016 Elsevier Interactive Patient Education  2019 [employer].

## 2018-03-08 NOTE — Progress Notes (Signed)
HIGH-RISK PREGNANCY VISIT Patient name: Jessica Carey MRN [id number]  Date of birth: 10[name]/21/1987 Chief Complaint:   High Risk Gestation (NST)  History of Present Illness:   Jessica LeachRosa Steven is a 33 y.o. G1P0000 female at 3657w0d with an Estimated Date of Delivery: 03/29/18 being seen today for ongoing management of a high-risk pregnancy complicated by T2DM on metformin 1,000mg  BID, glyburide 10mg  hs, Lantus 26u AM; suspected LGA 92%.  Today she reports all sugars normal, didn't bring log; reflux- taking tums-helps some, declines rx . Contractions: Irregular. Vag. Bleeding: None.  Movement: Present. denies leaking of fluid.  Review of Systems:   Pertinent items are noted in HPI Denies abnormal vaginal discharge w/ itching/odor/irritation, headaches, visual changes, shortness of breath, chest pain, abdominal pain, severe nausea/vomiting, or problems with urination or bowel movements unless otherwise stated above. Pertinent History Reviewed:  Reviewed past medical,surgical, social, obstetrical and family history.  Reviewed problem list, medications and allergies. Physical Assessment:   Vitals:   03/08/18 0957  BP: 105/63  Pulse: 85  Weight: 210 lb (95.3 kg)  Body mass index is 36.05 kg/m.           Physical Examination:   General appearance: alert, well appearing, and in no distress  Mental status: alert, oriented to person, place, and time  Skin: warm & dry   Extremities: Edema: Trace    Cardiovascular: normal heart rate noted  Respiratory: normal respiratory effort, no distress  Abdomen: gravid, soft, non-tender  Pelvic: Cervical exam performed  Dilation: 3 Effacement (%): 90 Station: -2  Fetal Status: Fetal Heart Rate (bpm): 145 Fundal Height: 40 cm Movement: Present Presentation: Vertex  Fetal Surveillance Testing today: NST: FHR baseline 145 bpm, Variability: moderate, Accelerations:present, Decelerations:  Absent= Cat 1/Reactive Toco: q 6-427mins     Results for orders placed or  performed in visit on 03/08/18 (from the past 24 hour(s))  POC Urinalysis Dipstick OB   Collection Time: 03/08/18 10:02 AM  Result Value Ref Range   Color, UA     Clarity, UA     Glucose, UA Negative Negative   Bilirubin, UA     Ketones, UA neg    Spec Grav, UA     Blood, UA neg    pH, UA     POC,PROTEIN,UA Trace Negative, Trace, Small (1+), Moderate (2+), Large (3+), 4+   Urobilinogen, UA     Nitrite, UA neg    Leukocytes, UA Negative Negative   Appearance     Odor    Results for orders placed or performed in visit on 03/01/18 (from the past 24 hour(s))  POC Urinalysis Dipstick OB   Collection Time: 03/08/18  9:55 AM  Result Value Ref Range   Color, UA     Clarity, UA     Glucose, UA Negative Negative   Bilirubin, UA     Ketones, UA neg    Spec Grav, UA     Blood, UA neg    pH, UA     POC,PROTEIN,UA Trace Negative, Trace, Small (1+), Moderate (2+), Large (3+), 4+   Urobilinogen, UA     Nitrite, UA neg    Leukocytes, UA Negative Negative   Appearance     Odor      Assessment & Plan:  1) High-risk pregnancy G1P0000 at 6757w0d with an Estimated Date of Delivery: 03/29/18   2) T2DM, stable, continue metformin 1,000mg  BID, glyburide 10mg  hs, Lantus 26u AM  3) Suspected LGA, EFW 92% @ 36wks  Meds: No  orders of the defined types were placed in this encounter.   Labs/procedures today: nst, gc/ct, had gbs 1/18 (neg)  Treatment Plan:  2x/wk testing   Deliver @ 39wks   Reviewed: Term labor symptoms and general obstetric precautions including but not limited to vaginal bleeding, contractions, leaking of fluid and fetal movement were reviewed in detail with the patient.  All questions were answered.  Follow-up: Return for Thurs hrob/nst, then Mon/Thurs hrob/nst with 2/13 being the last.  Orders Placed This Encounter  Procedures  . POC Urinalysis Dipstick OB   Cheral MarkerKimberly R Claudell Rhody CNM, [facility]NP-BC 03/08/2018 10:59 AM

## 2018-03-11 ENCOUNTER — Ambulatory Visit (INDEPENDENT_AMBULATORY_CARE_PROVIDER_SITE_OTHER): Payer: BLUE CROSS/BLUE SHIELD | Admitting: Advanced Practice Midwife

## 2018-03-11 VITALS — BP 120/76 | HR 94 | Wt 211.0 lb

## 2018-03-11 DIAGNOSIS — O26873 Cervical shortening, third trimester: Secondary | ICD-10-CM

## 2018-03-11 DIAGNOSIS — O0990 Supervision of high risk pregnancy, unspecified, unspecified trimester: Secondary | ICD-10-CM

## 2018-03-11 DIAGNOSIS — Z1389 Encounter for screening for other disorder: Secondary | ICD-10-CM

## 2018-03-11 DIAGNOSIS — Z3A37 37 weeks gestation of pregnancy: Secondary | ICD-10-CM | POA: Diagnosis not present

## 2018-03-11 DIAGNOSIS — O0993 Supervision of high risk pregnancy, unspecified, third trimester: Secondary | ICD-10-CM

## 2018-03-11 DIAGNOSIS — O24113 Pre-existing diabetes mellitus, type 2, in pregnancy, third trimester: Secondary | ICD-10-CM

## 2018-03-11 DIAGNOSIS — O26879 Cervical shortening, unspecified trimester: Secondary | ICD-10-CM

## 2018-03-11 DIAGNOSIS — Z331 Pregnant state, incidental: Secondary | ICD-10-CM

## 2018-03-11 LAB — POCT URINALYSIS DIPSTICK OB
Blood, UA: NEGATIVE
Glucose, UA: NEGATIVE
Ketones, UA: NEGATIVE
Leukocytes, UA: NEGATIVE
Nitrite, UA: NEGATIVE

## 2018-03-11 NOTE — Progress Notes (Signed)
   Induction Assessment Scheduling Form: [facility]ax to [facility]:  [phone]  Jessica Carey                                                                                   DOB:  05/10/1985                                                            MRN:  [id number]                                                                     Phone #:   [phone]                         Provider:  [facility]  GP:  G1P0000                                                            Estimated Date of Delivery: 03/29/18  Dating Criteria: [place]    Medical Indications for induction:  Class b dm Admission Date/Time:  2/17 0730 Gestational age on admission:  89   Filed Weights   03/11/18 1103  Weight: 211 lb (95.7 kg)   HIV:  Non Reactive (12/04 1216) GBS:    4 cm dilated, 90 effaced, -2 station, presenting part vertex   Method of induction(proposed):  pitocin   Scheduling Provider Signature:  Jacklyn Shell, CNM                                            Today's Date:  03/11/2018

## 2018-03-11 NOTE — Progress Notes (Signed)
HIGH-RISK PREGNANCY VISIT Patient name: Jessica Carey Shrider MRN [id number]  Date of birth: 30-Nov-1985 Chief Complaint:   Routine Prenatal Visit  History of Present Illness:   Jessica Carey Feldstein is a 33 y.o. 651P0000 female at 3244w3d with an Estimated Date of Delivery: 03/29/18 being seen today for ongoing management of a high-risk pregnancy complicated by T2DM on metformin 1,000mg  BID, glyburide 10mg  hs, Lantus 26u AM; suspected LGA 92%.  Today she reports FBS <95 and  2 HR PP <120 except for a 243 (chinese food!). Contractions: Irregular.  .  Movement: Present. denies leaking of fluid.  Review of Systems:   Pertinent items are noted in HPI Denies abnormal vaginal discharge w/ itching/odor/irritation, headaches, visual changes, shortness of breath, chest pain, abdominal pain, severe nausea/vomiting, or problems with urination or bowel movements unless otherwise stated above.    Pertinent History Reviewed:  Medical & Surgical Hx:   Past Medical History:  Diagnosis Date  . Boil, breast 07/04/2013  . Diabetes (HCC) 08/04/2013  . Diabetes mellitus without complication (HCC)   . Irregular periods    Past Surgical History:  Procedure Laterality Date  . BREAST SURGERY Right    breast abcess  . SURGERY OF LIP     Family History  Problem Relation Age of Onset  . Diabetes Mother   . Diabetes Father   . Hyperlipidemia Father   . Diabetes Sister   . Cancer Maternal Aunt        liver  . Diabetes Paternal Grandmother     Current Outpatient Medications:  .  glyBURIDE (DIABETA) 5 MG tablet, Take 2 tablets (10 mg total) by mouth at bedtime., Disp: 60 tablet, Rfl: 3 .  insulin glargine (LANTUS) 100 UNIT/ML injection, Inject 0.26 mLs (26 Units total) into the skin daily. (Patient taking differently: Inject 26 Units into the skin every morning. ), Disp: 10 mL, Rfl: 11 .  metFORMIN (GLUCOPHAGE) 1000 MG tablet, Take 1 tablet (1,000 mg total) by mouth 2 (two) times daily with a meal., Disp: 60 tablet, Rfl: 6 .   Prenatal Vit-Fe Fumarate-FA (PNV PRENATAL PLUS MULTIVITAMIN) 27-1 MG TABS, Take 1 tablet by mouth daily., Disp: 30 tablet, Rfl: 11 .  NIFEdipine (PROCARDIA-XL/NIFEDICAL-XL) 30 MG 24 hr tablet, Take 1 tablet (30 mg total) by mouth daily. Can increase to twice a day as needed for symptomatic contractions (Patient not taking: Reported on 03/08/2018), Disp: 30 tablet, Rfl: 2 .  progesterone (PROMETRIUM) 200 MG capsule, Place 1 tablet in vagina every night (Patient not taking: Reported on 03/08/2018), Disp: 30 capsule, Rfl: 5 Social History: Reviewed -  reports that she has never smoked. She has never used smokeless tobacco.   Physical Assessment:   Vitals:   03/11/18 1103  BP: 120/76  Pulse: 94  Weight: 211 lb (95.7 kg)  Body mass index is 36.22 kg/m.           Physical Examination:   General appearance: alert, well appearing, and in no distress  Mental status: alert, oriented to person, place, and time  Skin: warm & dry   Extremities: Edema: Trace    Cardiovascular: normal heart rate noted  Respiratory: normal respiratory effort, no distress  Abdomen: gravid, soft, non-tender  Pelvic: Cervical exam performed  Dilation: 4 Effacement (%): 90 Station: -2  Fetal Status:     Movement: Present Presentation: Vertex  Fetal Surveillance Testing today: NST: FHR baseline 140 bpm, Variability: moderate, Accelerations:present, Decelerations:  Absent= Cat 1/Reactive   Results for orders placed or performed in visit  on 03/11/18 (from the past 24 hour(s))  POC Urinalysis Dipstick OB   Collection Time: 03/11/18 11:08 AM  Result Value Ref Range   Color, UA     Clarity, UA     Glucose, UA Negative Negative   Bilirubin, UA     Ketones, UA neg    Spec Grav, UA     Blood, UA neg    pH, UA     POC,PROTEIN,UA Small (1+) Negative, Trace, Small (1+), Moderate (2+), Large (3+), 4+   Urobilinogen, UA     Nitrite, UA neg    Leukocytes, UA Negative Negative   Appearance     Odor      Assessment & Plan:   1) High-risk pregnancy G1P0000 at [redacted]w[redacted]d with an Estimated Date of Delivery: 03/29/18   2) T2DM, stable.  Treatment Plan:  continue metformin 1,000mg  BID, glyburide 10mg  hs, Lantus 26u AM.  IOL scheduled for 2/17 at 0730, orders in  3) suspected LGA, EFW 92% at 36 weeks.       Follow-up: No follow-ups on file.  Future Appointments  Date Time Provider Department Center  03/15/2018 11:00 AM Lazaro Arms, MD [facility]  03/18/2018 11:00 AM Jacklyn Shell, CNM [facility]  03/22/2018  7:30 AM [facility] None    Orders Placed This Encounter  Procedures  . POC Urinalysis Dipstick OB   Jacklyn Shell CNM 03/11/2018 2:09 PM

## 2018-03-15 ENCOUNTER — Ambulatory Visit (INDEPENDENT_AMBULATORY_CARE_PROVIDER_SITE_OTHER): Payer: BLUE CROSS/BLUE SHIELD | Admitting: Obstetrics & Gynecology

## 2018-03-15 VITALS — BP 124/68 | HR 77 | Wt 212.6 lb

## 2018-03-15 DIAGNOSIS — O26873 Cervical shortening, third trimester: Secondary | ICD-10-CM

## 2018-03-15 DIAGNOSIS — Z1389 Encounter for screening for other disorder: Secondary | ICD-10-CM

## 2018-03-15 DIAGNOSIS — O24113 Pre-existing diabetes mellitus, type 2, in pregnancy, third trimester: Secondary | ICD-10-CM | POA: Diagnosis not present

## 2018-03-15 DIAGNOSIS — O26879 Cervical shortening, unspecified trimester: Secondary | ICD-10-CM

## 2018-03-15 DIAGNOSIS — O0993 Supervision of high risk pregnancy, unspecified, third trimester: Secondary | ICD-10-CM | POA: Diagnosis not present

## 2018-03-15 DIAGNOSIS — O24419 Gestational diabetes mellitus in pregnancy, unspecified control: Secondary | ICD-10-CM | POA: Diagnosis not present

## 2018-03-15 DIAGNOSIS — Z3A38 38 weeks gestation of pregnancy: Secondary | ICD-10-CM

## 2018-03-15 DIAGNOSIS — Z331 Pregnant state, incidental: Secondary | ICD-10-CM

## 2018-03-15 LAB — POCT URINALYSIS DIPSTICK OB
Blood, UA: NEGATIVE
Glucose, UA: NEGATIVE
Ketones, UA: NEGATIVE
Leukocytes, UA: NEGATIVE
Nitrite, UA: NEGATIVE

## 2018-03-15 NOTE — Progress Notes (Signed)
   HIGH-RISK PREGNANCY VISIT Patient name: Jessica Carey MRN [id number]  Date of birth: 10-12-1985 Chief Complaint:   High Risk Gestation (NST/ wants cervix check by female)  History of Present Illness:   Jessica Carey is a 33 y.o. G1P0000 female at [redacted]w[redacted]d with an Estimated Date of Delivery: 03/29/18 being seen today for ongoing management of a high-risk pregnancy complicated by class  A2 DM.  Today she reports no complaints. Contractions: Irregular.  .  Movement: Present. denies leaking of fluid.  Review of Systems:   Pertinent items are noted in HPI Denies abnormal vaginal discharge w/ itching/odor/irritation, headaches, visual changes, shortness of breath, chest pain, abdominal pain, severe nausea/vomiting, or problems with urination or bowel movements unless otherwise stated above. Pertinent History Reviewed:  Reviewed past medical,surgical, social, obstetrical and family history.  Reviewed problem list, medications and allergies. Physical Assessment:   Vitals:   03/15/18 1127  BP: 124/68  Pulse: 77  Weight: 212 lb 9.6 oz (96.4 kg)  Body mass index is 36.49 kg/m.           Physical Examination:   General appearance: alert, well appearing, and in no distress  Mental status: alert, oriented to person, place, and time  Skin: warm & dry   Extremities: Edema: Trace    Cardiovascular: normal heart rate noted  Respiratory: normal respiratory effort, no distress  Abdomen: gravid, soft, non-tender  Pelvic: Cervical exam performed       4.5/90/-2  Fetal Status:     Movement: Present    Fetal Surveillance Testing today: reactive NST   Results for orders placed or performed in visit on 03/15/18 (from the past 24 hour(s))  POC Urinalysis Dipstick OB   Collection Time: 03/15/18 11:30 AM  Result Value Ref Range   Color, UA     Clarity, UA     Glucose, UA Negative Negative   Bilirubin, UA     Ketones, UA neg    Spec Grav, UA     Blood, UA neg    pH, UA     POC,PROTEIN,UA Moderate  (2+) Negative, Trace, Small (1+), Moderate (2+), Large (3+), 4+   Urobilinogen, UA     Nitrite, UA neg    Leukocytes, UA Negative Negative   Appearance     Odor      Assessment & Plan:  1) High-risk pregnancy G1P0000 at [redacted]w[redacted]d with an Estimated Date of Delivery: 03/29/18   2) Class B DM , stable, RFW 39 weeks 4100 grams    Meds: No orders of the defined types were placed in this encounter.   Labs/procedures today: reactive NST  Treatment Plan:  NST in 3 days, IOL next week as scheduled  Reviewed: Term labor symptoms and general obstetric precautions including but not limited to vaginal bleeding, contractions, leaking of fluid and fetal movement were reviewed in detail with the patient.  All questions were answered.  Follow-up: Return in about 3 days (around 03/18/2018) for NST, HROB.  Orders Placed This Encounter  Procedures  . POC Urinalysis Dipstick OB   Lazaro Arms  03/15/2018 12:19 PM

## 2018-03-16 ENCOUNTER — Encounter (HOSPITAL_COMMUNITY): Payer: Self-pay | Admitting: *Deleted

## 2018-03-16 ENCOUNTER — Telehealth (HOSPITAL_COMMUNITY): Payer: Self-pay | Admitting: *Deleted

## 2018-03-16 NOTE — Telephone Encounter (Signed)
Preadmission screen  

## 2018-03-17 ENCOUNTER — Encounter (HOSPITAL_COMMUNITY): Payer: Self-pay

## 2018-03-17 ENCOUNTER — Inpatient Hospital Stay (HOSPITAL_COMMUNITY): Payer: BLUE CROSS/BLUE SHIELD | Admitting: Anesthesiology

## 2018-03-17 ENCOUNTER — Other Ambulatory Visit: Payer: Self-pay

## 2018-03-17 ENCOUNTER — Encounter (HOSPITAL_COMMUNITY)
Admission: AD | Disposition: A | Discharge status: Home / Self Care | Payer: Self-pay | Source: Home / Self Care | Attending: Internal Medicine

## 2018-03-17 ENCOUNTER — Inpatient Hospital Stay (HOSPITAL_COMMUNITY)
Admission: AD | Admit: 2018-03-17 | Discharge: 2018-03-23 | DRG: 768 | Disposition: A | Discharge status: Home / Self Care | Payer: BLUE CROSS/BLUE SHIELD | Attending: Internal Medicine | Admitting: Internal Medicine

## 2018-03-17 DIAGNOSIS — O139 Gestational [pregnancy-induced] hypertension without significant proteinuria, unspecified trimester: Secondary | ICD-10-CM | POA: Diagnosis present

## 2018-03-17 DIAGNOSIS — I619 Nontraumatic intracerebral hemorrhage, unspecified: Secondary | ICD-10-CM | POA: Diagnosis not present

## 2018-03-17 DIAGNOSIS — O135 Gestational [pregnancy-induced] hypertension without significant proteinuria, complicating the puerperium: Secondary | ICD-10-CM | POA: Diagnosis not present

## 2018-03-17 DIAGNOSIS — A419 Sepsis, unspecified organism: Secondary | ICD-10-CM | POA: Diagnosis not present

## 2018-03-17 DIAGNOSIS — O9943 Diseases of the circulatory system complicating the puerperium: Secondary | ICD-10-CM | POA: Diagnosis not present

## 2018-03-17 DIAGNOSIS — R002 Palpitations: Secondary | ICD-10-CM | POA: Diagnosis not present

## 2018-03-17 DIAGNOSIS — O0990 Supervision of high risk pregnancy, unspecified, unspecified trimester: Secondary | ICD-10-CM

## 2018-03-17 DIAGNOSIS — E119 Type 2 diabetes mellitus without complications: Secondary | ICD-10-CM | POA: Diagnosis present

## 2018-03-17 DIAGNOSIS — Z452 Encounter for adjustment and management of vascular access device: Secondary | ICD-10-CM | POA: Diagnosis not present

## 2018-03-17 DIAGNOSIS — O24415 Gestational diabetes mellitus in pregnancy, controlled by oral hypoglycemic drugs: Secondary | ICD-10-CM

## 2018-03-17 DIAGNOSIS — I4891 Unspecified atrial fibrillation: Secondary | ICD-10-CM | POA: Diagnosis present

## 2018-03-17 DIAGNOSIS — Z3483 Encounter for supervision of other normal pregnancy, third trimester: Secondary | ICD-10-CM | POA: Diagnosis present

## 2018-03-17 DIAGNOSIS — R7989 Other specified abnormal findings of blood chemistry: Secondary | ICD-10-CM | POA: Diagnosis not present

## 2018-03-17 DIAGNOSIS — O99214 Obesity complicating childbirth: Secondary | ICD-10-CM | POA: Diagnosis present

## 2018-03-17 DIAGNOSIS — N8189 Other female genital prolapse: Secondary | ICD-10-CM | POA: Diagnosis not present

## 2018-03-17 DIAGNOSIS — R0682 Tachypnea, not elsewhere classified: Secondary | ICD-10-CM | POA: Diagnosis not present

## 2018-03-17 DIAGNOSIS — O99284 Endocrine, nutritional and metabolic diseases complicating childbirth: Secondary | ICD-10-CM | POA: Diagnosis present

## 2018-03-17 DIAGNOSIS — I480 Paroxysmal atrial fibrillation: Secondary | ICD-10-CM | POA: Diagnosis not present

## 2018-03-17 DIAGNOSIS — Z23 Encounter for immunization: Secondary | ICD-10-CM | POA: Diagnosis not present

## 2018-03-17 DIAGNOSIS — Z794 Long term (current) use of insulin: Secondary | ICD-10-CM | POA: Diagnosis not present

## 2018-03-17 DIAGNOSIS — Z3A38 38 weeks gestation of pregnancy: Secondary | ICD-10-CM | POA: Diagnosis not present

## 2018-03-17 DIAGNOSIS — O368390 Maternal care for abnormalities of the fetal heart rate or rhythm, unspecified trimester, not applicable or unspecified: Secondary | ICD-10-CM

## 2018-03-17 DIAGNOSIS — K5900 Constipation, unspecified: Secondary | ICD-10-CM | POA: Diagnosis present

## 2018-03-17 DIAGNOSIS — O2412 Pre-existing diabetes mellitus, type 2, in childbirth: Principal | ICD-10-CM | POA: Diagnosis present

## 2018-03-17 DIAGNOSIS — I493 Ventricular premature depolarization: Secondary | ICD-10-CM | POA: Diagnosis present

## 2018-03-17 DIAGNOSIS — O26879 Cervical shortening, unspecified trimester: Secondary | ICD-10-CM

## 2018-03-17 DIAGNOSIS — I471 Supraventricular tachycardia: Secondary | ICD-10-CM | POA: Diagnosis not present

## 2018-03-17 DIAGNOSIS — R0603 Acute respiratory distress: Secondary | ICD-10-CM | POA: Diagnosis not present

## 2018-03-17 DIAGNOSIS — Q211 Atrial septal defect: Secondary | ICD-10-CM | POA: Diagnosis not present

## 2018-03-17 DIAGNOSIS — O411230 Chorioamnionitis, third trimester, not applicable or unspecified: Secondary | ICD-10-CM | POA: Diagnosis present

## 2018-03-17 DIAGNOSIS — O9089 Other complications of the puerperium, not elsewhere classified: Secondary | ICD-10-CM | POA: Diagnosis not present

## 2018-03-17 DIAGNOSIS — O26873 Cervical shortening, third trimester: Secondary | ICD-10-CM | POA: Diagnosis not present

## 2018-03-17 DIAGNOSIS — K006 Disturbances in tooth eruption: Secondary | ICD-10-CM | POA: Diagnosis not present

## 2018-03-17 DIAGNOSIS — O1494 Unspecified pre-eclampsia, complicating childbirth: Secondary | ICD-10-CM | POA: Diagnosis not present

## 2018-03-17 HISTORY — PX: PERINEAL LACERATION REPAIR: SHX5389

## 2018-03-17 LAB — CBC
HCT: 33.8 % — ABNORMAL LOW (ref 36.0–46.0)
Hemoglobin: 11.1 g/dL — ABNORMAL LOW (ref 12.0–15.0)
MCH: 28.6 pg (ref 26.0–34.0)
MCHC: 32.8 g/dL (ref 30.0–36.0)
MCV: 87.1 fL (ref 80.0–100.0)
Platelets: 129 10*3/uL — ABNORMAL LOW (ref 150–400)
RBC: 3.88 MIL/uL (ref 3.87–5.11)
RDW: 14 % (ref 11.5–15.5)
WBC: 11.9 10*3/uL — ABNORMAL HIGH (ref 4.0–10.5)
nRBC: 0 % (ref 0.0–0.2)

## 2018-03-17 LAB — COMPREHENSIVE METABOLIC PANEL
ALT: 13 U/L (ref 0–44)
ALT: 19 U/L (ref 0–44)
AST: 19 U/L (ref 15–41)
AST: 47 U/L — ABNORMAL HIGH (ref 15–41)
Albumin: 2.6 g/dL — ABNORMAL LOW (ref 3.5–5.0)
Albumin: 1.9 g/dL — ABNORMAL LOW (ref 3.5–5.0)
Alkaline Phosphatase: 144 U/L — ABNORMAL HIGH (ref 38–126)
Alkaline Phosphatase: 107 U/L (ref 38–126)
Anion gap: 6 (ref 5–15)
Anion gap: 11 (ref 5–15)
BUN: 12 mg/dL (ref 6–20)
BUN: 7 mg/dL (ref 6–20)
CO2: 19 mmol/L — ABNORMAL LOW (ref 22–32)
CO2: 18 mmol/L — ABNORMAL LOW (ref 22–32)
Calcium: 8.4 mg/dL — ABNORMAL LOW (ref 8.9–10.3)
Calcium: 8.3 mg/dL — ABNORMAL LOW (ref 8.9–10.3)
Chloride: 108 mmol/L (ref 98–111)
Chloride: 106 mmol/L (ref 98–111)
Creatinine, Ser: 0.65 mg/dL (ref 0.44–1.00)
Creatinine, Ser: 0.84 mg/dL (ref 0.44–1.00)
GFR calc Af Amer: >60 mL/min (ref >60)
GFR calc Af Amer: >60 mL/min (ref >60)
GFR calc non Af Amer: >60 mL/min (ref >60)
GFR calc non Af Amer: >60 mL/min (ref >60)
Glucose, Bld: 82 mg/dL (ref 70–99)
Glucose, Bld: 214 mg/dL — ABNORMAL HIGH (ref 70–99)
Potassium: 3.8 mmol/L (ref 3.5–5.1)
Potassium: 3.8 mmol/L (ref 3.5–5.1)
Sodium: 133 mmol/L — ABNORMAL LOW (ref 135–145)
Sodium: 135 mmol/L (ref 135–145)
Total Bilirubin: 0.3 mg/dL (ref 0.3–1.2)
Total Bilirubin: 0.3 mg/dL (ref 0.3–1.2)
Total Protein: 6.3 g/dL — ABNORMAL LOW (ref 6.5–8.1)
Total Protein: 5 g/dL — ABNORMAL LOW (ref 6.5–8.1)

## 2018-03-17 LAB — CBC WITH DIFFERENTIAL/PLATELET
Abs Immature Granulocytes: 0.17 10*3/uL — ABNORMAL HIGH (ref 0.00–0.07)
Basophils Absolute: 0 10*3/uL (ref 0.0–0.1)
Basophils Relative: 0 %
Eosinophils Absolute: 0 10*3/uL (ref 0.0–0.5)
Eosinophils Relative: 0 %
HCT: 31.6 % — ABNORMAL LOW (ref 36.0–46.0)
Hemoglobin: 10.5 g/dL — ABNORMAL LOW (ref 12.0–15.0)
Immature Granulocytes: 1 %
Lymphocytes Relative: 7 %
Lymphs Abs: 2 10*3/uL (ref 0.7–4.0)
MCH: 28 pg (ref 26.0–34.0)
MCHC: 33.2 g/dL (ref 30.0–36.0)
MCV: 84.3 fL (ref 80.0–100.0)
Monocytes Absolute: 0.8 10*3/uL (ref 0.1–1.0)
Monocytes Relative: 3 %
Neutro Abs: 24.8 10*3/uL — ABNORMAL HIGH (ref 1.7–7.7)
Neutrophils Relative %: 89 %
Platelets: 121 10*3/uL — ABNORMAL LOW (ref 150–400)
RBC: 3.75 MIL/uL — ABNORMAL LOW (ref 3.87–5.11)
RDW: 13.6 % (ref 11.5–15.5)
WBC: 27.9 10*3/uL — ABNORMAL HIGH (ref 4.0–10.5)
nRBC: 0 % (ref 0.0–0.2)

## 2018-03-17 LAB — APTT: aPTT: 28 seconds (ref 24–36)

## 2018-03-17 LAB — TYPE AND SCREEN
ABO/RH(D): O POS
ABO/RH(D): O POS
Antibody Screen: NEGATIVE
Antibody Screen: NEGATIVE

## 2018-03-17 LAB — MAGNESIUM: Magnesium: 1.7 mg/dL (ref 1.7–2.4)

## 2018-03-17 LAB — PROTIME-INR
INR: 1.02
Prothrombin Time: 13.3 seconds (ref 11.4–15.2)

## 2018-03-17 LAB — GLUCOSE, CAPILLARY
Glucose-Capillary: 86 mg/dL (ref 70–99)
Glucose-Capillary: 108 mg/dL — ABNORMAL HIGH (ref 70–99)
Glucose-Capillary: 171 mg/dL — ABNORMAL HIGH (ref 70–99)
Glucose-Capillary: 220 mg/dL — ABNORMAL HIGH (ref 70–99)

## 2018-03-17 LAB — TROPONIN I: Troponin I: 0.07 ng/mL (ref <0.03)

## 2018-03-17 LAB — PROTEIN / CREATININE RATIO, URINE
Creatinine, Urine: 112 mg/dL
Protein Creatinine Ratio: 0.82 mg/mg{Cre} — ABNORMAL HIGH (ref 0.00–0.15)
Total Protein, Urine: 92 mg/dL

## 2018-03-17 LAB — D-DIMER, QUANTITATIVE: D-Dimer, Quant: 15.97 ug/mL-FEU — ABNORMAL HIGH (ref 0.00–0.50)

## 2018-03-17 LAB — PHOSPHORUS: Phosphorus: 3.6 mg/dL (ref 2.5–4.6)

## 2018-03-17 LAB — TSH: TSH: 1.426 u[IU]/mL (ref 0.350–4.500)

## 2018-03-17 LAB — RPR: RPR Ser Ql: NONREACTIVE

## 2018-03-17 LAB — ABO/RH: ABO/RH(D): O POS

## 2018-03-17 SURGERY — SUTURE REPAIR, LACERATION, PERINEUM
Anesthesia: Spinal | Site: Vagina | Wound class: Clean Contaminated

## 2018-03-17 MED ORDER — DILTIAZEM LOAD VIA INFUSION: 10.0000 mg | Freq: Once | INTRAVENOUS | Status: DC

## 2018-03-17 MED ORDER — MISOPROSTOL 200 MCG PO TABS
800.0000 ug | ORAL_TABLET | Freq: Once | ORAL | Status: AC
  Administered 2018-03-17: 800 ug via BUCCAL

## 2018-03-17 MED ORDER — PIPERACILLIN-TAZOBACTAM 3.375 G IVPB
3.3750 g | Freq: Three times a day (TID) | INTRAVENOUS | Status: AC
  Administered 2018-03-18 – 2018-03-19 (×6): 3.375 g via INTRAVENOUS
  Filled 2018-03-17 (×11): qty 50

## 2018-03-17 MED ORDER — FENTANYL CITRATE (PF) 100 MCG/2ML IJ SOLN: 25.0000 ug | INTRAMUSCULAR | Status: DC | PRN

## 2018-03-17 MED ORDER — LIDOCAINE HCL (PF) 1 % IJ SOLN
INTRAMUSCULAR | Status: AC
  Filled 2018-03-17: qty 10

## 2018-03-17 MED ORDER — INSULIN GLARGINE 100 UNIT/ML ~~LOC~~ SOLN
15.0000 [IU] | Freq: Every day | SUBCUTANEOUS | Status: DC
  Administered 2018-03-17 – 2018-03-22 (×6): 15 [IU] via SUBCUTANEOUS
  Filled 2018-03-17 (×6): qty 0.15

## 2018-03-17 MED ORDER — DEXAMETHASONE SODIUM PHOSPHATE 4 MG/ML IJ SOLN
INTRAMUSCULAR | Status: AC
  Filled 2018-03-17: qty 1

## 2018-03-17 MED ORDER — MIDAZOLAM HCL 2 MG/2ML IJ SOLN
INTRAMUSCULAR | Status: AC
  Filled 2018-03-17: qty 2

## 2018-03-17 MED ORDER — OXYCODONE-ACETAMINOPHEN 5-325 MG PO TABS: 2.0000 | ORAL_TABLET | ORAL | Status: DC | PRN

## 2018-03-17 MED ORDER — DILTIAZEM LOAD VIA INFUSION
10.0000 mg | Freq: Once | INTRAVENOUS | Status: AC
  Administered 2018-03-17: 10 mg via INTRAVENOUS
  Filled 2018-03-17: qty 10

## 2018-03-17 MED ORDER — LACTATED RINGERS IV SOLN
INTRAVENOUS | Status: DC
  Administered 2018-03-17 (×6): via INTRAVENOUS

## 2018-03-17 MED ORDER — MIDAZOLAM HCL 2 MG/2ML IJ SOLN
INTRAMUSCULAR | Status: DC | PRN
  Administered 2018-03-17 (×2): 1 mg via INTRAVENOUS

## 2018-03-17 MED ORDER — BUPIVACAINE IN DEXTROSE 0.75-8.25 % IT SOLN
INTRATHECAL | Status: DC | PRN
  Administered 2018-03-17: 1.4 mL via INTRATHECAL

## 2018-03-17 MED ORDER — PROPOFOL 10 MG/ML IV BOLUS
INTRAVENOUS | Status: AC
  Filled 2018-03-17: qty 20

## 2018-03-17 MED ORDER — PHENYLEPHRINE 40 MCG/ML (10ML) SYRINGE FOR IV PUSH (FOR BLOOD PRESSURE SUPPORT)
PREFILLED_SYRINGE | INTRAVENOUS | Status: AC
  Filled 2018-03-17: qty 10

## 2018-03-17 MED ORDER — DILTIAZEM HCL-DEXTROSE 100-5 MG/100ML-% IV SOLN (PREMIX)
5.0000 mg/h | INTRAVENOUS | Status: DC
  Administered 2018-03-18 (×2): 5 mg/h via INTRAVENOUS
  Filled 2018-03-17 (×3): qty 100

## 2018-03-17 MED ORDER — PHENYLEPHRINE HCL 10 MG/ML IJ SOLN
INTRAMUSCULAR | Status: DC | PRN
  Administered 2018-03-17 (×3): 80 ug via INTRAVENOUS
  Administered 2018-03-17: 40 ug via INTRAVENOUS
  Administered 2018-03-17 (×4): 80 ug via INTRAVENOUS

## 2018-03-17 MED ORDER — PHENYLEPHRINE 8 MG IN D5W 100 ML (0.08MG/ML) PREMIX OPTIME
INJECTION | INTRAVENOUS | Status: DC | PRN
  Administered 2018-03-17: 30 ug/min via INTRAVENOUS

## 2018-03-17 MED ORDER — KETOROLAC TROMETHAMINE 30 MG/ML IJ SOLN
INTRAMUSCULAR | Status: AC
  Filled 2018-03-17: qty 1

## 2018-03-17 MED ORDER — DILTIAZEM HCL 25 MG/5ML IV SOLN
INTRAVENOUS | Status: DC
  Administered 2018-03-17: 15:00:00 via INTRAVENOUS
  Administered 2018-03-17: 10 mL/h via INTRAVENOUS
  Filled 2018-03-17 (×2): qty 100

## 2018-03-17 MED ORDER — TRANEXAMIC ACID-NACL 1000-0.7 MG/100ML-% IV SOLN
INTRAVENOUS | Status: AC
  Filled 2018-03-17: qty 100

## 2018-03-17 MED ORDER — DILTIAZEM HCL-DEXTROSE 100-5 MG/100ML-% IV SOLN (PREMIX)
5.0000 mg/h | INTRAVENOUS | Status: DC
  Filled 2018-03-17: qty 100

## 2018-03-17 MED ORDER — DILTIAZEM LOAD VIA INFUSION
10.0000 mg | Freq: Once | INTRAVENOUS | Status: DC
  Administered 2018-03-17: 10 mg via INTRAVENOUS

## 2018-03-17 MED ORDER — FENTANYL CITRATE (PF) 100 MCG/2ML IJ SOLN
100.0000 ug | INTRAMUSCULAR | Status: DC | PRN
  Administered 2018-03-17: 50 ug via INTRAVENOUS
  Administered 2018-03-17 – 2018-03-18 (×3): 100 ug via INTRAVENOUS
  Filled 2018-03-17 (×3): qty 2

## 2018-03-17 MED ORDER — DILTIAZEM HCL-DEXTROSE 100-5 MG/100ML-% IV SOLN (PREMIX): 5.0000 mg/h | INTRAVENOUS | Status: DC

## 2018-03-17 MED ORDER — SODIUM CHLORIDE 0.9 % IV SOLN
2.0000 g | Freq: Four times a day (QID) | INTRAVENOUS | Status: DC
  Administered 2018-03-17: 2 g via INTRAVENOUS
  Filled 2018-03-17: qty 2
  Filled 2018-03-17 (×2): qty 2000

## 2018-03-17 MED ORDER — DOCUSATE SODIUM 100 MG PO CAPS
100.0000 mg | ORAL_CAPSULE | Freq: Three times a day (TID) | ORAL | Status: DC
  Administered 2018-03-17 – 2018-03-23 (×17): 100 mg via ORAL
  Filled 2018-03-17 (×22): qty 1

## 2018-03-17 MED ORDER — DILTIAZEM LOAD VIA INFUSION
10.0000 mg | Freq: Once | INTRAVENOUS | Status: DC
  Filled 2018-03-17: qty 10

## 2018-03-17 MED ORDER — ONDANSETRON HCL 4 MG/2ML IJ SOLN: 4.0000 mg | Freq: Once | INTRAMUSCULAR | Status: DC | PRN

## 2018-03-17 MED ORDER — GENTAMICIN SULFATE 40 MG/ML IJ SOLN
160.0000 mg | Freq: Three times a day (TID) | INTRAVENOUS | Status: DC
  Filled 2018-03-17 (×2): qty 4

## 2018-03-17 MED ORDER — INSULIN ASPART 100 UNIT/ML ~~LOC~~ SOLN
0.0000 [IU] | Freq: Three times a day (TID) | SUBCUTANEOUS | Status: DC
  Administered 2018-03-18 (×2): 3 [IU] via SUBCUTANEOUS
  Administered 2018-03-18 – 2018-03-20 (×4): 2 [IU] via SUBCUTANEOUS

## 2018-03-17 MED ORDER — OXYTOCIN 40 UNITS IN NORMAL SALINE INFUSION - SIMPLE MED
2.5000 [IU]/h | INTRAVENOUS | Status: DC
  Filled 2018-03-17: qty 1000

## 2018-03-17 MED ORDER — SODIUM CHLORIDE 0.9% FLUSH
3.0000 mL | Freq: Two times a day (BID) | INTRAVENOUS | Status: DC
  Administered 2018-03-18 – 2018-03-21 (×8): 3 mL via INTRAVENOUS

## 2018-03-17 MED ORDER — FENTANYL CITRATE (PF) 100 MCG/2ML IJ SOLN
INTRAMUSCULAR | Status: AC
  Filled 2018-03-17: qty 2

## 2018-03-17 MED ORDER — LACTATED RINGERS IV SOLN: 500.0000 mL | INTRAVENOUS | Status: DC | PRN

## 2018-03-17 MED ORDER — IOPAMIDOL (ISOVUE-370) INJECTION 76%
INTRAVENOUS | Status: AC
  Administered 2018-03-18: 100 mL
  Filled 2018-03-17: qty 100

## 2018-03-17 MED ORDER — ONDANSETRON HCL 4 MG/2ML IJ SOLN: 4.0000 mg | Freq: Four times a day (QID) | INTRAMUSCULAR | Status: DC | PRN

## 2018-03-17 MED ORDER — PHENYLEPHRINE 40 MCG/ML (10ML) SYRINGE FOR IV PUSH (FOR BLOOD PRESSURE SUPPORT)
PREFILLED_SYRINGE | INTRAVENOUS | Status: AC
  Filled 2018-03-17: qty 20

## 2018-03-17 MED ORDER — CEFAZOLIN SODIUM-DEXTROSE 2-3 GM-%(50ML) IV SOLR
INTRAVENOUS | Status: DC | PRN
  Administered 2018-03-17: 2 g via INTRAVENOUS

## 2018-03-17 MED ORDER — OXYCODONE-ACETAMINOPHEN 5-325 MG PO TABS: 1.0000 | ORAL_TABLET | ORAL | Status: DC | PRN

## 2018-03-17 MED ORDER — ACETAMINOPHEN 325 MG PO TABS
650.0000 mg | ORAL_TABLET | ORAL | Status: DC | PRN
  Administered 2018-03-17: 650 mg via ORAL
  Filled 2018-03-17: qty 2

## 2018-03-17 MED ORDER — LIDOCAINE HCL (PF) 1 % IJ SOLN
30.0000 mL | INTRAMUSCULAR | Status: DC | PRN
  Filled 2018-03-17: qty 30

## 2018-03-17 MED ORDER — OXYTOCIN BOLUS FROM INFUSION
500.0000 mL | Freq: Once | INTRAVENOUS | Status: AC
  Administered 2018-03-17: 500 mL via INTRAVENOUS

## 2018-03-17 MED ORDER — MEPERIDINE HCL 25 MG/ML IJ SOLN
INTRAMUSCULAR | Status: DC | PRN
  Administered 2018-03-17: 12.5 mg via INTRAVENOUS

## 2018-03-17 MED ORDER — MISOPROSTOL 200 MCG PO TABS
ORAL_TABLET | ORAL | Status: AC
  Filled 2018-03-17: qty 5

## 2018-03-17 MED ORDER — INSULIN ASPART 100 UNIT/ML ~~LOC~~ SOLN
0.0000 [IU] | Freq: Every day | SUBCUTANEOUS | Status: DC
  Administered 2018-03-17: 2 [IU] via SUBCUTANEOUS

## 2018-03-17 MED ORDER — ONDANSETRON HCL 4 MG/2ML IJ SOLN
INTRAMUSCULAR | Status: AC
  Filled 2018-03-17: qty 2

## 2018-03-17 MED ORDER — PRENATAL PLUS 27-1 MG PO TABS
1.0000 | ORAL_TABLET | Freq: Every day | ORAL | Status: DC
  Administered 2018-03-18 – 2018-03-23 (×5): 1 via ORAL
  Filled 2018-03-17 (×6): qty 1

## 2018-03-17 MED ORDER — SOD CITRATE-CITRIC ACID 500-334 MG/5ML PO SOLN
30.0000 mL | ORAL | Status: DC | PRN
  Administered 2018-03-17: 30 mL via ORAL
  Filled 2018-03-17: qty 15

## 2018-03-17 MED ORDER — MEPERIDINE HCL 25 MG/ML IJ SOLN
INTRAMUSCULAR | Status: AC
  Filled 2018-03-17: qty 1

## 2018-03-17 SURGICAL SUPPLY — 15 items
GLOVE BIO SURGEON STRL SZ7.5 (GLOVE) ×2 IMPLANT
GLOVE BIOGEL PI IND STRL 7.0 (GLOVE) ×4 IMPLANT
GLOVE BIOGEL PI INDICATOR 7.0 (GLOVE) ×4
GLOVE ECLIPSE 7.0 STRL STRAW (GLOVE) ×1 IMPLANT
GOWN STRL REUS W/TWL LRG LVL3 (GOWN DISPOSABLE) ×6 IMPLANT
PACK VAGINAL MINOR WOMEN LF (CUSTOM PROCEDURE TRAY) ×1 IMPLANT
SPONGE LAP 18X18 RF (DISPOSABLE) ×4 IMPLANT
SUT VIC AB 0 CT1 36 (SUTURE) ×2 IMPLANT
SUT VIC AB 3-0 CT1 27 (SUTURE) ×4
SUT VIC AB 3-0 CT1 TAPERPNT 27 (SUTURE) ×2 IMPLANT
SUT VIC AB 4-0 SH 27 (SUTURE) ×4
SUT VIC AB 4-0 SH 27XANBCTRL (SUTURE) ×2 IMPLANT
TRAY FOLEY W/BAG SLVR 14FR LF (SET/KITS/TRAYS/PACK) ×1 IMPLANT
TUBING NON-CON 1/4 X 20 CONN (TUBING) ×2 IMPLANT
YANKAUER SUCT BULB TIP NO VENT (SUCTIONS) ×1 IMPLANT

## 2018-03-17 NOTE — H&P (Addendum)
LABOR AND DELIVERY ADMISSION HISTORY AND PHYSICAL NOTE  Jessica Carey is a 33 y.o. female G1P0000 with IUP at [redacted]w[redacted]d by 8 wk sono presenting for labor check in MAU, found to be in early labor. Noted to have elevated BPs in MAU and UPC of 0.82. Admitted for new onset Pre-E. Patient denies severe headaches, persistent vision changes, RUQ pain.  She reports positive fetal movement. She denies leakage of fluid or vaginal bleeding.  Prenatal History/Complications: PNC at [facility]  Pregnancy complications:  - T2DM (Metformin 1000 mg BID, Lantus 26u daily, Glyburide 10 mg qhs); EFW 92% at 36 weeks  -h/o Afib  -short cervix, was on progesterone vaginally during pregnancy    Past Medical History: Past Medical History:  Diagnosis Date  . Boil, breast 07/04/2013  . Diabetes (HCC) 08/04/2013  . Diabetes mellitus without complication (HCC)    glyburide and insulin  . Dysrhythmia    a-fib  . Irregular periods     Past Surgical History: Past Surgical History:  Procedure Laterality Date  . BREAST SURGERY Right    breast abcess  . SURGERY OF LIP      Obstetrical History: OB History    Gravida  1   Para  0   Term  0   Preterm  0   AB  0   Living  0     SAB  0   TAB  0   Ectopic  0   Multiple  0   Live Births  0           Social History: Social History   Socioeconomic History  . Marital status: Single    Spouse name: Not on file  . Number of children: Not on file  . Years of education: Not on file  . Highest education level: Not on file  Occupational History  . Not on file  Social Needs  . Financial resource strain: Not hard at all  . Food insecurity:    Worry: Never true    Inability: Never true  . Transportation needs:    Medical: No    Non-medical: Not on file  Tobacco Use  . Smoking status: Never Smoker  . Smokeless tobacco: Never Used  Substance and Sexual Activity  . Alcohol use: Not Currently    Comment: occ.  . Drug use: No  . Sexual activity: Yes   Partners: Male    Birth control/protection: None  Lifestyle  . Physical activity:    Days per week: Not on file    Minutes per session: Not on file  . Stress: Rather much  Relationships  . Social connections:    Talks on phone: Not on file    Gets together: Not on file    Attends religious service: Not on file    Active member of club or organization: Not on file    Attends meetings of clubs or organizations: Not on file    Relationship status: Not on file  Other Topics Concern  . Not on file  Social History Narrative   ** Merged History Encounter **        Family History: Family History  Problem Relation Age of Onset  . Diabetes Mother   . Diabetes Father   . Hyperlipidemia Father   . Diabetes Sister   . Cancer Maternal Aunt        liver  . Diabetes Paternal Grandmother     Allergies: No Known Allergies  Medications Prior to Admission  Medication  Sig Dispense Refill Last Dose  . glyBURIDE (DIABETA) 5 MG tablet Take 2 tablets (10 mg total) by mouth at bedtime. 60 tablet 3 03/16/2018 at Unknown time  . insulin glargine (LANTUS) 100 UNIT/ML injection Inject 0.26 mLs (26 Units total) into the skin daily. (Patient taking differently: Inject 26 Units into the skin every morning. ) 10 mL 11 03/16/2018 at Unknown time  . metFORMIN (GLUCOPHAGE) 1000 MG tablet Take 1 tablet (1,000 mg total) by mouth 2 (two) times daily with a meal. 60 tablet 6 03/16/2018 at Unknown time  . Prenatal Vit-Fe Fumarate-FA (PNV PRENATAL PLUS MULTIVITAMIN) 27-1 MG TABS Take 1 tablet by mouth daily. 30 tablet 11 03/16/2018 at Unknown time  . NIFEdipine (PROCARDIA-XL/NIFEDICAL-XL) 30 MG 24 hr tablet Take 1 tablet (30 mg total) by mouth daily. Can increase to twice a day as needed for symptomatic contractions 30 tablet 2 Not Taking  . progesterone (PROMETRIUM) 200 MG capsule Place 1 tablet in vagina every night (Patient not taking: Reported on 03/08/2018) 30 capsule 5 Not Taking     Review of Systems  All  systems reviewed and negative except as stated in HPI  Physical Exam Blood pressure (!) 140/101, pulse (!) 130, temperature 98.2 F (36.8 C), temperature source Oral, resp. rate (!) 22, weight 97.9 kg, last menstrual period 06/10/2017. General appearance: alert, oriented, NAD Lungs: normal respiratory effort Heart: regular rate Abdomen: soft, non-tender; gravid, FH appropriate for GA Extremities: No calf swelling or tenderness Presentation: cephalic Fetal monitoring: 135 bpm, moderate variability, +acels, no decels  Uterine activity: q1-2 min  Dilation: 4 Effacement (%): 90 Station: -2 Exam by:: TLYTLE RN   Prenatal labs: ABO, Rh: --/--/O POS (01/21 1646) Antibody: NEG (01/21 1646) Rubella: 15.70 (07/31 1536) RPR: Non Reactive (01/18 1145)  HBsAg: Negative (07/31 1536)  HIV: Non Reactive (12/04 1216)  GC/Chlamydia: Negative  GBS:  Negative  HgB A1c: 6.1 on 7/31  Genetic screening:  Normal  Anatomy US: Normal   Prenatal Transfer Tool  Maternal Diabetes: Yes:  Diabetes Type:  Pre-pregnancy Genetic Screening: Normal Maternal Ultrasounds/Referrals: Normal Fetal Ultrasounds or other Referrals:  Fetal echo, Referred to Materal Fetal Medicine  Maternal Substance Abuse:  No Significant Maternal Medications:  Meds include: Progesterone Other: Insulin, Metformin, Glyburide  Significant Maternal Lab Results: Lab values include: Group B Strep negative  Results for orders placed or performed during the hospital encounter of 03/17/18 (from the past 24 hour(s))  Protein / creatinine ratio, urine   Collection Time: 03/17/18  1:40 AM  Result Value Ref Range   Creatinine, Urine 112.00 mg/dL   Total Protein, Urine 92 mg/dL   Protein Creatinine Ratio 0.82 (H) 0.00 - 0.15 mg/mg[Cre]  CBC   Collection Time: 03/17/18  2:11 AM  Result Value Ref Range   WBC 11.9 (H) 4.0 - 10.5 K/uL   RBC 3.88 3.87 - 5.11 MIL/uL   Hemoglobin 11.1 (L) 12.0 - 15.0 g/dL   HCT 16.133.8 (L) 09.636.0 - 04.546.0 %   MCV  87.1 80.0 - 100.0 fL   MCH 28.6 26.0 - 34.0 pg   MCHC 32.8 30.0 - 36.0 g/dL   RDW 40.914.0 81.111.5 - 91.415.5 %   Platelets 129 (L) 150 - 400 K/uL   nRBC 0.0 0.0 - 0.2 %  Comprehensive metabolic panel   Collection Time: 03/17/18  2:11 AM  Result Value Ref Range   Sodium 133 (L) 135 - 145 mmol/L   Potassium 3.8 3.5 - 5.1 mmol/L   Chloride 108 98 -  111 mmol/L   CO2 19 (L) 22 - 32 mmol/L   Glucose, Bld 82 70 - 99 mg/dL   BUN 12 6 - 20 mg/dL   Creatinine, Ser 1.10 0.44 - 1.00 mg/dL   Calcium 8.4 (L) 8.9 - 10.3 mg/dL   Total Protein 6.3 (L) 6.5 - 8.1 g/dL   Albumin 2.6 (L) 3.5 - 5.0 g/dL   AST 19 15 - 41 U/L   ALT 13 0 - 44 U/L   Alkaline Phosphatase 144 (H) 38 - 126 U/L   Total Bilirubin 0.3 0.3 - 1.2 mg/dL   GFR calc non Af Amer >60 >60 mL/min   GFR calc Af Amer >60 >60 mL/min   Anion gap 6 5 - 15    Patient Active Problem List   Diagnosis Date Noted  . Abdominal trauma 02/23/2018  . Preterm uterine contractions, antepartum 02/20/2018  . Short cervix, antepartum 12/09/2017  . Atrial fibrillation (HCC) 09/23/2017  . Supervision of high risk pregnancy, antepartum 09/02/2017  . Type 2 diabetes mellitus during pregnancy 08/04/2013    Assessment: Jessica Carey is a 33 y.o. G1P0000 at [redacted]w[redacted]d here for early labor. Admitted for new onset Pre-E. Pregnancy complicated by T2DM.   #Labor: Expectant management. Patient making cervical change without augmentation.  #Pain: Declines epidural #FWB: Cat I  #ID:  GBS neg  #MOF: Breast  #MOC:Undecided  #Circ:  N/A  De Hollingshead 03/17/2018, 4:55 AM

## 2018-03-17 NOTE — MAU Note (Signed)
PT SAYS UC STRONG SINCE 2330.   VE - Monday- 4-5  CM.    DENIES HSV AND MRSA.    GBS- NEG

## 2018-03-17 NOTE — Progress Notes (Signed)
Pt noted to be tachypneic at RR over 22 & continues to c/o SOB.  Telemetry noted to show a period of VT with heart rates between 140-180s.  Awaiting EKG placement.  Apical auscultation notes irregular heart rate.  Pt continue to c/o SOB otherwise stable & able to push with UCs. O2 placed 10L/min via nonrebreather.  Pulse ox remained over 95%.  Unable to obtain accurate BP due to patient pushing Q41min & inability to rest between contractions.

## 2018-03-17 NOTE — Progress Notes (Signed)
Dr Malen Gauze reviewed 12-lead EKG & recommended cardiology consult.  Dr Ashok Pall made aware & cardiology consult called.  Orders rec'd for cardizem drip. Plans discussed for transfer to [facility] post OR.

## 2018-03-17 NOTE — MAU Provider Note (Signed)
Chief Complaint:  Contractions   First Provider Initiated Contact with Patient 03/17/18 [phone]     HPI: Jessica Carey is a 33 y.o. G1P0000 at 41w2dwho presents to maternity admissions reporting painful uterine contractions this evening which have now lessened.  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  States had some blurred vision when she stood up tonight. Denies headache.  Has not had any prior hypertension. She reports good fetal movement, denies LOF, vaginal bleeding, vaginal itching/burning, urinary symptoms, h/a, dizziness, n/v, diarrhea, constipation or fever/chills.  She denies headache, visual changes or RUQ abdominal pain.   Past Medical History: Past Medical History:  Diagnosis Date  . Boil, breast 07/04/2013  . Diabetes (HCC) 08/04/2013  . Diabetes mellitus without complication (HCC)    glyburide and insulin  . Dysrhythmia    a-fib  . Irregular periods     Past obstetric history: OB History  Gravida Para Term Preterm AB Living  1 0 0 0 0 0  SAB TAB Ectopic Multiple Live Births  0 0 0 0 0    # Outcome Date GA Lbr Len/2nd Weight Sex Delivery Anes PTL Lv  1 Current             Past Surgical History: Past Surgical History:  Procedure Laterality Date  . BREAST SURGERY Right    breast abcess  . SURGERY OF LIP      Family History: Family History  Problem Relation Age of Onset  . Diabetes Mother   . Diabetes Father   . Hyperlipidemia Father   . Diabetes Sister   . Cancer Maternal Aunt        liver  . Diabetes Paternal Grandmother     Social History: Social History   Tobacco Use  . Smoking status: Never Smoker  . Smokeless tobacco: Never Used  Substance Use Topics  . Alcohol use: Not Currently    Comment: occ.  . Drug use: No    Allergies: No Known Allergies  Meds:  Medications Prior to Admission  Medication Sig Dispense Refill Last Dose  . glyBURIDE (DIABETA) 5 MG tablet Take 2 tablets (10 mg  total) by mouth at bedtime. 60 tablet 3 03/16/2018 at Unknown time  . insulin glargine (LANTUS) 100 UNIT/ML injection Inject 0.26 mLs (26 Units total) into the skin daily. (Patient taking differently: Inject 26 Units into the skin every morning. ) 10 mL 11 03/16/2018 at Unknown time  . metFORMIN (GLUCOPHAGE) 1000 MG tablet Take 1 tablet (1,000 mg total) by mouth 2 (two) times daily with a meal. 60 tablet 6 03/16/2018 at Unknown time  . Prenatal Vit-Fe Fumarate-FA (PNV PRENATAL PLUS MULTIVITAMIN) 27-1 MG TABS Take 1 tablet by mouth daily. 30 tablet 11 03/16/2018 at Unknown time  . NIFEdipine (PROCARDIA-XL/NIFEDICAL-XL) 30 MG 24 hr tablet Take 1 tablet (30 mg total) by mouth daily. Can increase to twice a day as needed for symptomatic contractions 30 tablet 2 Not Taking  . progesterone (PROMETRIUM) 200 MG capsule Place 1 tablet in vagina every night (Patient not taking: Reported on 03/08/2018) 30 capsule 5 Not Taking    I have reviewed patient's Past Medical Hx, Surgical Hx, Family Hx, Social Hx, medications and allergies.   ROS:  Review of Systems  Constitutional: Negative for chills and fever.  Eyes: Positive for visual disturbance.  Respiratory: Negative for shortness of breath.   Cardiovascular: Negative for leg swelling.  Gastrointestinal: Positive for abdominal  pain. Negative for constipation, diarrhea and nausea.  Genitourinary: Negative for dysuria and vaginal bleeding.  Neurological: Negative for weakness and headaches.   Other systems negative  Physical Exam   Patient Vitals for the past 24 hrs:  BP Temp Temp src Pulse Resp Weight  03/17/18 0205 136/75 - - 86 - -  03/17/18 0137 (!) 151/86 - - (!) 49 - -  03/17/18 0121 (!) 140/101 98.2 F (36.8 C) Oral (!) 135 (!) 22 97.9 kg   Vitals:   03/17/18 0301 03/17/18 0303 03/17/18 0322 03/17/18 0343  BP: 137/85 137/85 136/73 (!) 131/98  Pulse: 64 64 84 (!) 126  Resp:      Temp:      TempSrc:      Weight:       Vitals:   03/17/18  0401 03/17/18 0416 03/17/18 0431 03/17/18 0446  BP: (!) 133/100 138/82 (!) 137/92 (!) 140/101  Pulse: 75 76 84 (!) 130  Resp:      Temp:      TempSrc:      Weight:        Constitutional: Well-developed, well-nourished female in no acute distress.  Cardiovascular: normal rate and rhythm Respiratory: normal effort, clear to auscultation bilaterally GI: Abd soft, non-tender, gravid appropriate for gestational age.   No rebound or guarding. MS: Extremities nontender, Trace pedal edema, normal ROM Neurologic: Alert and oriented x 4.   DTRs 2+ with no clonus, GU: Neg CVAT.  PELVIC EXAM:  Dilation: 4.5 Effacement (%): 90 Cervical Position: Anterior Station: -2 Presentation: Vertex Exam by:: TLYTLE RN   FHT:  Baseline 140 , moderate variability, accelerations present, no decelerations Contractions: q 4-7 mins Irregular     Labs: Results for orders placed or performed during the hospital encounter of 03/17/18 (from the past 24 hour(s))  Protein / creatinine ratio, urine     Status: Abnormal   Collection Time: 03/17/18  1:40 AM  Result Value Ref Range   Creatinine, Urine 112.00 mg/dL   Total Protein, Urine 92 mg/dL   Protein Creatinine Ratio 0.82 (H) 0.00 - 0.15 mg/mg[Cre]  CBC     Status: Abnormal   Collection Time: 03/17/18  2:11 AM  Result Value Ref Range   WBC 11.9 (H) 4.0 - 10.5 K/uL   RBC 3.88 3.87 - 5.11 MIL/uL   Hemoglobin 11.1 (L) 12.0 - 15.0 g/dL   HCT 16.133.8 (L) 09.636.0 - 04.546.0 %   MCV 87.1 80.0 - 100.0 fL   MCH 28.6 26.0 - 34.0 pg   MCHC 32.8 30.0 - 36.0 g/dL   RDW 40.914.0 81.111.5 - 91.415.5 %   Platelets 129 (L) 150 - 400 K/uL   nRBC 0.0 0.0 - 0.2 %  Comprehensive metabolic panel     Status: Abnormal   Collection Time: 03/17/18  2:11 AM  Result Value Ref Range   Sodium 133 (L) 135 - 145 mmol/L   Potassium 3.8 3.5 - 5.1 mmol/L   Chloride 108 98 - 111 mmol/L   CO2 19 (L) 22 - 32 mmol/L   Glucose, Bld 82 70 - 99 mg/dL   BUN 12 6 - 20 mg/dL   Creatinine, Ser 7.820.65 0.44 - 1.00  mg/dL   Calcium 8.4 (L) 8.9 - 10.3 mg/dL   Total Protein 6.3 (L) 6.5 - 8.1 g/dL   Albumin 2.6 (L) 3.5 - 5.0 g/dL   AST 19 15 - 41 U/L   ALT 13 0 - 44 U/L   Alkaline Phosphatase 144 (H) 38 -  126 U/L   Total Bilirubin 0.3 0.3 - 1.2 mg/dL   GFR calc non Af Amer >60 >60 mL/min   GFR calc Af Amer >60 >60 mL/min   Anion gap 6 5 - 15    --/--/O POS (01/21 1646)  Imaging:    MAU Course/MDM: I have ordered labs and reviewed results. Platelets are decreased and Protein/Creat Ratio is elevated.  NST reviewed and is reassuring Consult Dr Adrian Blackwater with presentation, exam findings and test results.    Assessment: Single intrauterine pregnancy at [redacted]w[redacted]d Prodromal labor New onset gestational hypertension Thrombocytopenia Elevated Protein/Creatinine Ratio  Plan: Admit to [employer] Routine orders Induction of labor  Labor team to follow   Wynelle Bourgeois CNM, MSN Certified Nurse-Midwife 03/17/2018 2:23 AM

## 2018-03-17 NOTE — Progress Notes (Signed)
Patient is a 33 year old female with past medical history of diabetes, paroxysmal A. fib was initially [facility] for [facility].  She was also found to have new onset preeclampsia.  She is on metformin, Lantus and glipizide at home for her diabetes.  She has history of paroxysmal A. fib.  She was following up with cardiology.  She was not on any beta-blockers or blood thinners. She has delivered a baby at [facility] today.  Delivery course was complicated by atrial flutter.  She was tachycardic in the range of 150s during the delivery.  EKG showed wide complex tachycardia.  Cardiology was called by GYN .cardiology thought that it is not wide-complex tachycardia but it is atrial flutter.  Cardiology recommended to continue Cardizem drip.  She also sustained fourth degree perineal laceration during the delivery and is being treated for that. We were requested to admit the patient to [facility] for atrial flutter for Cardizem drip.  Patient will be admitted to progressive unit.  Will discuss with cardiology when she comes here.  When she arrives here, will continue Cardizem drip and order echocardiogram.  Most likely this is a transient episode, she was not on anticoagulation so she may not  need anticoagulation.CHA2DS2VASc socre of 2.

## 2018-03-17 NOTE — Anesthesia Procedure Notes (Signed)
Spinal  Patient location during procedure: OR Start time: 03/17/2018 2:50 PM End time: 03/17/2018 2:54 PM Staffing Anesthesiologist: Mal Amabile, MD Performed: anesthesiologist  Preanesthetic Checklist Completed: patient identified, site marked, surgical consent, pre-op evaluation, timeout performed, IV checked, risks and benefits discussed and monitors and equipment checked Spinal Block Patient position: sitting Prep: site prepped and draped and DuraPrep Patient monitoring: heart rate, cardiac monitor, continuous pulse ox and blood pressure Approach: midline Location: L3-4 Injection technique: single-shot Needle Needle type: Pencan  Needle gauge: 24 G Needle length: 9 cm Needle insertion depth: 6 cm Assessment Sensory level: T6 Additional Notes Patient tolerated procedure well. Adequate sensory level.

## 2018-03-17 NOTE — Anesthesia Postprocedure Evaluation (Signed)
Anesthesia Post Note  Patient: Jessica Carey  Procedure(s) Performed: SUTURE REPAIR PERINEAL LACERATION (N/A Vagina )     Patient location during evaluation: PACU Anesthesia Type: Spinal Level of consciousness: awake and alert Pain management: pain level controlled Vital Signs Assessment: post-procedure vital signs reviewed and stable Respiratory status: spontaneous breathing and respiratory function stable Cardiovascular status: blood pressure returned to baseline and stable Postop Assessment: spinal receding Anesthetic complications: no    Last Vitals:  Vitals:   03/17/18 1747 03/17/18 1750  BP: 128/82 127/76  Pulse: (!) 109 (!) 102  Resp: (!) 28 (!) 27  Temp:    SpO2: 95% 96%    Last Pain:  Vitals:   03/17/18 1745  TempSrc:   PainSc: 0-No pain                 Kennieth Rad

## 2018-03-17 NOTE — Transfer of Care (Signed)
Immediate Anesthesia Transfer of Care Note  Patient: Jessica Carey  Procedure(s) Performed: SUTURE REPAIR PERINEAL LACERATION (N/A Vagina )  Patient Location: PACU with care link to transport   Anesthesia Type:Regional  Level of Consciousness: awake  Airway & Oxygen Therapy: Patient Spontanous Breathing  Post-op Assessment: Report given to RN  Post vital signs: Reviewed and stable  Last Vitals:  Vitals Value Taken Time  BP    Temp    Pulse    Resp    SpO2      Last Pain:  Vitals:   03/17/18 1341  TempSrc: Oral  PainSc:          Complications: No apparent anesthesia complications, Report to Caswell Corwin RN

## 2018-03-17 NOTE — H&P (Signed)
History and Physical   Jessica Carey EBR:[id number] DOB: [birth year] DOA: 03/17/2018  PCP: Patient, No Pcp Per  Chief Complaint: palpitations  History is obtained via chart review, patient self-reported history.  HPI: This is a 33 year old woman with medical problems including obesity, paroxysmal atrial fibrillation, diabetes on insulin, preeclampsia, and vaginal delivery on March 17, 2018 complicated by fourth degree perineal laceration who developed tachyarrhythmia warranting transfer to [facility] for further management.  Patient reports she developed palpitations early in her pregnancy and was seen by Dr. Wyline Mood of the cardiology team who recommended labetalol, no pursuit of anticoagulation given risk stratification for stroke with CHA2DS2-VASc score of 2.  However, the patient reports she did not take labetalol due to self perceived side effects.  Patient reports other than pregnancy related symptoms, she has been having some palpitations more so during delivery as well as lightheadedness.  She reports her current pain level is 0, but worsens when moving.  Currently denies chest pain, shortness of breath at rest.  She was febrile to 100.6 F, was initiated on piperacillin-tazobactam by the [facility] team due to concerns for chorioamnionitis.  She reports she plans to breast-feed.  This is her first child.  She reports she snores at night, but no diagnosis of sleep apnea.  Reports taking basal insulin along with a sulfonylurea for management of her blood sugars during pregnancy.  Per Dr. Dionisio David note, cardiology team reviewed EKG with GYN team and recommended diltiazem drip for atrial flutter, and transferred to [facility] for further management.  Review of Systems: A complete ROS was obtained; pertinent positives negatives are denoted in the HPI. Otherwise, all systems are negative.   Past Medical History:  Diagnosis Date  . Boil, breast 07/04/2013  . Diabetes (HCC) 08/04/2013  . Diabetes  mellitus without complication (HCC)    glyburide and insulin  . Dysrhythmia    a-fib  . Irregular periods    Social History   Socioeconomic History  . Marital status: Single    Spouse name: Not on file  . Number of children: Not on file  . Years of education: Not on file  . Highest education level: Not on file  Occupational History  . Not on file  Social Needs  . Financial resource strain: Not hard at all  . Food insecurity:    Worry: Never true    Inability: Never true  . Transportation needs:    Medical: No    Non-medical: Not on file  Tobacco Use  . Smoking status: Never Smoker  . Smokeless tobacco: Never Used  Substance and Sexual Activity  . Alcohol use: Not Currently    Comment: occ.  . Drug use: No  . Sexual activity: Yes    Partners: Male    Birth control/protection: None  Lifestyle  . Physical activity:    Days per week: Not on file    Minutes per session: Not on file  . Stress: Rather much  Relationships  . Social connections:    Talks on phone: Not on file    Gets together: Not on file    Attends religious service: Not on file    Active member of club or organization: Not on file    Attends meetings of clubs or organizations: Not on file    Relationship status: Not on file  . Intimate partner violence:    Fear of current or ex partner: No    Emotionally abused: No    Physically abused: No  Forced sexual activity: No  Other Topics Concern  . Not on file  Social History Narrative   ** Merged History Encounter **       Family History  Problem Relation Age of Onset  . Diabetes Mother   . Diabetes Father   . Hyperlipidemia Father   . Diabetes Sister   . Cancer Maternal Aunt        liver  . Diabetes Paternal Grandmother     Physical Exam: Vitals:   03/17/18 2001 03/17/18 2020 03/17/18 2024 03/17/18 2100  BP: 124/82   115/68  Pulse:   (!) 102 97  Resp:    (!) 21  Temp:  99.7 F (37.6 C)    TempSrc:  Oral    SpO2:      Weight:        Height:       General: Appears calm and comfortable, pleasant obese Hispanic woman ENT: Grossly normal hearing, MMM. Cardiovascular: Tachycardic with intermittently irregular rhythm, no rubs or murmurs.  No lower extremity edema. Respiratory: CTA bilaterally. No wheezes or crackles. Normal respiratory effort.  Breathing room air Abdomen: Soft, non-tender. GYN: Detailed exam deferred, has no active bleeding from the vaginal canal upon external inspection Skin: No rash or induration seen on limited exam, linea nigra present Musculoskeletal: Grossly normal tone BUE/BLE. Appropriate ROM.  Psychiatric: Grossly normal mood and affect. Neurologic: Moves all extremities in coordinated fashion.  I have personally reviewed the following labs, culture data, and imaging studies.  Labs:  Labs upon admission to [facility] remarkable for increasing white count of 20,000, with neutrophil predominance and immature granulocytes seen.  Hemoglobin 10.5, platelets 121.  CMP remarkable for creatinine 0.84, albumin low 1.9.  Other labs with troponin 0 0.07, d-dimer elevated to 16.  Radiology:  CT PE study with mild cardiomegaly, groundglass opacities within the lungs could reflect atelectasis or edema, no evidence of pulmonary embolus.  Assessment/Plan:  #Atrial fibrillation / atrial flutter Course: diagnosed with paroxysmal AF earlier in pregnancy, established care with cardiology in the OP setting where labetalol was recommended, AC not initiated as believed burdens / risks outweighed benefits.  Had symptomatic tachyarrhythmia in the peri-partum setting, with EKG revealing HR of ~150 bpm; cardiology relaying belief that c/w AFL,was started on diltiazem gtt and transferred to [facility] for further management.  CT PE study obtained to rule out PE - negative. A/P: HR controlled on diltiazem gtt - will continue; obtain TTE, consult cardiology in AM for further recommendations on optimal transition to PO agents.   Currently hemodynamically stable. I discussed possibility of AC with the patient and that in general AF associated stroke risk is estimated over months / year and given recent perineal laceration repair - short term burdens outweigh benefits.  Possible underlying OSA as contributor to prior AF.  Pursuing rate control strategy at this time.  #Other problems: -Sepsis due to chorioamnionitis, suspected : WBC of 28,000 on admission; I have ordered empiric piperacillin-tazobactam to treat, monitor clinical status / response -S/P 4th degree laceration : OB team will continue to follow, appreciate their help in optimizing wound management, currently pain controlled -Hypo-albuminemia: may be a result of pregnancy related disease, UA ordered to evaluate, may need to evaluate for evidence of nephrotic syndrome -Insulin dependent DM: was initiated on insulin during pregnancy, will continue basal insulin at reduced dose of 15 units with rapid acting insulin as correction factor - adjust accordingly -Hypomagnesemia: Mg of 1.7, will replace with IV replacement and check  in AM -Troponin elevation: suspect demand ischemia in setting of tachycardia, will trend to peak   DVT prophylaxis: SCDs Code Status: full Disposition Plan: Anticipate D/C home in 2-5 days Consults called: OB continues to follow, consult cardiology team in AM Admission status: admit to telemetry /step-down level of care   Laurell RoofPatrick Diarra Ceja, MD [facility] Page:[phone]  If 7PM-7AM, please contact night-coverage [email or web address] Password [email or web address]  This document was created using the aid of voice recognition / dication software.

## 2018-03-17 NOTE — Progress Notes (Addendum)
Called to patient room for tachycardia. Complete and pushing, crowning. bp wnl, alert, not hypoxic. Decision made to proceed with delivery.  Delay in time from crown to neck, with turtling. cnm took over for resident. mcroberts with suprapubic pressure attempted, not effective. I stepped in, difficult reaching posterior shoulder. Patient turned to hands and knees. With some difficulty Iwas then able to grasp the posterior shoulder, grab arm, and extract that arm. With downward traction fetus did not deliver. Dr. Macon Large arrived and was able to locate anterior hand and deliver the posterior shoulder, and then the infant. Cord was avulsed in transferring infant to nicu team. Proximal end of cord located and clamped. Ph sent. cytotec 800 mcg buccal administered for ppx. Placenta delivered in standard fashion.  ECG performed, showing wide complex regular rhythm. Cardiology consulted, said this does not actually look like a wide complex rhythm but instead a flutter (pt has hx know paroxysmal a-fib, normal recent tte). Advised diltiazem drip, loading dose of 10, then constant infusion of 5. That ordered.  4th degree laceration extends very posterior, will require OR repair for adequate anesthesia and visualization. Patient was consented for procedure and made aware of risk including bleeding, infection, damage to surrounding tissues, and complications including future risk of incontinence, perineal pain.   At closure of case spoke w/ Dr. Renford Dills, hospitalist at [facility], for admission. Plan to admit to step-down there for mgmt of a-flutter with rvr requiring IV rate control. For triple I and 4th degree laceration antibiotic plan will be intravenous zosyn. Will also need bowel regimen of colace tid.   I have updated family with plan of care.

## 2018-03-17 NOTE — Anesthesia Pain Management Evaluation Note (Signed)
  CRNA Pain Management Visit Note  Patient: Jessica LeachRosa Carey, 33 y.o., female  "Hello I am a member of the anesthesia team at [facility]. We have an anesthesia team available at all times to provide care throughout the hospital, including epidural management and anesthesia for C-section. I don't know your plan for the delivery whether it a natural birth, water birth, IV sedation, nitrous supplementation, doula or epidural, but we want to meet your pain goals."   1.Was your pain managed to your expectations on prior hospitalizations?   No prior hospitalizations  2.What is your expectation for pain management during this hospitalization?     Labor support without medications  3.How can we help you reach that goal? UNSURE  Record the patient's initial score and the patient's pain goal.   Pain: 8  Pain Goal: 9 [facility] wants you to be able to say your pain was always managed very well.  Cephus ShellingBURGER,Jessica Carey 03/17/2018

## 2018-03-17 NOTE — Anesthesia Preprocedure Evaluation (Addendum)
Anesthesia Evaluation  Patient identified by MRN, date of birth, ID band Patient awake    Reviewed: Allergy & Precautions, NPO status , Patient's Chart, lab work & pertinent test results  Airway Mallampati: III  TM Distance: >3 FB Neck ROM: Full    Dental no notable dental hx. (+) Teeth Intact   Pulmonary neg pulmonary ROS,    Pulmonary exam normal breath sounds clear to auscultation       Cardiovascular hypertension, Pt. on medications Normal cardiovascular exam+ dysrhythmias Atrial Fibrillation  Rhythm:Regular Rate:Normal  Currently in Atrial flutter with 2:1 block HR 130's-140's   Neuro/Psych negative neurological ROS  negative psych ROS   GI/Hepatic negative GI ROS, Neg liver ROS,   Endo/Other  diabetes, Well Controlled, Gestational, Oral Hypoglycemic Agents, Insulin DependentObesity  Renal/GU negative Renal ROS  negative genitourinary   Musculoskeletal negative musculoskeletal ROS (+)   Abdominal (+) + obese,   Peds  Hematology  (+) anemia , Thrombocytopenia-mild   Anesthesia Other Findings   Reproductive/Obstetrics 4th degree Vaginal laceration S/P vaginal delivery complicated by shoulder dystocia                            Anesthesia Physical Anesthesia Plan  ASA: III and emergent  Anesthesia Plan: Spinal   Post-op Pain Management:    Induction:   PONV Risk Score and Plan:   Airway Management Planned: Natural Airway, Nasal Cannula and Simple Face Mask  Additional Equipment:   Intra-op Plan:   Post-operative Plan:   Informed Consent: I have reviewed the patients History and Physical, chart, labs and discussed the procedure including the risks, benefits and alternatives for the proposed anesthesia with the patient or authorized representative who has indicated his/her understanding and acceptance.     Dental advisory given  Plan Discussed with: CRNA,  Anesthesiologist and Surgeon  Anesthesia Plan Comments:         Anesthesia Quick Evaluation

## 2018-03-17 NOTE — Progress Notes (Signed)
In recovery 1720 Out of recovery 1835 Complete 1900

## 2018-03-17 NOTE — Progress Notes (Signed)
Pt transferred to OR via labor bed after being consented by Dr Ashok PallWouk.

## 2018-03-17 NOTE — Progress Notes (Signed)
Transferred to 6E room 29, [facility]

## 2018-03-17 NOTE — Progress Notes (Addendum)
   03/17/18 1336  Respiratory  Respiratory (WDL) X  Respiratory Pattern Other (Comment);Tachypnea;Labored (SOB)  Cardiac  Cardiac (WDL) X  Pulse Irregular  Difficulty palpating maternal heart rate via radial pulse.  Pt c/o SOB. Attempted apical auscultation, noted to be irregular & tachycardic.  Portable telemetry obtained.

## 2018-03-17 NOTE — Op Note (Signed)
Harley Vrba PROCEDURE DATE: 03/17/2018  PREOPERATIVE DIAGNOSES:  Extensive fourth degree perineal laceration POSTOPERATIVE DIAGNOSES: The same PROCEDURE: Repair of perineal laceration SURGEON:  Dr. Jaynie Collins ASSISTANT: Dr. Shonna Chock ANESTHESIOLOGIST: Dr. Mal Amabile  INDICATIONS: 33 y.o. G1P1 s/p vaginal delivery complicated by shoulder dystocia and subsequent fourth degree laceration. Unable to fully visualize extent of laceration in delivery room, and patient did not have any anesthesia on board.  The decision was made to proceed with repair in the OR.  Of note, patient also was being managed for atrial fibrillation concurrently, please refer to Dr. Lelon Mast and Dr. Mercy Riding notes for more details about this. Risks of surgery were discussed with the patient including but not limited to: bleeding; infection which may require antibiotics; injury to surrounding organs; need for additional procedures; flatal or fecal incontinence issues; fistula development and other postoperative/anesthesia complications. Written informed consent was obtained.    FINDINGS:  Extensive fourth degree laceration, measuring about 10 cm deep from introitus and involving all layers down all the way through rectal mucosa.   ANESTHESIA:    General ESTIMATED BLOOD LOSS: 200 ml COMPLICATIONS: None immediate  PROCEDURE IN DETAIL:  The patient received intravenous antibiotics and had sequential compression devices applied to her lower extremities while in the preoperative area.  She was then taken to the operating room where spinal anesthesia was administered and was found to be adequate.  She was placed in the dorsal lithotomy position, and was prepped and draped in a sterile manner.  A Foley catheter was inserted into her bladder and attached to constant drainage. After an adequate timeout was performed, attention was turned to the pelvis and retractors were placed in the vagina to help with exposure.  The rectal mucosa  was repaired using a nonlocking 4-0 Vicryl stitch with a SH needle. The internal anal sphincter was identified and also reapproximated with a similar suture and stitch as above. The external anal sphincter (EAS) was identified; the two ends of the severed EAS muscle were grasped with Allis clamps, and then four end-to-end 0 Vicryl figure-of-eight sutures were placed. The lacerated vaginal tissue was then reapproximated with a 3-0 Vicryl running locked suture; this suture was also used to reapproximate the rest of the perineal body in a running stittch and to do a subcuticular closure of the skin of the perineum.  All instruments were removed from the patient's vagina.  Sponge and instrument counts were correct times two.  The patient tolerated the procedure well.  Given her ongoing cardiac issues, patient will be transferred to [facility] from the OR given need for Cardizem drip and cardiac telemetry.  For her repaired fourth degree laceration, she will need ice packs to perineum and bowel regimen with Colace 100 mg tid. No other special treatment needed during her inpatient stay; we will round on her daily and examine her perineum. She will get other routine postpartum care.    Jaynie Collins, MD, FACOG Obstetrician & Gynecologist, [facility] for [employer], [facility]     .

## 2018-03-17 NOTE — Progress Notes (Signed)
ANTIBIOTIC CONSULT NOTE - INITIAL  Pharmacy Consult for Gentamicin Indication: Chorioamnionitis  No Known Allergies  Patient Measurements: Height: 5\' 4"  (162.6 cm) Weight: 215 lb 9.8 oz (97.8 kg) IBW/kg (Calculated) : 54.7 kg Adjusted Body Weight: 68 kg  Vital Signs: Temp: 100.6 F (38.1 C) (02/12 1341) Temp Source: Oral (02/12 1341) BP: 175/103 (02/12 1352) Pulse Rate: 155 (02/12 1352)  Labs: Recent Labs    03/17/18 0140 03/17/18 0211  WBC  --  11.9*  HGB  --  11.1*  PLT  --  129*  LABCREA 112.00  --   CREATININE  --  0.65      Microbiology: Recent Results (from the past 720 hour(s))  OB RESULT CONSOLE Group B Strep     Status: None   Collection Time: 02/20/18 12:00 AM  Result Value Ref Range Status   GBS Negative  Final  Culture, beta strep (group b only)     Status: None   Collection Time: 02/20/18 11:48 AM  Result Value Ref Range Status   Specimen Description VAGINAL/RECTAL  Final   Special Requests   Final    NONE Performed at [facility], [address] [place], [place] [address]    Culture NO GROUP B STREP (S.AGALACTIAE) ISOLATED  Final   Report Status 02/22/2018 FINAL  Final    Medications:  Ampicillin  Assessment: 33 y.o. female G1P1000 at 3296w2d - fever during labor Estimated Ke = 0.324 , Vd = 0.35 L/kg  Goal of Therapy:  Gentamicin peak 6-8 mg/L and Trough < 1 mg/L  Plan:  Gentamicin 160 mg IV every 8 hrs  Check Scr with next labs if gentamicin continued. Will check gentamicin levels if continued > 72hr or clinically indicated.  Natasha BenceCline, Bonna Steury 03/17/2018,2:49 PM

## 2018-03-17 NOTE — Anesthesia Postprocedure Evaluation (Signed)
Anesthesia Post Note  Patient: Jessica Carey  Procedure(s) Performed: SUTURE REPAIR PERINEAL LACERATION (N/A Vagina )     Patient location during evaluation: Other Anesthesia Type: Spinal Level of consciousness: awake and alert and oriented Pain management: pain level controlled Vital Signs Assessment: post-procedure vital signs reviewed and stable Respiratory status: spontaneous breathing, nonlabored ventilation and respiratory function stable Cardiovascular status: stable, tachycardic and unstable Postop Assessment: no headache, no backache and no apparent nausea or vomiting Anesthetic complications: no Comments: Transferred to [facility] PACU for complete resolution of spinal anesthetic and cardiac care is to be managed by hospitalists.    Last Vitals:  Vitals:   03/17/18 1416 03/17/18 1421  BP:  119/90  Pulse:  (!) 142  Resp:    Temp:    SpO2: 97% 100%    Last Pain:  Vitals:   03/17/18 1341  TempSrc: Oral  PainSc:    Pain Goal:                   Ashtian Villacis A.

## 2018-03-17 NOTE — Progress Notes (Signed)
Pharmacy Antibiotic Note  Jessica Carey is a 33 y.o. female admitted on 03/17/2018 now s/p vaginal delivery complicated with fourth degree laceration & a-fib.   Pharmacy has been consulted for Zosyn dosing for triple I and 4th degree laceration.  Plan: Zosyn 3.375g IV Q8 hours  Height: 5\' 4"  (162.6 cm) Weight: 215 lb 9.8 oz (97.8 kg) IBW/kg (Calculated) : 54.7  Temp (24hrs), Avg:99 F (37.2 C), Min:98.2 F (36.8 C), Max:100.6 F (38.1 C)  Recent Labs  Lab 03/17/18 0211  WBC 11.9*  CREATININE 0.65    Estimated Creatinine Clearance: 115.7 mL/min (by C-G formula based on SCr of 0.65 mg/dL).    No Known Allergies  Antimicrobials this admission: Ampicillin 2g IV Q6>> 03/17/18 Gentamicin 160mg  IV Q8>> 03/17/18   Thank you for allowing pharmacy to be a part of this patient's care.  Hovey-Rankin, Mithcell Schumpert 03/17/2018 5:13 PM

## 2018-03-18 ENCOUNTER — Encounter (HOSPITAL_COMMUNITY): Payer: Self-pay | Admitting: Radiology

## 2018-03-18 ENCOUNTER — Other Ambulatory Visit: Payer: BLUE CROSS/BLUE SHIELD | Admitting: Advanced Practice Midwife

## 2018-03-18 ENCOUNTER — Inpatient Hospital Stay (HOSPITAL_COMMUNITY): Payer: BLUE CROSS/BLUE SHIELD

## 2018-03-18 DIAGNOSIS — I480 Paroxysmal atrial fibrillation: Secondary | ICD-10-CM

## 2018-03-18 DIAGNOSIS — O139 Gestational [pregnancy-induced] hypertension without significant proteinuria, unspecified trimester: Secondary | ICD-10-CM

## 2018-03-18 DIAGNOSIS — O24415 Gestational diabetes mellitus in pregnancy, controlled by oral hypoglycemic drugs: Secondary | ICD-10-CM

## 2018-03-18 LAB — CBC
HCT: 32.1 % — ABNORMAL LOW (ref 36.0–46.0)
Hemoglobin: 10.6 g/dL — ABNORMAL LOW (ref 12.0–15.0)
MCH: 27.9 pg (ref 26.0–34.0)
MCHC: 33 g/dL (ref 30.0–36.0)
MCV: 84.5 fL (ref 80.0–100.0)
Platelets: 122 10*3/uL — ABNORMAL LOW (ref 150–400)
RBC: 3.8 MIL/uL — ABNORMAL LOW (ref 3.87–5.11)
RDW: 13.6 % (ref 11.5–15.5)
WBC: 24.3 10*3/uL — ABNORMAL HIGH (ref 4.0–10.5)
nRBC: 0 % (ref 0.0–0.2)

## 2018-03-18 LAB — BASIC METABOLIC PANEL
Anion gap: 11 (ref 5–15)
BUN: 6 mg/dL (ref 6–20)
CO2: 18 mmol/L — ABNORMAL LOW (ref 22–32)
Calcium: 8.5 mg/dL — ABNORMAL LOW (ref 8.9–10.3)
Chloride: 110 mmol/L (ref 98–111)
Creatinine, Ser: 0.77 mg/dL (ref 0.44–1.00)
GFR calc Af Amer: >60 mL/min (ref >60)
GFR calc non Af Amer: >60 mL/min (ref >60)
Glucose, Bld: 101 mg/dL — ABNORMAL HIGH (ref 70–99)
Potassium: 4 mmol/L (ref 3.5–5.1)
Sodium: 139 mmol/L (ref 135–145)

## 2018-03-18 LAB — URINALYSIS, ROUTINE W REFLEX MICROSCOPIC
Bacteria, UA: NONE SEEN
Bilirubin Urine: NEGATIVE
Glucose, UA: NEGATIVE mg/dL
Ketones, ur: NEGATIVE mg/dL
Leukocytes,Ua: NEGATIVE
Nitrite: NEGATIVE
Protein, ur: NEGATIVE mg/dL
Specific Gravity, Urine: 1.024 (ref 1.005–1.030)
pH: 6 (ref 5.0–8.0)

## 2018-03-18 LAB — MAGNESIUM: Magnesium: 1.8 mg/dL (ref 1.7–2.4)

## 2018-03-18 LAB — GLUCOSE, CAPILLARY
Glucose-Capillary: 128 mg/dL — ABNORMAL HIGH (ref 70–99)
Glucose-Capillary: 163 mg/dL — ABNORMAL HIGH (ref 70–99)
Glucose-Capillary: 157 mg/dL — ABNORMAL HIGH (ref 70–99)

## 2018-03-18 LAB — TROPONIN I: Troponin I: 0.04 ng/mL (ref <0.03)

## 2018-03-18 MED ORDER — OXYCODONE HCL 5 MG PO TABS
5.0000 mg | ORAL_TABLET | ORAL | Status: DC | PRN
  Administered 2018-03-18: 5 mg via ORAL
  Filled 2018-03-18: qty 1

## 2018-03-18 MED ORDER — MAGNESIUM SULFATE 2 GM/50ML IV SOLN
2.0000 g | Freq: Once | INTRAVENOUS | Status: AC
  Administered 2018-03-18: 2 g via INTRAVENOUS
  Filled 2018-03-18: qty 50

## 2018-03-18 MED ORDER — IBUPROFEN 600 MG PO TABS
600.0000 mg | ORAL_TABLET | Freq: Four times a day (QID) | ORAL | Status: DC
  Administered 2018-03-18 – 2018-03-22 (×17): 600 mg via ORAL
  Filled 2018-03-18 (×18): qty 1

## 2018-03-18 MED ORDER — SODIUM CHLORIDE 0.9 % IV SOLN
INTRAVENOUS | Status: DC | PRN
  Administered 2018-03-18: 10 mL via INTRAVENOUS

## 2018-03-18 MED ORDER — PERFLUTREN LIPID MICROSPHERE
INTRAVENOUS | Status: AC
  Filled 2018-03-18: qty 10

## 2018-03-18 MED ORDER — ACETAMINOPHEN 500 MG PO TABS
1000.0000 mg | ORAL_TABLET | Freq: Four times a day (QID) | ORAL | Status: DC | PRN
  Filled 2018-03-18: qty 2

## 2018-03-18 MED ORDER — METOPROLOL TARTRATE 12.5 MG HALF TABLET
12.5000 mg | ORAL_TABLET | Freq: Two times a day (BID) | ORAL | Status: DC
  Administered 2018-03-18 – 2018-03-19 (×2): 12.5 mg via ORAL
  Filled 2018-03-18 (×2): qty 1

## 2018-03-18 NOTE — Progress Notes (Signed)
RROB nurse came to bring patient postpartum supplies including ice packs, peri pads, and breast pump attachments.  Fundal massage performed.  Uterus is U1 with scant lochia.  Pumping education done and pt pumped for .  Family to take breastmilk to womens NICU.

## 2018-03-18 NOTE — Progress Notes (Signed)
Post Partum Day 1 s/p SVD at [redacted]w[redacted]d complicated by Shoulder Dystocia, Fourth Degree Laceration requiring repair in OR, Atrial Fibrillation, IDDM, Preeclampsia.   Subjective: No complaints and tolerating PO.  Moderate lochia. Foley recently removed, no void yet.  Patient denies any headaches, visual symptoms, RUQ/epigastric pain or other concerning symptoms.   Objective: Blood pressure 123/86, pulse 78, temperature 98.4 F (36.9 C), resp. rate 16, height 5\' 4"  (1.626 m), weight 94.9 kg, last menstrual period 06/10/2017, SpO2 100 %, unknown if currently breastfeeding.  Physical Exam:  General: alert and no distress Lochia: appropriate Uterine Fundus: firm, mild TTP Perineum: healing well, mild edema. Intact repair DVT Evaluation: No evidence of DVT seen on physical exam. Negative Homan's sign. No cords or calf tenderness. No significant calf/ankle edema.  Labs: CBC Latest Ref Rng & Units 03/18/2018 03/17/2018 03/17/2018  WBC 4.0 - 10.5 K/uL 24.3(H) 27.9(H) 11.9(H)  Hemoglobin 12.0 - 15.0 g/dL 10.6(L) 10.5(L) 11.1(L)  Hematocrit 36.0 - 46.0 % 32.1(L) 31.6(L) 33.8(L)  Platelets 150 - 400 K/uL 122(L) 121(L) 129(L)   CMP Latest Ref Rng & Units 03/18/2018 03/17/2018 03/17/2018  Glucose 70 - 99 mg/dL 008(Q) 761(P) 82  BUN 6 - 20 mg/dL 6 7 12   Creatinine 0.44 - 1.00 mg/dL 5.09 3.26 7.12  Sodium 135 - 145 mmol/L 139 135 133(L)  Potassium 3.5 - 5.1 mmol/L 4.0 3.8 3.8  Chloride 98 - 111 mmol/L 110 106 108  CO2 22 - 32 mmol/L 18(L) 18(L) 19(L)  Calcium 8.9 - 10.3 mg/dL 4.5(Y) 8.3(L) 8.4(L)  Total Protein 6.5 - 8.1 g/dL - 5.0(L) 6.3(L)  Total Bilirubin 0.3 - 1.2 mg/dL - 0.3 0.3  Alkaline Phos 38 - 126 U/L - 107 144(H)  AST 15 - 41 U/L - 47(H) 19  ALT 0 - 44 U/L - 19 13    Assessment/Plan: Appreciate the care of our patient by the [facility], will defer all management of her cardiac and medical issues (DM and HTN) to their expertise.  For her 4th degree laceration, continue  Zosyn for at least 48 hours, continue Colace 100 mg tid.  Pain medication as needed.  Encourage ambulation, OOB Lactation support as needed Routine postpartum care. We will continue to follow along. Please call [phone] for any postpartum concerns.    LOS: 1 day   Jaynie Collins, MD 03/18/2018, 2:59 PM

## 2018-03-18 NOTE — Progress Notes (Signed)
  Echocardiogram 2D Echocardiogram has been performed.  Jessica Carey 03/18/2018, 4:06 PM

## 2018-03-18 NOTE — Progress Notes (Addendum)
PROGRESS NOTE  Jessica Carey  KGU:[id number] DOB: [birth year] DOA: 03/17/2018 PCP: Patient, No Pcp Per  Brief Narrative: Jessica Carey is a 33 y.o. G1P1 s/p NSVD at [redacted]w[redacted]d on 2/12 complicated by pre-E, shoulder dystocia and 4th degree perineal laceration, with a history of PAF and T2DM which worsened requiring insulin with steroids given for short cervix during pregnancy. Labored without analgesia, complicated by fever for which ampicillin was given and rapid A Flutter. Cardiology was consulted and diltiazem gtt started, patient transferred postpartum following operative repair of laceration to [facility]. Zosyn was started given severity of laceration. Baby girl had APGARs 1, 3, transferred to NICU. D-dimer noted to be elevated and subsequent CTA chest showed no PE, mild cardiomegaly, and pulmonary GGOs, nonspecific.   Assessment & Plan: Active Problems:   Atrial fibrillation (HCC)   Gestational hypertension   Paroxysmal A-fib (HCC)   Fourth degree perineal laceration   Shoulder dystocia, delivered  Paroxysmal atrial fibrillation, atrial flutter with RVR: Had not taken labetalol as prescribed by cardiology due to concerns for fetotoxicity. RVR precipitated by traumatic delivery, sepsis due to possible chorioamnionitis. TSH 1.426 - Continue diltiazem gtt, rate labile this morning, but improving on 5mg /hr. - Keep K >4 and Mg >2, give Mg now - Cardiology consulted for assistance in management - CHA2DS2-VASc score is 2. Holding anticoagulation with recent laceration repair.  - Echocardiogram pending  Sepsis due to chorioamnionitis: - Treated with delivery, continuing zosyn per OB recommendations for perineal laceration with rectal mucosa involvement.  IDT2DM:  - Continue basal-bolus insulin. Hold OSU and cover with SSI instead. Decreased lantus dose initially. CBG at inpatient goal so will not change, but continue AC/HS monitoring. - Carb-modified diet  HTN: BP normotensive - Continue monitoring with  rate control agents.  Troponin elevation: Mild, downward trending consistent with demand ischemia.  - Echo pending. Further interventions/work up per cardiology  Elevated d-dimer: Negative CTA PE study.  Day 1 postpartum, breast feeding:  - Appreciate OB evaluations for routine postpartum and wound care.  - Lactation consultant to come from [facility] for assistance - Initial review of beta blockers (metoprolol, labetalol) and diltiazem suggests infant risk to be minimal and compatible with breastfeeding per AAP.  DVT prophylaxis: SCDs Code Status: Full Family Communication: Parents at bedside Disposition Plan: Home once work up complete, cardiac status stabilizes, and cleared by OB.  Consultants:   OB/GYN  Cardiology  Procedures:   Operative repair of fourth degree perineal laceration 03/17/2018 by Dr. Macon Large.  Antimicrobials:  Zosyn 2/12 >>    Subjective: Lower abdominal, perineal pain is severe, improved dramatically with fentanyl though effect wears off quickly. Moderate lochia, no frank bleeding. No fevers. Denies chest pain, dyspnea, palpitations since after delivery.  Objective: Vitals:   03/18/18 0444 03/18/18 0500 03/18/18 0714 03/18/18 1100  BP: 133/90  130/88 123/86  Pulse: 81  63 78  Resp: 16  18 16   Temp: 98.8 F (37.1 C)  98.7 F (37.1 C) 98.4 F (36.9 C)  TempSrc: Oral     SpO2:   99% 100%  Weight:  94.9 kg    Height:        Intake/Output Summary (Last 24 hours) at 03/18/2018 1448 Last data filed at 03/18/2018 1300 Gross per 24 hour  Intake 3275.83 ml  Output 6700 ml  Net -3424.17 ml   Filed Weights   03/17/18 0121 03/17/18 0819 03/18/18 0500  Weight: 97.9 kg 97.8 kg 94.9 kg    Gen: 33 y.o. female in no distress  Pulm: Non-labored breathing room air. Clear to auscultation laterally.  CV: Irreg irreg with rate in 70's initially though increased to 130's with activity. No murmur, rub, or gallop. No JVD, trace pedal edema. GI: Abdomen soft,  lower abd tenderness with firm uterus below level of umbilicus, non-distended, + bowel sounds.  Ext: Warm, no deformities Skin: No rashes, lesions or ulcers on visualized skin. Perineal exam deferred. Neuro: Alert and oriented. No focal neurological deficits. Psych: Judgement and insight appear normal. Mood & affect appropriate.   Data Reviewed: I have personally reviewed following labs and imaging studies  CBC: Recent Labs  Lab 03/17/18 0211 03/17/18 2207 03/18/18 0335  WBC 11.9* 27.9* 24.3*  NEUTROABS  --  24.8*  --   HGB 11.1* 10.5* 10.6*  HCT 33.8* 31.6* 32.1*  MCV 87.1 84.3 84.5  PLT 129* 121* 122*   Basic Metabolic Panel: Recent Labs  Lab 03/17/18 0211 03/17/18 2207 03/18/18 0335  NA 133* 135 139  K 3.8 3.8 4.0  CL 108 106 110  CO2 19* 18* 18*  GLUCOSE 82 214* 101*  BUN 12 7 6   CREATININE 0.65 0.84 0.77  CALCIUM 8.4* 8.3* 8.5*  MG  --  1.7 1.8  PHOS  --  3.6  --    GFR: Estimated Creatinine Clearance: 113.9 mL/min (by C-G formula based on SCr of 0.77 mg/dL). Liver Function Tests: Recent Labs  Lab 03/17/18 0211 03/17/18 2207  AST 19 47*  ALT 13 19  ALKPHOS 144* 107  BILITOT 0.3 0.3  PROT 6.3* 5.0*  ALBUMIN 2.6* 1.9*   No results for input(s): LIPASE, AMYLASE in the last 168 hours. No results for input(s): AMMONIA in the last 168 hours. Coagulation Profile: Recent Labs  Lab 03/17/18 2207  INR 1.02   Cardiac Enzymes: Recent Labs  Lab 03/17/18 2207 03/18/18 0335  TROPONINI 0.07* 0.04*   BNP (last 3 results) No results for input(s): PROBNP in the last 8760 hours. HbA1C: No results for input(s): HGBA1C in the last 72 hours. CBG: Recent Labs  Lab 03/17/18 1105 03/17/18 1627 03/17/18 2135 03/18/18 0132 03/18/18 1216  GLUCAP 108* 171* 220* 128* 163*   Lipid Profile: No results for input(s): CHOL, HDL, LDLCALC, TRIG, CHOLHDL, LDLDIRECT in the last 72 hours. Thyroid Function Tests: Recent Labs    03/17/18 2207  TSH 1.426   Anemia  Panel: No results for input(s): VITAMINB12, FOLATE, FERRITIN, TIBC, IRON, RETICCTPCT in the last 72 hours. Urine analysis:    Component Value Date/Time   COLORURINE STRAW (A) 03/17/2018 2154   APPEARANCEUR CLEAR 03/17/2018 2154   APPEARANCEUR Clear 09/02/2017 1430   LABSPEC 1.024 03/17/2018 2154   PHURINE 6.0 03/17/2018 2154   GLUCOSEU NEGATIVE 03/17/2018 2154   HGBUR SMALL (A) 03/17/2018 2154   BILIRUBINUR NEGATIVE 03/17/2018 2154   BILIRUBINUR Negative 09/02/2017 1430   KETONESUR NEGATIVE 03/17/2018 2154   PROTEINUR NEGATIVE 03/17/2018 2154   NITRITE NEGATIVE 03/17/2018 2154   LEUKOCYTESUR NEGATIVE 03/17/2018 2154   No results found for this or any previous visit (from the past 240 hour(s)).    Radiology Studies: Ct Angio Chest Pe W Or Wo Contrast  Result Date: 03/18/2018 CLINICAL DATA:  Status post vaginal delivery. Atrial fibrillation. Elevated D-dimer. EXAM: CT ANGIOGRAPHY CHEST WITH CONTRAST TECHNIQUE: Multidetector CT imaging of the chest was performed using the standard protocol during bolus administration of intravenous contrast. Multiplanar CT image reconstructions and MIPs were obtained to evaluate the vascular anatomy. CONTRAST:  ISOVUE-370 IOPAMIDOL (ISOVUE-370) INJECTION 76% COMPARISON:  None. FINDINGS: Cardiovascular: No filling defects in the pulmonary arteries to suggest pulmonary emboli. Heart is mildly enlarged. Aorta is normal caliber. Mediastinum/Nodes: No mediastinal, hilar, or axillary adenopathy. Lungs/Pleura: Low lung volumes. Ground-glass opacities in the lungs could reflect atelectasis or edema. No effusions. Upper Abdomen: Imaging into the upper abdomen shows no acute findings. Musculoskeletal: Chest wall soft tissues are unremarkable. No acute bony abnormality. Review of the MIP images confirms the above findings. IMPRESSION: Mild cardiomegaly, likely related to postpartum state. Ground-glass opacities within the lungs could reflect atelectasis or edema. No  evidence of pulmonary embolus. Electronically Signed   By: Charlett NoseKevin  Dover M.D.   On: 03/18/2018 00:33    Scheduled Meds: . docusate sodium  100 mg Oral TID  . insulin aspart  0-15 Units Subcutaneous TID WC  . insulin aspart  0-5 Units Subcutaneous QHS  . insulin glargine  15 Units Subcutaneous QHS  . prenatal vitamin w/FE, FA  1 tablet Oral Daily  . sodium chloride flush  3 mL Intravenous Q12H   Continuous Infusions: . sodium chloride    . diltiazem (CARDIZEM) infusion 5 mg/hr (03/18/18 0214)  . piperacillin-tazobactam (ZOSYN)  IV 3.375 g (03/18/18 1005)     LOS: 1 day   Time spent: 35 minutes.  Tyrone Nineyan B Grunz, MD [facility] [email or web address] Password [email or web address] 03/18/2018, 2:48 PM

## 2018-03-18 NOTE — Progress Notes (Signed)
Parent's Worksheet for [employer] Birth Certificate faxed to [place] at [phone].

## 2018-03-18 NOTE — Consult Note (Addendum)
Cardiology Consultation:   Patient ID: Jessica Carey MRN: [id number]; DOB: [birth year]  Admit date: 03/17/2018 Date of Consult: 03/18/2018  Primary Care Provider: Patient, No Pcp Per Primary Cardiologist: Dina Rich, MD  Primary Electrophysiologist:  None    Patient Profile:   Jessica Carey is a 33 year old post partum female s/p vaginal delivery 03/17/18 as well as prior h/o PAF who is being seen today for the evaluation of atrial flutter w/ RVR, at the request of Dr. Jarvis Newcomer, Internal Medicine.   History of Present Illness:    Jessica Carey is a 33 year old post partum female s/p vaginal delivery 03/17/18 as well as prior h/o PAF who is being seen today for the evaluation of atrial flutter w/ RVR, at the request of Dr. Jarvis Newcomer, Internal Medicine.   She was seen by Dr. Wyline Mood in August 2019 for new onset atrial fibrillation. At that time, she was [redacted] weeks pregnant. When seen by Dr. Wyline Mood, she was back in NSR w/ PACs. Echo showed normal LVEF at 55-60% and no significant wall motion abnormalities. Dr. Wyline Mood recommended labetalol, given her pregnancy,100 mg BID PRN for palpations. Her CHA2DS2 VASc score was calculated at 2. Anticoagulation was considered however Dr. Wyline Mood discussed with EP. Per consult note, "After discussing case with some of EP staff, and reviewing most recent guidelines would not recommend the use of anticoagulation at this time or antiplatet therapy. 2019 afib guideline update downgraded women with CHADS2Vasc score of 2 and anticoagulation to a IIB recommendation, with language indicating anticoag "may be considered" There is debate on whether gender alone imparts increased risk in absence of other differences in risk factors. Overall there is no strong recommendation for anticoag in this setting based on guidelines, in the setting of pregnancy the risk of complications is only increased. Continue prn labetalol for symptoms. Data would also argue against the use of antiplatelet  therapy, with the guidelines referencing a study showing net harm in this clinical situation".   Pt was admitted to [facility] yesterday in early labor and felt to be in new onset Pre eclampsia and required early delivery at only 38 weeks. She had vaginal delivery c/b 4th degree perineal laceration requiring operative repair. Pt developed atrial flutter w/ RVR in the 150s and started on a Cardizem drip and transferred to [facility] for further management. Cardiology consulted for recommendations. Pt now back in NSR. Per history from pt, she has not been taking labetalol for palpitations due to side effects. Also states it was not working to help symptoms.   Also D-dimer was elevated but chest CT negative for PE. Echo pending. trops 0.07>>0.04, likely demand ischemia.    Past Medical History:  Diagnosis Date  . Boil, breast 07/04/2013  . Diabetes (HCC) 08/04/2013  . Diabetes mellitus without complication (HCC)    glyburide and insulin  . Dysrhythmia    a-fib  . Irregular periods     Past Surgical History:  Procedure Laterality Date  . BREAST SURGERY Right    breast abcess  . PERINEAL LACERATION REPAIR N/A 03/17/2018   Procedure: SUTURE REPAIR PERINEAL LACERATION;  Surgeon: Tereso Newcomer, MD;  Location: [facility];  Service: Gynecology;  Laterality: N/A;  . SURGERY OF LIP       Home Medications:  Prior to Admission medications   Medication Sig Start Date End Date Taking? Authorizing Provider  glyBURIDE (DIABETA) 5 MG tablet Take 2 tablets (10 mg total) by mouth at bedtime. 02/05/18  Yes Duane Lope  H, MD  insulin glargine (LANTUS) 100 UNIT/ML injection Inject 0.26 mLs (26 Units total) into the skin daily. Patient taking differently: Inject 26 Units into the skin every morning.  02/21/18  Yes Constant, Peggy, MD  metFORMIN (GLUCOPHAGE) 1000 MG tablet Take 1 tablet (1,000 mg total) by mouth 2 (two) times daily with a meal. 09/23/17  Yes Booker, Merlene LaughterKimberly R, CNM  Prenatal Vit-Fe  Fumarate-FA (PNV PRENATAL PLUS MULTIVITAMIN) 27-1 MG TABS Take 1 tablet by mouth daily. 09/02/17  Yes Cresenzo-Dishmon, Scarlette CalicoFrances, CNM  NIFEdipine (PROCARDIA-XL/NIFEDICAL-XL) 30 MG 24 hr tablet Take 1 tablet (30 mg total) by mouth daily. Can increase to twice a day as needed for symptomatic contractions 02/21/18   Constant, Peggy, MD  progesterone (PROMETRIUM) 200 MG capsule Place 1 tablet in vagina every night Patient not taking: Reported on 03/08/2018 12/09/17   Jacklyn Shellresenzo-Dishmon, Frances, CNM    Inpatient Medications: Scheduled Meds: . docusate sodium  100 mg Oral TID  . insulin aspart  0-15 Units Subcutaneous TID WC  . insulin aspart  0-5 Units Subcutaneous QHS  . insulin glargine  15 Units Subcutaneous QHS  . perflutren lipid microspheres (DEFINITY) IV suspension      . prenatal vitamin w/FE, FA  1 tablet Oral Daily  . sodium chloride flush  3 mL Intravenous Q12H   Continuous Infusions: . sodium chloride    . diltiazem (CARDIZEM) infusion 5 mg/hr (03/18/18 0214)  . magnesium sulfate 1 - 4 g bolus IVPB    . piperacillin-tazobactam (ZOSYN)  IV 3.375 g (03/18/18 1005)   PRN Meds: sodium chloride, fentaNYL (SUBLIMAZE) injection  Allergies:   No Known Allergies  Social History:   Social History   Socioeconomic History  . Marital status: Single    Spouse name: Not on file  . Number of children: Not on file  . Years of education: Not on file  . Highest education level: Not on file  Occupational History  . Not on file  Social Needs  . Financial resource strain: Not hard at all  . Food insecurity:    Worry: Never true    Inability: Never true  . Transportation needs:    Medical: No    Non-medical: Not on file  Tobacco Use  . Smoking status: Never Smoker  . Smokeless tobacco: Never Used  Substance and Sexual Activity  . Alcohol use: Not Currently    Comment: occ.  . Drug use: No  . Sexual activity: Yes    Partners: Male    Birth control/protection: None  Lifestyle  .  Physical activity:    Days per week: Not on file    Minutes per session: Not on file  . Stress: Rather much  Relationships  . Social connections:    Talks on phone: Not on file    Gets together: Not on file    Attends religious service: Not on file    Active member of club or organization: Not on file    Attends meetings of clubs or organizations: Not on file    Relationship status: Not on file  . Intimate partner violence:    Fear of current or ex partner: No    Emotionally abused: No    Physically abused: No    Forced sexual activity: No  Other Topics Concern  . Not on file  Social History Narrative   ** Merged History Encounter **        Family History:    Family History  Problem Relation Age of Onset  .  Diabetes Mother   . Diabetes Father   . Hyperlipidemia Father   . Diabetes Sister   . Cancer Maternal Aunt        liver  . Diabetes Paternal Grandmother      ROS:  Please see the history of present illness.   All other ROS reviewed and negative.     Physical Exam/Data:   Vitals:   03/18/18 0444 03/18/18 0500 03/18/18 0714 03/18/18 1100  BP: 133/90  130/88 123/86  Pulse: 81  63 78  Resp: 16  18 16   Temp: 98.8 F (37.1 C)  98.7 F (37.1 C) 98.4 F (36.9 C)  TempSrc: Oral     SpO2:   99% 100%  Weight:  94.9 kg    Height:        Intake/Output Summary (Last 24 hours) at 03/18/2018 1548 Last data filed at 03/18/2018 1300 Gross per 24 hour  Intake 3275.83 ml  Output 6700 ml  Net -3424.17 ml   Last 3 Weights 03/18/2018 03/17/2018 03/17/2018  Weight (lbs) 209 lb 3.5 oz 215 lb 9.8 oz 215 lb 12 oz  Weight (kg) 94.9 kg 97.8 kg 97.864 kg     Body mass index is 35.91 kg/m.  General:  Young obese female in no acute distress HEENT: normal Lymph: no adenopathy Neck: no JVD Endocrine:  No thryomegaly Vascular: No carotid bruits; FA pulses 2+ bilaterally without bruits  Cardiac:  normal S1, S2; RRR; no murmur  Lungs:  clear to auscultation bilaterally, no  wheezing, rhonchi or rales  Abd: soft, nontender, no hepatomegaly  Ext: no edema Musculoskeletal:  No deformities, BUE and BLE strength normal and equal Skin: warm and dry  Neuro:  CNs 2-12 intact, no focal abnormalities noted Psych:  Normal affect   EKG:  The EKG was personally reviewed and demonstrates:  Atrial flutter w/ RVR 2/12 @1404 , repeat EKG 2/13 showed NSR Telemetry:  Telemetry was personally reviewed and demonstrates:  NSR 80s  Relevant CV Studies: 2D Echo 09/2017 Study Conclusions  - Left ventricle: The cavity size was normal. Wall thickness was   normal. Systolic function was normal. The estimated ejection   fraction was in the range of 55% to 60%. Wall motion was normal;   there were no regional wall motion abnormalities. Left   ventricular diastolic function parameters were normal. - Aortic valve: Valve area (VTI): 1.79 cm^2. Valve area (Vmax):   1.74 cm^2. Valve area (Vmean): 1.85 cm^2. - Mitral valve: Valve area by continuity equation (using LVOT   flow): 1.76 cm^2. - Left atrium: The atrium was mildly dilated. - Atrial septum: No defect or patent foramen ovale was identified. - Technically adequate study.  Laboratory Data:  Chemistry Recent Labs  Lab 03/17/18 0211 03/17/18 2207 03/18/18 0335  NA 133* 135 139  K 3.8 3.8 4.0  CL 108 106 110  CO2 19* 18* 18*  GLUCOSE 82 214* 101*  BUN 12 7 6   CREATININE 0.65 0.84 0.77  CALCIUM 8.4* 8.3* 8.5*  GFRNONAA >60 >60 >60  GFRAA >60 >60 >60  ANIONGAP 6 11 11     Recent Labs  Lab 03/17/18 0211 03/17/18 2207  PROT 6.3* 5.0*  ALBUMIN 2.6* 1.9*  AST 19 47*  ALT 13 19  ALKPHOS 144* 107  BILITOT 0.3 0.3   Hematology Recent Labs  Lab 03/17/18 0211 03/17/18 2207 03/18/18 0335  WBC 11.9* 27.9* 24.3*  RBC 3.88 3.75* 3.80*  HGB 11.1* 10.5* 10.6*  HCT 33.8* 31.6* 32.1*  MCV 87.1  84.3 84.5  MCH 28.6 28.0 27.9  MCHC 32.8 33.2 33.0  RDW 14.0 13.6 13.6  PLT 129* 121* 122*   Cardiac Enzymes Recent Labs    Lab 03/17/18 2207 03/18/18 0335  TROPONINI 0.07* 0.04*   No results for input(s): TROPIPOC in the last 168 hours.  BNPNo results for input(s): BNP, PROBNP in the last 168 hours.  DDimer  Recent Labs  Lab 03/17/18 2207  DDIMER 15.97*    Radiology/Studies:  Ct Angio Chest Pe W Or Wo Contrast  Result Date: 03/18/2018 CLINICAL DATA:  Status post vaginal delivery. Atrial fibrillation. Elevated D-dimer. EXAM: CT ANGIOGRAPHY CHEST WITH CONTRAST TECHNIQUE: Multidetector CT imaging of the chest was performed using the standard protocol during bolus administration of intravenous contrast. Multiplanar CT image reconstructions and MIPs were obtained to evaluate the vascular anatomy. CONTRAST:  100mL ISOVUE-370 IOPAMIDOL (ISOVUE-370) INJECTION 76% COMPARISON:  None. FINDINGS: Cardiovascular: No filling defects in the pulmonary arteries to suggest pulmonary emboli. Heart is mildly enlarged. Aorta is normal caliber. Mediastinum/Nodes: No mediastinal, hilar, or axillary adenopathy. Lungs/Pleura: Low lung volumes. Ground-glass opacities in the lungs could reflect atelectasis or edema. No effusions. Upper Abdomen: Imaging into the upper abdomen shows no acute findings. Musculoskeletal: Chest wall soft tissues are unremarkable. No acute bony abnormality. Review of the MIP images confirms the above findings. IMPRESSION: Mild cardiomegaly, likely related to postpartum state. Ground-glass opacities within the lungs could reflect atelectasis or edema. No evidence of pulmonary embolus. Electronically Signed   By: Charlett NoseKevin  Dover M.D.   On: 03/18/2018 00:33    Assessment and Plan:   Jessica Carey is a 33 year old post partum female s/p vaginal delivery 03/17/18 as well as prior h/o PAF who is being seen today for the evaluation of atrial flutter w/ RVR, at the request of Dr. Jarvis NewcomerGrunz, Internal Medicine.   1. Paroxsymal Atrial Fibrillation/ Flutter: Documented afib in August 2019. Now with occurrence of atrial flutter w/ RVR  in the 150s. K and TSH WNL. Chest CT negative for PE. Echo pending. Spontaneous conversion back to NSR with IV Cardizem. Remains in NSR currently w/ HR in the 80s. Was previously placed on labetalol PRN given pregnancy. Anticoagulation was also avoided during pregnancy. CHA2DS2 VASc score is 2 for DM and female sex. She is now post delivery. MD to see and will advise further management.    2. Post Partum Care: per IM and OBGYN.     For questions or updates, please contact [facility] Please consult [email or web address] for contact info under     Signed, Robbie LisBrittainy Colter Magowan, PA-C  03/18/2018 3:48 PM

## 2018-03-19 DIAGNOSIS — Z23 Encounter for immunization: Secondary | ICD-10-CM | POA: Diagnosis not present

## 2018-03-19 DIAGNOSIS — Q211 Atrial septal defect: Secondary | ICD-10-CM | POA: Diagnosis not present

## 2018-03-19 DIAGNOSIS — K006 Disturbances in tooth eruption: Secondary | ICD-10-CM | POA: Diagnosis not present

## 2018-03-19 DIAGNOSIS — I4891 Unspecified atrial fibrillation: Secondary | ICD-10-CM

## 2018-03-19 LAB — CBC
HCT: 31.2 % — ABNORMAL LOW (ref 36.0–46.0)
Hemoglobin: 10 g/dL — ABNORMAL LOW (ref 12.0–15.0)
MCH: 27.9 pg (ref 26.0–34.0)
MCHC: 32.1 g/dL (ref 30.0–36.0)
MCV: 86.9 fL (ref 80.0–100.0)
Platelets: 114 10*3/uL — ABNORMAL LOW (ref 150–400)
RBC: 3.59 MIL/uL — ABNORMAL LOW (ref 3.87–5.11)
RDW: 13.9 % (ref 11.5–15.5)
WBC: 16.5 10*3/uL — ABNORMAL HIGH (ref 4.0–10.5)
nRBC: 0 % (ref 0.0–0.2)

## 2018-03-19 LAB — BASIC METABOLIC PANEL
Anion gap: 9 (ref 5–15)
BUN: 14 mg/dL (ref 6–20)
CO2: 22 mmol/L (ref 22–32)
Calcium: 8.5 mg/dL — ABNORMAL LOW (ref 8.9–10.3)
Chloride: 107 mmol/L (ref 98–111)
Creatinine, Ser: 0.94 mg/dL (ref 0.44–1.00)
GFR calc Af Amer: >60 mL/min (ref >60)
GFR calc non Af Amer: >60 mL/min (ref >60)
Glucose, Bld: 97 mg/dL (ref 70–99)
Potassium: 3.8 mmol/L (ref 3.5–5.1)
Sodium: 138 mmol/L (ref 135–145)

## 2018-03-19 LAB — MAGNESIUM: Magnesium: 2 mg/dL (ref 1.7–2.4)

## 2018-03-19 LAB — GLUCOSE, CAPILLARY
Glucose-Capillary: 143 mg/dL — ABNORMAL HIGH (ref 70–99)
Glucose-Capillary: 149 mg/dL — ABNORMAL HIGH (ref 70–99)
Glucose-Capillary: 112 mg/dL — ABNORMAL HIGH (ref 70–99)
Glucose-Capillary: 129 mg/dL — ABNORMAL HIGH (ref 70–99)
Glucose-Capillary: 176 mg/dL — ABNORMAL HIGH (ref 70–99)

## 2018-03-19 MED ORDER — METOPROLOL TARTRATE 25 MG PO TABS
25.0000 mg | ORAL_TABLET | Freq: Two times a day (BID) | ORAL | Status: DC
  Administered 2018-03-19 – 2018-03-20 (×2): 25 mg via ORAL
  Filled 2018-03-19 (×2): qty 1

## 2018-03-19 MED ORDER — MAGNESIUM HYDROXIDE 400 MG/5ML PO SUSP
30.0000 mL | Freq: Every day | ORAL | Status: DC | PRN
  Administered 2018-03-19: 30 mL via ORAL
  Filled 2018-03-19: qty 30

## 2018-03-19 MED ORDER — METOPROLOL TARTRATE 25 MG PO TABS: 25.0000 mg | ORAL_TABLET | Freq: Two times a day (BID) | ORAL | 0 refills | Status: DC

## 2018-03-19 MED ORDER — FLECAINIDE ACETATE 50 MG PO TABS: 50.0000 mg | ORAL_TABLET | Freq: Two times a day (BID) | ORAL | Status: DC

## 2018-03-19 MED ORDER — DOCUSATE SODIUM 100 MG PO CAPS: 100.0000 mg | ORAL_CAPSULE | Freq: Two times a day (BID) | ORAL | 0 refills | Status: DC

## 2018-03-19 MED ORDER — METOPROLOL TARTRATE 5 MG/5ML IV SOLN
INTRAVENOUS | Status: AC
  Filled 2018-03-19: qty 10

## 2018-03-19 MED ORDER — FLECAINIDE ACETATE 50 MG PO TABS
50.0000 mg | ORAL_TABLET | Freq: Two times a day (BID) | ORAL | Status: DC
  Administered 2018-03-19 – 2018-03-21 (×5): 50 mg via ORAL
  Filled 2018-03-19 (×6): qty 1

## 2018-03-19 MED ORDER — METOPROLOL TARTRATE 5 MG/5ML IV SOLN
10.0000 mg | Freq: Once | INTRAVENOUS | Status: AC
  Administered 2018-03-19: 10 mg via INTRAVENOUS

## 2018-03-19 NOTE — Progress Notes (Signed)
Post Partum Day 2 Subjective: + flatus and no bowel movement since prior to delivery Normal lochia, no perineal pain Objective: Blood pressure 130/79, pulse 74, temperature 98.3 F (36.8 C), temperature source Oral, resp. rate (!) 26, height 5\' 4"  (1.626 m), weight 89.3 kg, last menstrual period 06/10/2017, SpO2 97 %, unknown if currently breastfeeding.  Physical Exam:  General: alert, cooperative and no distress Lochia: appropriate Uterine Fundus: firm Incision: perineum not examined DVT Evaluation: No evidence of DVT seen on physical exam.  Recent Labs    03/18/18 0335 03/19/18 0326  HGB 10.6* 10.0*  HCT 32.1* 31.2*   CBC    Component Value Date/Time   WBC 16.5 (H) 03/19/2018 0326   RBC 3.59 (L) 03/19/2018 0326   HGB 10.0 (L) 03/19/2018 0326   HGB 11.3 01/06/2018 1216   HCT 31.2 (L) 03/19/2018 0326   HCT 32.8 (L) 01/06/2018 1216   PLT 114 (L) 03/19/2018 0326   PLT 176 01/06/2018 1216   MCV 86.9 03/19/2018 0326   MCV 86 01/06/2018 1216   MCH 27.9 03/19/2018 0326   MCHC 32.1 03/19/2018 0326   RDW 13.9 03/19/2018 0326   RDW 13.4 01/06/2018 1216   LYMPHSABS 2.0 03/17/2018 2207   LYMPHSABS 2.9 09/02/2017 1536   MONOABS 0.8 03/17/2018 2207   EOSABS 0.0 03/17/2018 2207   EOSABS 0.1 09/02/2017 1536   BASOSABS 0.0 03/17/2018 2207   BASOSABS 0.0 09/02/2017 1536     Assessment/Plan: Doing well postpartum day 2 Afebrile, antibiotic was discontinued after the next dose ant 1730 Milk of magnesia for constipation Once cleared for discharge by cardiology she can have outpatient postpartum care as scheduled at [facility] Continue Colace after discharge for one month   LOS: 2 days   Scheryl Darter 03/19/2018, 3:42 PM

## 2018-03-19 NOTE — Discharge Summary (Signed)
Physician Discharge Summary  Jessica Carey ZOX:[id number] DOB: 15-May-1985 DOA: 03/17/2018  PCP: Patient, No Pcp Per  Admit date: 03/17/2018 Discharge date: 03/19/2018  Admitted From: Home  Disposition:  Home   Recommendations for Outpatient Follow-up:  1. Follow up with PCP in 1-2 weeks 2. Please obtain BMP/CBC in one week your next doctors visit.  3. Follow up Cardiology in 3 weeks  4. Follow up with OBGYN per their recs  5. Take Metoprolol 25mg  orally bid   Home Health: None  Equipment/Devices: None Discharge Condition: Stable CODE STATUS: Full  Diet recommendation:  Regular  Brief/Interim Summary: 33 year old G1, P1 status post delivery on 2/20 ended up causing fourth degree laceration which was treated.  Her delivery course was complicated by atrial flutter.  She was started on Cardizem drip and she was transferred to [facility] for further care.  Cardiology team was consulted.  Echocardiogram showed ejection fraction 55 to 60% without significant wall motion abnormality.  Metoprolol 12.5 mg twice daily was added.  No anticoagulation due to vaginal bleeding risk but this will be revisited later in the future.  Eventually meteprolol was increased to 35m po bid. Ob recommended discontinuing Zosyn at the time of discharge. No other Abx.  Ok for discharge today. Cleared by Ob and Cardiology.    Discharge Diagnoses:  Active Problems:   Atrial fibrillation (HCC)   Gestational hypertension   Paroxysmal A-fib (HCC)   Fourth degree perineal laceration   Shoulder dystocia, delivered   Gestational diabetes mellitus (GDM) controlled on oral hypoglycemic drug, antepartum  Paroxysmal atrial fibrillation with atrial flutter and RVR, improved - metoprolol increased to 25mg  po bid.   Holding anticoagulation due to risk of vaginal bleeding.  Echocardiogram showed ejection fraction 50-55% -Follow up outpatient.   Recent vaginal delivery complicated by fourth degree vaginal  laceration Concerns for sepsis due to chorioamnionitis - Laceration has been treated by OB/GYN.  Received 48 hrs of zosyn in the hospital. Ok to stop prior to discharge today per Ob/gyn  Insulin-dependent diabetes mellitus type 2 - resume home meds   Essential hypertension -Continue metoprolol 25 mg twice daily   Discharge Instructions   Allergies as of 03/19/2018   No Known Allergies     Medication List    STOP taking these medications   NIFEdipine 30 MG 24 hr tablet Commonly known as:  PROCARDIA-XL/NIFEDICAL-XL   progesterone 200 MG capsule Commonly known as:  PROMETRIUM     TAKE these medications   docusate sodium 100 MG capsule Commonly known as:  COLACE Take 1 capsule (100 mg total) by mouth 2 (two) times daily.   glyBURIDE 5 MG tablet Commonly known as:  DIABETA Take 2 tablets (10 mg total) by mouth at bedtime.   insulin glargine 100 UNIT/ML injection Commonly known as:  LANTUS Inject 0.26 mLs (26 Units total) into the skin daily. What changed:  when to take this   metFORMIN 1000 MG tablet Commonly known as:  GLUCOPHAGE Take 1 tablet (1,000 mg total) by mouth 2 (two) times daily with a meal.   metoprolol tartrate 25 MG tablet Commonly known as:  LOPRESSOR Take 1 tablet (25 mg total) by mouth 2 (two) times daily for 30 days.   PNV PRENATAL PLUS MULTIVITAMIN 27-1 MG Tabs Take 1 tablet by mouth daily.      Follow-up Information    [facility] Follow up.   Specialty:  Cardiology Why:  You have a hospital follow-up scheduled for 04/06/2018 at 3:30pm with  Corine Shelter, one of our office's physician assistance. Please arrive 15 minutes early for recheck in.  Contact information: [address] [place] [address] [phone]       [facility] Follow up in 4 week(s).   Specialty:  Obstetrics and Gynecology Contact information: [address] [place] [address] [phone]          No Known Allergies  You were cared for by a hospitalist during your hospital stay. If you have any questions about your discharge medications or the care you received while you were in the hospital after you are discharged, you can call the unit and asked to speak with the hospitalist on call if the hospitalist that took care of you is not available. Once you are discharged, your primary care physician will handle any further medical issues. Please note that no refills for any discharge medications will be authorized once you are discharged, as it is imperative that you return to your primary care physician (or establish a relationship with a primary care physician if you do not have one) for your aftercare needs so that they can reassess your need for medications and monitor your lab values.  Consultations:  Cardiology  Ob   Procedures/Studies: Ct Angio Chest Pe W Or Wo Contrast  Result Date: 03/18/2018 CLINICAL DATA:  Status post vaginal delivery. Atrial fibrillation. Elevated D-dimer. EXAM: CT ANGIOGRAPHY CHEST WITH CONTRAST TECHNIQUE: Multidetector CT imaging of the chest was performed using the standard protocol during bolus administration of intravenous contrast. Multiplanar CT image reconstructions and MIPs were obtained to evaluate the vascular anatomy. CONTRAST:  ISOVUE-370 IOPAMIDOL (ISOVUE-370) INJECTION 76% COMPARISON:  None. FINDINGS: Cardiovascular: No filling defects in the pulmonary arteries to suggest pulmonary emboli. Heart is mildly enlarged. Aorta is normal caliber. Mediastinum/Nodes: No mediastinal, hilar, or axillary adenopathy. Lungs/Pleura: Low lung volumes. Ground-glass opacities in the lungs could reflect atelectasis or edema. No effusions. Upper Abdomen: Imaging into the upper abdomen shows no acute findings. Musculoskeletal: Chest wall soft tissues are unremarkable. No acute bony abnormality. Review of the MIP images confirms the above findings. IMPRESSION: Mild  cardiomegaly, likely related to postpartum state. Ground-glass opacities within the lungs could reflect atelectasis or edema. No evidence of pulmonary embolus. Electronically Signed   By: Charlett Nose M.D.   On: 03/18/2018 00:33   US Ob Follow Up  Result Date: 03/17/2018 FOLLOW UP SONOGRAM Ghazal Pevey is in the office for a follow up sonogram for EFW/BPP. She is a 33 y.o. year old G1P0000 with Estimated Date of Delivery: 03/29/18 by early ultrasound now at  [redacted]w[redacted]d weeks gestation. Thus far the pregnancy has been complicated by Type 2 diabetes. GESTATION: SINGLETON PRESENTATION: cephalic FETAL ACTIVITY:          Heart rate         150          The fetus is active. AMNIOTIC FLUID: The amniotic fluid volume is  normal, 9 cm. PLACENTA LOCALIZATION:  anterior GRADE 3 CERVIX: Limited view GESTATIONAL AGE AND  BIOMETRICS: Gestational criteria: Estimated Date of Delivery: 03/29/18 by early ultrasound now at [redacted]w[redacted]d Previous Scans:9          BIPARIETAL DIAMETER           8.51 cm         34+2 weeks    14% HEAD CIRCUMFERENCE           30.88 cm  34+3 weeks    2.8% ABDOMINAL CIRCUMFERENCE           36.43 cm         40+2 weeks   99% FEMUR LENGTH           7.01 cm         35+6 weeks   44%                                                       AVERAGE EGA(BY THIS SCAN):  36+1 weeks                                                 ESTIMATED FETAL WEIGHT:       3349  grams, 92 % BIOPHYSICAL PROFILE:                                                                                                      COMMENTS GROSS BODY MOVEMENT                 2  TONE                2  RESPIRATIONS                2  AMNIOTIC FLUID                2                                                          SCORE:  8/8 (Note: NST was not performed as part of this antepartum testing) ANATOMICAL SURVEY                                                                            COMMENTS CEREBRAL VENTRICLES yes normal  CHOROID PLEXUS yes normal                           FACIAL PROFILE yes normal  4 CHAMBERED HEART yes normal Limited view      DIAPHRAGM yes normal  STOMACH yes normal  RENAL REGION yes normal  BLADDER yes normal      3 VESSEL CORD yes normal  GENITALIA   female     SUSPECTED ABNORMALITIES:  EFW 92%,AC 99% QUALITY OF SCAN: Limited view TECHNICIAN COMMENTS: [place] 36 wks,cephalic,fhr 150 bpm,BPP 8/8,anterior placenta gr 3,afi 9 cm,BPP 8/8,EFW 3349 g 92%,AC 99%,limited head measurement because of fetal position A copy of this report including all images has been saved and backed up to a second source for retrieval if needed. All measures and details of the anatomical scan, placentation, fluid volume and pelvic anatomy are contained in that report. Amber Flora Lipps 03/01/2018 12:38 PM Clinical Impression and recommendations: I have reviewed the sonogram results above, combined with the patient's current clinical course, below are my impressions and any appropriate recommendations for management based on the sonographic findings. 1.  G1P1000 Estimated Date of Delivery: 03/29/18 by serial sonographic evaluations 2.  Fetal sonographic surveillance findings: a). Normal fluid volume b). Normal antepartum fetal assessment with BPP 8/8 c). EFW 92%, extrapolates to 4100 grams at 39 weeks 3.  Normal general sonographic findings Recommend continued prenatal evaluations and care based on this sonogram and as clinically indicated from the patient's clinical course. Amaryllis Dyke Eure   US Fetal Bpp Wo Non Stress  Result Date: 03/17/2018 FOLLOW UP SONOGRAM Triva Hueber is in the office for a follow up sonogram for EFW/BPP. She is a 33 y.o. year old G1P0000 with Estimated Date of Delivery: 03/29/18 by early ultrasound now at  [redacted]w[redacted]d weeks gestation. Thus far the pregnancy has been complicated by Type 2 diabetes. GESTATION: SINGLETON PRESENTATION: cephalic FETAL ACTIVITY:          Heart rate         150          The fetus is active. AMNIOTIC FLUID: The amniotic fluid  volume is  normal, 9 cm. PLACENTA LOCALIZATION:  anterior GRADE 3 CERVIX: Limited view GESTATIONAL AGE AND  BIOMETRICS: Gestational criteria: Estimated Date of Delivery: 03/29/18 by early ultrasound now at [redacted]w[redacted]d Previous Scans:9          BIPARIETAL DIAMETER           8.51 cm         34+2 weeks    14% HEAD CIRCUMFERENCE           30.88 cm         34+3 weeks    2.8% ABDOMINAL CIRCUMFERENCE           36.43 cm         40+2 weeks   99% FEMUR LENGTH           7.01 cm         35+6 weeks   44%                                                       AVERAGE EGA(BY THIS SCAN):  36+1 weeks                                                 ESTIMATED FETAL WEIGHT:       3349  grams, 92 % BIOPHYSICAL PROFILE:  COMMENTS GROSS BODY MOVEMENT                 2  TONE                2  RESPIRATIONS                2  AMNIOTIC FLUID                2                                                          SCORE:  8/8 (Note: NST was not performed as part of this antepartum testing) ANATOMICAL SURVEY                                                                            COMMENTS CEREBRAL VENTRICLES yes normal  CHOROID PLEXUS yes normal                          FACIAL PROFILE yes normal  4 CHAMBERED HEART yes normal Limited view      DIAPHRAGM yes normal  STOMACH yes normal  RENAL REGION yes normal  BLADDER yes normal      3 VESSEL CORD yes normal              GENITALIA   female     SUSPECTED ABNORMALITIES:  EFW 92%,AC 99% QUALITY OF SCAN: Limited view TECHNICIAN COMMENTS: [place] 36 wks,cephalic,fhr 150 bpm,BPP 8/8,anterior placenta gr 3,afi 9 cm,BPP 8/8,EFW 3349 g 92%,AC 99%,limited head measurement because of fetal position A copy of this report including all images has been saved and backed up to a second source for retrieval if needed. All measures and details of the anatomical scan, placentation, fluid volume and pelvic anatomy are contained  in that report. Amber Flora Lipps 03/01/2018 12:38 PM Clinical Impression and recommendations: I have reviewed the sonogram results above, combined with the patient's current clinical course, below are my impressions and any appropriate recommendations for management based on the sonographic findings. 1.  G1P1000 Estimated Date of Delivery: 03/29/18 by serial sonographic evaluations 2.  Fetal sonographic surveillance findings: a). Normal fluid volume b). Normal antepartum fetal assessment with BPP 8/8 c). EFW 92%, extrapolates to 4100 grams at 39 weeks 3.  Normal general sonographic findings Recommend continued prenatal evaluations and care based on this sonogram and as clinically indicated from the patient's clinical course. Amaryllis Dyke Eure   US Fetal Bpp Wo Non Stress  Result Date: 02/23/2018 FOLLOW UP SONOGRAM Shannen Staples is in the office for a follow up sonogram for BPP. She is a 33 y.o. year old G1P0000 with Estimated Date of Delivery: 03/29/18 by early ultrasound now at  [redacted]w[redacted]d weeks gestation. Thus far the pregnancy has been complicated by TYPE 2 diabetes. GESTATION:SINGLETON PRESENTATION:cephalic FETAL ACTIVITY:          Heart rate         146  The fetus is active. AMNIOTIC FLUID: The amniotic fluid volume is  normal, 12 cm. PLACENTA LOCALIZATION:  anterior GRADE 2 CERVIX: Limited view Previous Scans:8 BIOPHYSICAL PROFILE:                                                                                                      COMMENTS GROSS BODY MOVEMENT                2  TONE                2  RESPIRATIONS                2  AMNIOTIC FLUID                2                           SCORE:  8/8 (Note: NST was not performed as part of this antepartum testing)  ANATOMICAL SURVEY                                                                                                                         COMMENTS CEREBRAL VENTRICLES yes normal  CHOROID PLEXUS yes normal                          FACIAL PROFILE yes normal   4 CHAMBERED HEART yes normal  OUTFLOW TRACTS yes normal  DIAPHRAGM yes normal  STOMACH yes normal  RENAL REGION yes normal  BLADDER yes normal      3 VESSEL CORD yes normal              GENITALIA yes normal female     SUSPECTED ABNORMALITIES:  no QUALITY OF SCAN: satisfactory TECHNICIAN COMMENTS: [place] 35 wks,cephalic,anterior placenta gr 2,afi 12 cm,fhr 146 bpm,BPP 8/8 A copy of this report including all images has been saved and backed up to a second source for retrieval if needed. All measures and details of the anatomical scan, placentation, fluid volume and pelvic anatomy are contained in that report. Amber Flora Lipps 02/22/2018 11:59 AM Clinical Impression and recommendations: I have reviewed the sonogram results above. Combined with the patient's current clinical course, below are my impressions and any appropriate recommendations for management based on the sonographic findings: 1. Fetal wellbeing suggested by BPP 8/8. 2. Singleton cephalic presentation at 35 wk. 3. Limited cervix view but was 2 cm dilated at recent hospitalization. Will continue weekly Bpp or twice weekly  NST, Growth u/w next week. Tilda Burrow, MD      Subjective:No complaints, feels better.  Wants to go home so she can be with her baby today   Discharge Exam: Vitals:   03/18/18 2328 03/19/18 0608  BP:  130/79  Pulse: 74   Resp:    Temp:  98.3 F (36.8 C)  SpO2:     Vitals:   03/18/18 1100 03/18/18 2136 03/18/18 2328 03/19/18 0608  BP: 123/86 105/64  130/79  Pulse: 78 71 74   Resp: 16 (!) 26    Temp: 98.4 F (36.9 C)   98.3 F (36.8 C)  TempSrc:    Oral  SpO2: 100% 97%    Weight:    89.3 kg  Height:        General: Pt is alert, awake, not in acute distress Cardiovascular: RRR, S1/S2 +, no rubs, no gallops Respiratory: CTA bilaterally, no wheezing, no rhonchi Abdominal: Soft, NT, ND, bowel sounds + Extremities: no edema, no cyanosis    The results of significant diagnostics from this hospitalization  (including imaging, microbiology, ancillary and laboratory) are listed below for reference.     Microbiology: No results found for this or any previous visit (from the past 240 hour(s)).   Labs: BNP (last 3 results) No results for input(s): BNP in the last 8760 hours. Basic Metabolic Panel: Recent Labs  Lab 03/17/18 0211 03/17/18 2207 03/18/18 0335 03/19/18 0326  NA 133* 135 139 138  K 3.8 3.8 4.0 3.8  CL 108 106 110 107  CO2 19* 18* 18* 22  GLUCOSE 82 214* 101* 97  BUN 12 7 6 14   CREATININE 0.65 0.84 0.77 0.94  CALCIUM 8.4* 8.3* 8.5* 8.5*  MG  --  1.7 1.8 2.0  PHOS  --  3.6  --   --    Liver Function Tests: Recent Labs  Lab 03/17/18 0211 03/17/18 2207  AST 19 47*  ALT 13 19  ALKPHOS 144* 107  BILITOT 0.3 0.3  PROT 6.3* 5.0*  ALBUMIN 2.6* 1.9*   No results for input(s): LIPASE, AMYLASE in the last 168 hours. No results for input(s): AMMONIA in the last 168 hours. CBC: Recent Labs  Lab 03/17/18 0211 03/17/18 2207 03/18/18 0335 03/19/18 0326  WBC 11.9* 27.9* 24.3* 16.5*  NEUTROABS  --  24.8*  --   --   HGB 11.1* 10.5* 10.6* 10.0*  HCT 33.8* 31.6* 32.1* 31.2*  MCV 87.1 84.3 84.5 86.9  PLT 129* 121* 122* 114*   Cardiac Enzymes: Recent Labs  Lab 03/17/18 2207 03/18/18 0335  TROPONINI 0.07* 0.04*   BNP: Invalid input(s): POCBNP CBG: Recent Labs  Lab 03/18/18 1216 03/18/18 1821 03/18/18 2135 03/19/18 0754 03/19/18 1115  GLUCAP 163* 157* 143* 149* 112*   D-Dimer Recent Labs    03/17/18 2207  DDIMER 15.97*   Hgb A1c No results for input(s): HGBA1C in the last 72 hours. Lipid Profile No results for input(s): CHOL, HDL, LDLCALC, TRIG, CHOLHDL, LDLDIRECT in the last 72 hours. Thyroid function studies Recent Labs    03/17/18 2207  TSH 1.426   Anemia work up No results for input(s): VITAMINB12, FOLATE, FERRITIN, TIBC, IRON, RETICCTPCT in the last 72 hours. Urinalysis    Component Value Date/Time   COLORURINE STRAW (A) 03/17/2018 2154    APPEARANCEUR CLEAR 03/17/2018 2154   APPEARANCEUR Clear 09/02/2017 1430   LABSPEC 1.024 03/17/2018 2154   PHURINE 6.0 03/17/2018 2154   GLUCOSEU NEGATIVE 03/17/2018 2154   HGBUR SMALL (  A) 03/17/2018 2154   BILIRUBINUR NEGATIVE 03/17/2018 2154   BILIRUBINUR Negative 09/02/2017 1430   KETONESUR NEGATIVE 03/17/2018 2154   PROTEINUR NEGATIVE 03/17/2018 2154   NITRITE NEGATIVE 03/17/2018 2154   LEUKOCYTESUR NEGATIVE 03/17/2018 2154   Sepsis Labs Invalid input(s): PROCALCITONIN,  WBC,  LACTICIDVEN Microbiology No results found for this or any previous visit (from the past 240 hour(s)).   Time coordinating discharge:  I have spent 35 minutes face to face with the patient and on the ward discussing the patients care, assessment, plan and disposition with other care givers. >50% of the time was devoted counseling the patient about the risks and benefits of treatment/Discharge disposition and coordinating care.   SIGNED:   Dimple Nanas, MD  [facility] 03/19/2018, 4:31 PM   If 7PM-7AM, please contact night-coverage [email or web address]

## 2018-03-19 NOTE — Significant Event (Signed)
Rapid Response Event Note  Overview: SVT HR > 250  Initial Focused Assessment: Called by 6E RN about patient's HR being in the > 250, per RN, Patient is awake but symptomatic. Upon arrival, patient was alert, oriented, mildly diaphoretic, + palpitations, and was lightheaded. HR was in the 250-270 range, narrow complex tachycardia. SBP > 110 and MAP > 70. Within less than a minute of my initial arrival, HR improved into ST in the 110-120s range. Patient endorses that she felt better. RN had already paged CARDS MD on call. I walked to the nursing station to review the telemetry and another episode happened again, lasting for several minutes.   Interventions: -- Vagal Maneuvers -- Carotid Massage  -- RN gave scheduled dose of Metoprolol 25mg  PO as well. -- MD ordered Metoprolol 10 mg IV x 1. This was given over several minutes and HR improved into SR in the 90s.   Plan of Care: -- RN to monitor HR and BP -- I was called away to another emergency, I called back for update x 2, HR maintaining in the 80s and MD ordered Flecainide 50 mg  Event Summary:    at    Call Time 2016 Arrival Time 2018 End Time 2100  Jessica Carey R

## 2018-03-19 NOTE — Progress Notes (Signed)
After discharge order was placed, pt noted to have frequent non-sustained and sustained SVTs.  Also noted to have frequent bigeminy and irregular rhythm.  Pt stated she felt palpitations with SVT no SOB.  O2 sat 100% on RA.  Pt stated when she became emotional, her HR went up.  Dr. Janey Genta and oncall cardiology made aware.  Received verbal order from Dr. Nelson Chimes to discontinue her discharge order.  Hinton Dyer, RN

## 2018-03-19 NOTE — Progress Notes (Signed)
PROGRESS NOTE    Jessica Carey  ZOX:[id number] DOB: 04/19/1985 DOA: 03/17/2018 PCP: Patient, No Pcp Per   Brief Narrative:  33 year old G1, P1 status post delivery on 2/20 ended up causing fourth degree laceration which was treated.  Her delivery course was complicated by atrial flutter.  She was started on Cardizem drip and [facility] was [facility].  Cardiology team was consulted.  Echocardiogram showed ejection fraction 55 to 60% without significant wall motion abnormality.  Metoprolol 12.5 mg twice daily was added.  No anticoagulation due to vaginal bleeding risk but this will be revisited later in the future.  Currently on Zosyn per OB/GYN.   Assessment & Plan:   Active Problems:   Atrial fibrillation (HCC)   Gestational hypertension   Paroxysmal A-fib (HCC)   Fourth degree perineal laceration   Shoulder dystocia, delivered   Gestational diabetes mellitus (GDM) controlled on oral hypoglycemic drug, antepartum  Paroxysmal atrial fibrillation with atrial flutter and RVR, improved - Currently patient is on metoprolol 12.5 mg twice daily.  Cardizem drip to be weaned off.  Maintain potassium and magnesium above 4 and above 2 respectively.  Holding anticoagulation due to risk of vaginal bleeding.  Echocardiogram showed ejection fraction 50-55% -Metoprolol 12.5 mg twice daily has been started.  Recent vaginal delivery complicated by fourth degree vaginal laceration Concerns for sepsis due to chorioamnionitis - Laceration has been treated by OB/GYN.  Plans to continue Zosyn for now.  Provide supportive care.  Insulin-dependent diabetes mellitus type 2 - Continue current insulin regimen.  Lantus 15 units daily, 2 units NovoLog pre-meals.  Insulin sliding scale.  Essential hypertension -Continue metoprolol 12.5 mg twice daily   DVT prophylaxis: SCDs Code Status: Full code Family Communication: Family at bedside Disposition Plan: Maintain inpatient  stay for now for closer monitoring.  Consultants:   Cardiology  OB/GYN  Procedures-repair of the 4 degree perineal laceration 2/12  Antimicrobials:   Zosyn 2/12>   Subjective: Feels better, no complaints at this time.  Off-and-on still notices slight bleeding.  Review of Systems Otherwise negative except as per HPI, including: General = no fevers, chills, dizziness, malaise, fatigue HEENT/EYES = negative for pain, redness, loss of vision, double vision, blurred vision, loss of hearing, sore throat, hoarseness, dysphagia Cardiovascular= negative for chest pain, palpitation, murmurs, lower extremity swelling Respiratory/lungs= negative for shortness of breath, cough, hemoptysis, wheezing, mucus production Gastrointestinal= negative for nausea, vomiting,, abdominal pain, melena, hematemesis Genitourinary= negative for Dysuria, Hematuria, Change in Urinary Frequency MSK = Negative for arthralgia, myalgias, Back Pain, Joint swelling  Neurology= Negative for headache, seizures, numbness, tingling  Psychiatry= Negative for anxiety, depression, suicidal and homocidal ideation Allergy/Immunology= Medication/Food allergy as listed  Skin= Negative for Rash, lesions, ulcers, itching  Objective: Vitals:   03/18/18 1100 03/18/18 2136 03/18/18 2328 03/19/18 0608  BP: 123/86 105/64  130/79  Pulse: 78 71 74   Resp: 16 (!) 26    Temp: 98.4 F (36.9 C)   98.3 F (36.8 C)  TempSrc:    Oral  SpO2: 100% 97%    Weight:    89.3 kg  Height:        Intake/Output Summary (Last 24 hours) at 03/19/2018 1050 Last data filed at 03/19/2018 [id number] Gross per 24 hour  Intake 943 ml  Output 1050 ml  Net -107 ml   Filed Weights   03/17/18 0819 03/18/18 0500 03/19/18 0608  Weight: 97.8 kg 94.9 kg 89.3 kg    Examination:  General  exam: Appears calm and comfortable  Respiratory system: Clear to auscultation. Respiratory effort normal. Cardiovascular system: S1 & S2 heard, RRR. No JVD, murmurs,  rubs, gallops or clicks. No pedal edema. Gastrointestinal system: Abdomen is nondistended, soft and nontender. No organomegaly or masses felt. Normal bowel sounds heard. Central nervous system: Alert and oriented. No focal neurological deficits. Extremities: Symmetric 5 x 5 power. Skin: No rashes, lesions or ulcers Psychiatry: Judgement and insight appear normal. Mood & affect appropriate.     Data Reviewed:   CBC: Recent Labs  Lab 03/17/18 0211 03/17/18 2207 03/18/18 0335 03/19/18 0326  WBC 11.9* 27.9* 24.3* 16.5*  NEUTROABS  --  24.8*  --   --   HGB 11.1* 10.5* 10.6* 10.0*  HCT 33.8* 31.6* 32.1* 31.2*  MCV 87.1 84.3 84.5 86.9  PLT 129* 121* 122* 114*   Basic Metabolic Panel: Recent Labs  Lab 03/17/18 0211 03/17/18 2207 03/18/18 0335 03/19/18 0326  NA 133* 135 139 138  K 3.8 3.8 4.0 3.8  CL 108 106 110 107  CO2 19* 18* 18* 22  GLUCOSE 82 214* 101* 97  BUN 12 7 6 14   CREATININE 0.65 0.84 0.77 0.94  CALCIUM 8.4* 8.3* 8.5* 8.5*  MG  --  1.7 1.8 2.0  PHOS  --  3.6  --   --    GFR: Estimated Creatinine Clearance: 93.8 mL/min (by C-G formula based on SCr of 0.94 mg/dL). Liver Function Tests: Recent Labs  Lab 03/17/18 0211 03/17/18 2207  AST 19 47*  ALT 13 19  ALKPHOS 144* 107  BILITOT 0.3 0.3  PROT 6.3* 5.0*  ALBUMIN 2.6* 1.9*   No results for input(s): LIPASE, AMYLASE in the last 168 hours. No results for input(s): AMMONIA in the last 168 hours. Coagulation Profile: Recent Labs  Lab 03/17/18 2207  INR 1.02   Cardiac Enzymes: Recent Labs  Lab 03/17/18 2207 03/18/18 0335  TROPONINI 0.07* 0.04*   BNP (last 3 results) No results for input(s): PROBNP in the last 8760 hours. HbA1C: No results for input(s): HGBA1C in the last 72 hours. CBG: Recent Labs  Lab 03/18/18 0132 03/18/18 1216 03/18/18 1821 03/18/18 2135 03/19/18 0754  GLUCAP 128* 163* 157* 143* 149*   Lipid Profile: No results for input(s): CHOL, HDL, LDLCALC, TRIG, CHOLHDL,  LDLDIRECT in the last 72 hours. Thyroid Function Tests: Recent Labs    03/17/18 2207  TSH 1.426   Anemia Panel: No results for input(s): VITAMINB12, FOLATE, FERRITIN, TIBC, IRON, RETICCTPCT in the last 72 hours. Sepsis Labs: No results for input(s): PROCALCITON, LATICACIDVEN in the last 168 hours.  No results found for this or any previous visit (from the past 240 hour(s)).       Radiology Studies: Ct Angio Chest Pe W Or Wo Contrast  Result Date: 03/18/2018 CLINICAL DATA:  Status post vaginal delivery. Atrial fibrillation. Elevated D-dimer. EXAM: CT ANGIOGRAPHY CHEST WITH CONTRAST TECHNIQUE: Multidetector CT imaging of the chest was performed using the standard protocol during bolus administration of intravenous contrast. Multiplanar CT image reconstructions and MIPs were obtained to evaluate the vascular anatomy. CONTRAST:  ISOVUE-370 IOPAMIDOL (ISOVUE-370) INJECTION 76% COMPARISON:  None. FINDINGS: Cardiovascular: No filling defects in the pulmonary arteries to suggest pulmonary emboli. Heart is mildly enlarged. Aorta is normal caliber. Mediastinum/Nodes: No mediastinal, hilar, or axillary adenopathy. Lungs/Pleura: Low lung volumes. Ground-glass opacities in the lungs could reflect atelectasis or edema. No effusions. Upper Abdomen: Imaging into the upper abdomen shows no acute findings. Musculoskeletal: Chest wall soft tissues  are unremarkable. No acute bony abnormality. Review of the MIP images confirms the above findings. IMPRESSION: Mild cardiomegaly, likely related to postpartum state. Ground-glass opacities within the lungs could reflect atelectasis or edema. No evidence of pulmonary embolus. Electronically Signed   By: Charlett Nose M.D.   On: 03/18/2018 00:33        Scheduled Meds: . docusate sodium  100 mg Oral TID  . ibuprofen  600 mg Oral Q6H  . insulin aspart  0-15 Units Subcutaneous TID WC  . insulin aspart  0-5 Units Subcutaneous QHS  . insulin glargine  15 Units  Subcutaneous QHS  . metoprolol tartrate  12.5 mg Oral BID  . prenatal vitamin w/FE, FA  1 tablet Oral Daily  . sodium chloride flush  3 mL Intravenous Q12H   Continuous Infusions: . sodium chloride 10 mL (03/18/18 1649)  . diltiazem (CARDIZEM) infusion 5 mg/hr (03/18/18 2329)  . piperacillin-tazobactam (ZOSYN)  IV 3.375 g (03/19/18 1015)     LOS: 2 days   Time spent= 35 mins    Twanda Stakes Joline Maxcy, MD [facility]  If 7PM-7AM, please contact night-coverage [email or web address] 03/19/2018, 10:50 AM

## 2018-03-19 NOTE — Progress Notes (Signed)
Progress Note  Patient Name: Jessica Carey Date of Encounter: 03/19/2018  Primary Cardiologist: Dina Rich, MD (prefers to follow up in [facility])  Subjective   Feeling well.  No chest pain or palpitations.  Breathing stable.   Inpatient Medications    Scheduled Meds: . docusate sodium  100 mg Oral TID  . ibuprofen  600 mg Oral Q6H  . insulin aspart  0-15 Units Subcutaneous TID WC  . insulin aspart  0-5 Units Subcutaneous QHS  . insulin glargine  15 Units Subcutaneous QHS  . metoprolol tartrate  25 mg Oral BID  . prenatal vitamin w/FE, FA  1 tablet Oral Daily  . sodium chloride flush  3 mL Intravenous Q12H   Continuous Infusions: . sodium chloride 10 mL (03/18/18 1649)  . piperacillin-tazobactam (ZOSYN)  IV 3.375 g (03/19/18 1015)   PRN Meds: sodium chloride, acetaminophen, oxyCODONE   Vital Signs    Vitals:   03/18/18 1100 03/18/18 2136 03/18/18 2328 03/19/18 0608  BP: 123/86 105/64  130/79  Pulse: 78 71 74   Resp: 16 (!) 26    Temp: 98.4 F (36.9 C)   98.3 F (36.8 C)  TempSrc:    Oral  SpO2: 100% 97%    Weight:    89.3 kg  Height:        Intake/Output Summary (Last 24 hours) at 03/19/2018 1433 Last data filed at 03/19/2018 9326 Gross per 24 hour  Intake 703 ml  Output -  Net 703 ml   Last 3 Weights 03/19/2018 03/18/2018 03/17/2018  Weight (lbs) 196 lb 14.4 oz 209 lb 3.5 oz 215 lb 9.8 oz  Weight (kg) 89.313 kg 94.9 kg 97.8 kg      Telemetry    Sinus rhythm.  PACs, PVC.  Ventricular bigeminy. - Personally Reviewed  ECG    N/a  - Personally Reviewed  Physical Exam   VS:  BP 130/79 (BP Location: Right Arm)   Pulse 74   Temp 98.3 F (36.8 C) (Oral)   Resp (!) 26   Ht 5\' 4"  (1.626 m)   Wt 89.3 kg   LMP 06/10/2017   SpO2 97%   Breastfeeding Unknown   BMI 33.80 kg/m  , BMI Body mass index is 33.8 kg/m. GENERAL:  Well appearing HEENT: Pupils equal round and reactive, fundi not visualized, oral mucosa unremarkable NECK:  No jugular  venous distention, waveform within normal limits, carotid upstroke brisk LYMPHATICS:  No cervical adenopathy LUNGS:  Clear to auscultation bilaterally HEART:  RRR.  PMI not displaced or sustained,S1 and S2 within normal limits, no S3, no S4, no clicks, no rubs, no murmurs ABD:  Flat, positive bowel sounds normal in frequency in pitch, no bruits, no rebound, no guarding, no midline pulsatile mass, no hepatomegaly, no splenomegaly EXT:  2 plus pulses throughout, no edema, no cyanosis no clubbing SKIN:  No rashes no nodules NEURO:  Cranial nerves II through XII grossly intact, motor grossly intact throughout [facility]:  Cognitively intact, oriented to person place and time   Labs    Chemistry Recent Labs  Lab 03/17/18 0211 03/17/18 2207 03/18/18 0335 03/19/18 0326  NA 133* 135 139 138  K 3.8 3.8 4.0 3.8  CL 108 106 110 107  CO2 19* 18* 18* 22  GLUCOSE 82 214* 101* 97  BUN 12 7 6 14   CREATININE 0.65 0.84 0.77 0.94  CALCIUM 8.4* 8.3* 8.5* 8.5*  PROT 6.3* 5.0*  --   --   ALBUMIN 2.6* 1.9*  --   --  AST 19 47*  --   --   ALT 13 19  --   --   ALKPHOS 144* 107  --   --   BILITOT 0.3 0.3  --   --   GFRNONAA >60 >60 >60 >60  GFRAA >60 >60 >60 >60  ANIONGAP 6 11 11 9      Hematology Recent Labs  Lab 03/17/18 2207 03/18/18 0335 03/19/18 0326  WBC 27.9* 24.3* 16.5*  RBC 3.75* 3.80* 3.59*  HGB 10.5* 10.6* 10.0*  HCT 31.6* 32.1* 31.2*  MCV 84.3 84.5 86.9  MCH 28.0 27.9 27.9  MCHC 33.2 33.0 32.1  RDW 13.6 13.6 13.9  PLT 121* 122* 114*    Cardiac Enzymes Recent Labs  Lab 03/17/18 2207 03/18/18 0335  TROPONINI 0.07* 0.04*   No results for input(s): TROPIPOC in the last 168 hours.   BNPNo results for input(s): BNP, PROBNP in the last 168 hours.   DDimer  Recent Labs  Lab 03/17/18 2207  DDIMER 15.97*     Radiology    Ct Angio Chest Pe W Or Wo Contrast  Result Date: 03/18/2018 CLINICAL DATA:  Status post vaginal delivery. Atrial fibrillation. Elevated D-dimer. EXAM:  CT ANGIOGRAPHY CHEST WITH CONTRAST TECHNIQUE: Multidetector CT imaging of the chest was performed using the standard protocol during bolus administration of intravenous contrast. Multiplanar CT image reconstructions and MIPs were obtained to evaluate the vascular anatomy. CONTRAST:  ISOVUE-370 IOPAMIDOL (ISOVUE-370) INJECTION 76% COMPARISON:  None. FINDINGS: Cardiovascular: No filling defects in the pulmonary arteries to suggest pulmonary emboli. Heart is mildly enlarged. Aorta is normal caliber. Mediastinum/Nodes: No mediastinal, hilar, or axillary adenopathy. Lungs/Pleura: Low lung volumes. Ground-glass opacities in the lungs could reflect atelectasis or edema. No effusions. Upper Abdomen: Imaging into the upper abdomen shows no acute findings. Musculoskeletal: Chest wall soft tissues are unremarkable. No acute bony abnormality. Review of the MIP images confirms the above findings. IMPRESSION: Mild cardiomegaly, likely related to postpartum state. Ground-glass opacities within the lungs could reflect atelectasis or edema. No evidence of pulmonary embolus. Electronically Signed   By: Charlett Nose M.D.   On: 03/18/2018 00:33    Cardiac Studies   Echo 03/18/18: IMPRESSIONS    1. The left ventricle has low normal systolic function, with an ejection fraction of 50-55%. The cavity size was normal. There is mildly increased left ventricular wall thickness. Left ventricular diastolic Doppler parameters are consistent with  pseudonormalization.  2. The right ventricle has normal systolic function. The cavity was normal. There is no increase in right ventricular wall thickness.  3. Right atrial pressure is estimated at 8 mmHg.  4. Left atrial size was moderately dilated.  5. The mitral valve is normal in structure. There is mild thickening.  6. The tricuspid valve is normal in structure.  7. The aortic valve is tricuspid.  8. The pulmonic valve was normal in structure.  9. The inferior vena cava was  dilated in size with >50% respiratory variability. 10. Extremely limited; Low normal LV systolic function; focal wall motion abnormality cannot be excluded; moderate diastolic dysfunction; mild LVH; moderate LAE.  Patient Profile     Ms. Nesta is a 35F with paroxysmal atrial flutter, gestational hypertension and gestational diabetes who is one day post-partum and developed recurrent atrial fibrillation with RVR.    Assessment & Plan    # Paroxysmal atrial fibrillation: # PACs: # PVCs: Currently maintaining sinus rhythm with PACs and PVCs.  No recurrent atrial fibrillation.  We will increase metoprolol to  25mg  bid.  Echo shows stable systolic function.  Will consider anticoagulation if she continues to have episodes and if her DM/Hypertension are persistent post delivery.  OK for discharge from a cardiology stand piont.  We will arrange follow up.  # Gestational hypertension:  BP controlled on metoprolol.  Increasing the dose as above.      For questions or updates, please contact [facility] Please consult [email or web address] for contact info under        Signed, Chilton Siiffany Kangley, MD  03/19/2018, 2:33 PM

## 2018-03-20 DIAGNOSIS — I471 Supraventricular tachycardia: Secondary | ICD-10-CM

## 2018-03-20 LAB — GLUCOSE, CAPILLARY
Glucose-Capillary: 83 mg/dL (ref 70–99)
Glucose-Capillary: 143 mg/dL — ABNORMAL HIGH (ref 70–99)
Glucose-Capillary: 106 mg/dL — ABNORMAL HIGH (ref 70–99)
Glucose-Capillary: 143 mg/dL — ABNORMAL HIGH (ref 70–99)

## 2018-03-20 MED ORDER — METOPROLOL TARTRATE 25 MG PO TABS
25.0000 mg | ORAL_TABLET | Freq: Once | ORAL | Status: AC
  Administered 2018-03-20: 25 mg via ORAL
  Filled 2018-03-20: qty 1

## 2018-03-20 MED ORDER — HYDROXYZINE HCL 25 MG PO TABS
25.0000 mg | ORAL_TABLET | Freq: Three times a day (TID) | ORAL | Status: DC | PRN
  Administered 2018-03-20: 25 mg via ORAL
  Filled 2018-03-20 (×3): qty 1

## 2018-03-20 MED ORDER — METOPROLOL TARTRATE 50 MG PO TABS
50.0000 mg | ORAL_TABLET | Freq: Two times a day (BID) | ORAL | Status: DC
  Administered 2018-03-20 – 2018-03-21 (×3): 50 mg via ORAL
  Filled 2018-03-20 (×3): qty 1

## 2018-03-20 NOTE — Progress Notes (Signed)
PROGRESS NOTE  Jessica Carey  UJW:[id number] DOB: 08/08/1985 DOA: 03/17/2018 PCP: Patient, No Pcp Per  Brief Narrative: Jessica Carey is a 33 y.o. G1P1 s/p NSVD at 6976w2d on 2/12 complicated by pre-E, shoulder dystocia and 4th degree perineal laceration, with a history of PAF and type 2 diabetes mellitus.  Labor and delivery was complicated by fever for which ampicillin was given and rapid A Flutter. Cardiology was consulted and diltiazem gtt started, patient transferred postpartum following operative repair of laceration to [facility]. Zosyn was started given severity of laceration. Baby girl had APGARs 1, 3, transferred to NICU. D-dimer noted to be elevated and subsequent CTA chest showed no PE, mild cardiomegaly, and pulmonary GGOs, nonspecific.   03/20/2018: Patient went into SVT early hours of this morning, with heart rate close to 250 bpm.  Patient is currently on flecainide and metoprolol.  Cardiology is directing care.  Possible ablation on Monday.  Will defer further work-up to cardiology team.  Assessment & Plan: Active Problems:   Atrial fibrillation (HCC)   Gestational hypertension   Paroxysmal A-fib (HCC)   Fourth degree perineal laceration   Shoulder dystocia, delivered   Gestational diabetes mellitus (GDM) controlled on oral hypoglycemic drug, antepartum  Supraventricular tachycardia/paroxysmal atrial fibrillation/atrial flutter with RVR:  Had not taken labetalol as prescribed by cardiology due to concerns for fetotoxicity. RVR precipitated by traumatic delivery, sepsis due to possible chorioamnionitis. TSH 1.426 - Continue diltiazem gtt, rate labile this morning, but improving on 5mg /hr. - Keep K >4 and Mg >2, give Mg now - Cardiology consulted for assistance in management - CHA2DS2-VASc score is 2. Holding anticoagulation with recent laceration repair.  - Echocardiogram pending 03/20/2018: Patient went into SVT early hours of this morning, with heart rate close to 250 bpm.  Patient is  currently on flecainide and metoprolol.  Cardiology is directing care.  Possible ablation on Monday.  Will defer further work-up to cardiology team.  Fourth degree perineal tear: -Completed course of IV Zosyn as suggested by obstetrics and gynecology team.  Diabetes mellitus type 2: - Continue basal-bolus insulin. Hold OSU and cover with SSI instead. Decreased lantus dose initially. CBG at inpatient goal so will not change, but continue AC/HS monitoring. - Carb-modified diet 03/20/2018: Continue to optimize.  Patient is on subcutaneous Lantus insulin 15 units at nighttime, as well as NovoLog insulin 2 units 3 times daily AC.  Blood sugar is well controlled.  HTN:  - Continue monitoring with rate control agents. 03/20/2018: Continue to optimize.  Troponin elevation:  Mild, downward trending consistent with demand ischemia.  - Echo pending. Further interventions/work up per cardiology 03/20/2018: Echocardiogram revealed "Low normal LV systolic function; focal wall motion abnormality cannot be excluded; moderate diastolic dysfunction; mild LVH; moderate LAE".  EF is estimated at 50 to 55%.  Troponin trended downwards.  Likely type II elevation from rapid heart beats.  Elevated d-dimer:  Negative CTA PE study.  DVT prophylaxis: SCDs Code Status: Full Family Communication:  Disposition Plan: Home once work up complete, cardiac status stabilizes, and cleared by OB.  Consultants:   OB/GYN  Cardiology  Procedures:   Operative repair of fourth degree perineal laceration 03/17/2018 by Dr. Macon LargeAnyanwu.  Antimicrobials:  Zosyn 2/12 >> discontinued.  Subjective: No new complaints. No fever or chills.  Objective: Vitals:   03/20/18 0208 03/20/18 0214 03/20/18 0521 03/20/18 1347  BP: (!) 152/94 (!) 141/93 134/88 123/73  Pulse: 74 82 80 77  Resp: 18 18 (!) 9   Temp:   99.3  F (37.4 C) 98.3 F (36.8 C)  TempSrc:   Oral Oral  SpO2: 96% 96% 92% 98%  Weight:   87.3 kg   Height:         Intake/Output Summary (Last 24 hours) at 03/20/2018 1700 Last data filed at 03/20/2018 0630 Gross per 24 hour  Intake 263.35 ml  Output -  Net 263.35 ml   Filed Weights   03/18/18 0500 03/19/18 0608 03/20/18 0521  Weight: 94.9 kg 89.3 kg 87.3 kg    Gen: 32 y.o. female in no distress  Pulm: Non-labored breathing room air. Clear to auscultation laterally.  CV: S1-S2  GI: Abdomen is obese, soft nontender. Ext: Mild edema of the ankle and the hands.   Neuro: Alert and oriented.  Patient moves all extremities.  Data Reviewed: I have personally reviewed following labs and imaging studies  CBC: Recent Labs  Lab 03/17/18 0211 03/17/18 2207 03/18/18 0335 03/19/18 0326  WBC 11.9* 27.9* 24.3* 16.5*  NEUTROABS  --  24.8*  --   --   HGB 11.1* 10.5* 10.6* 10.0*  HCT 33.8* 31.6* 32.1* 31.2*  MCV 87.1 84.3 84.5 86.9  PLT 129* 121* 122* 114*   Basic Metabolic Panel: Recent Labs  Lab 03/17/18 0211 03/17/18 2207 03/18/18 0335 03/19/18 0326  NA 133* 135 139 138  K 3.8 3.8 4.0 3.8  CL 108 106 110 107  CO2 19* 18* 18* 22  GLUCOSE 82 214* 101* 97  BUN 12 7 6 14   CREATININE 0.65 0.84 0.77 0.94  CALCIUM 8.4* 8.3* 8.5* 8.5*  MG  --  1.7 1.8 2.0  PHOS  --  3.6  --   --    GFR: Estimated Creatinine Clearance: 92.7 mL/min (by C-G formula based on SCr of 0.94 mg/dL). Liver Function Tests: Recent Labs  Lab 03/17/18 0211 03/17/18 2207  AST 19 47*  ALT 13 19  ALKPHOS 144* 107  BILITOT 0.3 0.3  PROT 6.3* 5.0*  ALBUMIN 2.6* 1.9*   No results for input(s): LIPASE, AMYLASE in the last 168 hours. No results for input(s): AMMONIA in the last 168 hours. Coagulation Profile: Recent Labs  Lab 03/17/18 2207  INR 1.02   Cardiac Enzymes: Recent Labs  Lab 03/17/18 2207 03/18/18 0335  TROPONINI 0.07* 0.04*   BNP (last 3 results) No results for input(s): PROBNP in the last 8760 hours. HbA1C: No results for input(s): HGBA1C in the last 72 hours. CBG: Recent Labs  Lab  03/19/18 1702 03/19/18 2137 03/20/18 0733 03/20/18 1113 03/20/18 1606  GLUCAP 129* 176* 83 143* 106*   Lipid Profile: No results for input(s): CHOL, HDL, LDLCALC, TRIG, CHOLHDL, LDLDIRECT in the last 72 hours. Thyroid Function Tests: Recent Labs    03/17/18 2207  TSH 1.426   Anemia Panel: No results for input(s): VITAMINB12, FOLATE, FERRITIN, TIBC, IRON, RETICCTPCT in the last 72 hours. Urine analysis:    Component Value Date/Time   COLORURINE STRAW (A) 03/17/2018 2154   APPEARANCEUR CLEAR 03/17/2018 2154   APPEARANCEUR Clear 09/02/2017 1430   LABSPEC 1.024 03/17/2018 2154   PHURINE 6.0 03/17/2018 2154   GLUCOSEU NEGATIVE 03/17/2018 2154   HGBUR SMALL (A) 03/17/2018 2154   BILIRUBINUR NEGATIVE 03/17/2018 2154   BILIRUBINUR Negative 09/02/2017 1430   KETONESUR NEGATIVE 03/17/2018 2154   PROTEINUR NEGATIVE 03/17/2018 2154   NITRITE NEGATIVE 03/17/2018 2154   LEUKOCYTESUR NEGATIVE 03/17/2018 2154   No results found for this or any previous visit (from the past 240 hour(s)).    Radiology Studies:  No results found.  Scheduled Meds: . docusate sodium  100 mg Oral TID  . flecainide  50 mg Oral Q12H  . ibuprofen  600 mg Oral Q6H  . insulin aspart  0-15 Units Subcutaneous TID WC  . insulin aspart  0-5 Units Subcutaneous QHS  . insulin glargine  15 Units Subcutaneous QHS  . metoprolol tartrate  50 mg Oral BID  . prenatal vitamin w/FE, FA  1 tablet Oral Daily  . sodium chloride flush  3 mL Intravenous Q12H   Continuous Infusions: . sodium chloride 10 mL (03/18/18 1649)     LOS: 3 days   Time spent: 25 minutes.  Barnetta Chapel, MD [facility] [email or web address] Password [facility] 03/20/2018, 5:00 PM

## 2018-03-20 NOTE — Progress Notes (Signed)
Post Partum Day 3 after difficult delivery, shoulder dystocia, x 3 minutes, with 4 th degree lac.  Subjective: up ad lib, voiding and tolerating PO had some constipation and with valsalva yesterday the heart rate was more erratic, so meds changed , according to pt.  Objective: Blood pressure 134/88, pulse 80, temperature 99.3 F (37.4 C), temperature source Oral, resp. rate (!) 9, height 5\' 4"  (1.626 m), weight 87.3 kg, last menstrual period 06/10/2017, SpO2 92 %, unknown if currently breastfeeding.  Physical Exam:  General: alert, cooperative and no distress Lochia: appropriate Uterine Fundus: firm Incision: healing well, no significant drainage, no dehiscence DVT Evaluation: No evidence of DVT seen on physical exam.  Recent Labs    03/18/18 0335 03/19/18 0326  HGB 10.6* 10.0*  HCT 32.1* 31.2*    Assessment/Plan: Patient stable from OB standpoint, may be d/c whenever Cardiac status allows.  Pt to followup 1 wk-2 wk after delivery with [facility], [phone] We will sign off, til outpatient eval at [facility] Please d/c on stool softener such as Miralax 17 gm daily.  LOS: 3 days   Tilda Burrow 03/20/2018, 10:11 AM

## 2018-03-20 NOTE — Progress Notes (Signed)
Notified by CCMD at the beginning of the shift that patient's HR was sustaining in the 240's-250's.  Patient was supine in bed when the writer went to check on patient. Her only complain was palpitation and lightheadedness. HR went up to 270. Rapid response paged and came to assess patient; please see her note. On call Cards Fellow Dr. Aron Baba paged. Verbal order received for Metoprolol 10 mg IV x 1 and given.. Flecainide 50 mg ordered and given along with scheduled metoprolol. HR improved, its currently in the 70's. Dr. Aron Baba stopped by to see the patient.  Will continue to monitor patient.

## 2018-03-20 NOTE — Progress Notes (Signed)
Progress Note  Patient Name: Jessica Carey Date of Encounter: 03/20/2018  Primary Cardiologist: Dina Rich, MD (prefers to follow up in [facility])  Subjective   Feeling well. Had an episode of SVT last night after straining to have a bowel movement.   Inpatient Medications    Scheduled Meds: . docusate sodium  100 mg Oral TID  . flecainide  50 mg Oral Q12H  . ibuprofen  600 mg Oral Q6H  . insulin aspart  0-15 Units Subcutaneous TID WC  . insulin aspart  0-5 Units Subcutaneous QHS  . insulin glargine  15 Units Subcutaneous QHS  . metoprolol tartrate  25 mg Oral BID  . prenatal vitamin w/FE, FA  1 tablet Oral Daily  . sodium chloride flush  3 mL Intravenous Q12H   Continuous Infusions: . sodium chloride 10 mL (03/18/18 1649)   PRN Meds: sodium chloride, acetaminophen, hydrOXYzine, magnesium hydroxide, oxyCODONE   Vital Signs    Vitals:   03/20/18 0108 03/20/18 0208 03/20/18 0214 03/20/18 0521  BP: (!) 150/95 (!) 152/94 (!) 141/93 134/88  Pulse: 82 74 82 80  Resp: (!) 21 18 18  (!) 9  Temp:    99.3 F (37.4 C)  TempSrc:    Oral  SpO2: 99% 96% 96% 92%  Weight:    87.3 kg  Height:        Intake/Output Summary (Last 24 hours) at 03/20/2018 1305 Last data filed at 03/20/2018 0630 Gross per 24 hour  Intake 356.18 ml  Output -  Net 356.18 ml   Last 3 Weights 03/20/2018 03/19/2018 03/18/2018  Weight (lbs) 192 lb 7.4 oz 196 lb 14.4 oz 209 lb 3.5 oz  Weight (kg) 87.3 kg 89.313 kg 94.9 kg      Telemetry    Sinus rhythm.  PACs, PVC.  Ventricular bigeminy. - Personally Reviewed  ECG    N/a  - Personally Reviewed  Physical Exam   VS:  BP 134/88 (BP Location: Left Arm)   Pulse 80   Temp 99.3 F (37.4 C) (Oral)   Resp (!) 9   Ht 5\' 4"  (1.626 m)   Wt 87.3 kg   LMP 06/10/2017   SpO2 92%   Breastfeeding Unknown   BMI 33.04 kg/m  , BMI Body mass index is 33.04 kg/m. GENERAL:  Well appearing HEENT: Pupils equal round and reactive, fundi not visualized,  oral mucosa unremarkable NECK:  No jugular venous distention, waveform within normal limits, carotid upstroke brisk LYMPHATICS:  No cervical adenopathy LUNGS:  Clear to auscultation bilaterally HEART:  RRR.  PMI not displaced or sustained,S1 and S2 within normal limits, no S3, no S4, no clicks, no rubs, no murmurs ABD:  Flat, positive bowel sounds normal in frequency in pitch, no bruits, no rebound, no guarding, no midline pulsatile mass, no hepatomegaly, no splenomegaly EXT:  2 plus pulses throughout, no edema, no cyanosis no clubbing SKIN:  No rashes no nodules NEURO:  Cranial nerves II through XII grossly intact, motor grossly intact throughout [facility]:  Cognitively intact, oriented to person place and time   Labs    Chemistry Recent Labs  Lab 03/17/18 0211 03/17/18 2207 03/18/18 0335 03/19/18 0326  NA 133* 135 139 138  K 3.8 3.8 4.0 3.8  CL 108 106 110 107  CO2 19* 18* 18* 22  GLUCOSE 82 214* 101* 97  BUN 12 7 6 14   CREATININE 0.65 0.84 0.77 0.94  CALCIUM 8.4* 8.3* 8.5* 8.5*  PROT 6.3* 5.0*  --   --  ALBUMIN 2.6* 1.9*  --   --   AST 19 47*  --   --   ALT 13 19  --   --   ALKPHOS 144* 107  --   --   BILITOT 0.3 0.3  --   --   GFRNONAA >60 >60 >60 >60  GFRAA >60 >60 >60 >60  ANIONGAP 6 11 11 9      Hematology Recent Labs  Lab 03/17/18 2207 03/18/18 0335 03/19/18 0326  WBC 27.9* 24.3* 16.5*  RBC 3.75* 3.80* 3.59*  HGB 10.5* 10.6* 10.0*  HCT 31.6* 32.1* 31.2*  MCV 84.3 84.5 86.9  MCH 28.0 27.9 27.9  MCHC 33.2 33.0 32.1  RDW 13.6 13.6 13.9  PLT 121* 122* 114*    Cardiac Enzymes Recent Labs  Lab 03/17/18 2207 03/18/18 0335  TROPONINI 0.07* 0.04*   No results for input(s): TROPIPOC in the last 168 hours.   BNPNo results for input(s): BNP, PROBNP in the last 168 hours.   DDimer  Recent Labs  Lab 03/17/18 2207  DDIMER 15.97*     Radiology    No results found.  Cardiac Studies   Echo 03/18/18: IMPRESSIONS    1. The left ventricle has low  normal systolic function, with an ejection fraction of 50-55%. The cavity size was normal. There is mildly increased left ventricular wall thickness. Left ventricular diastolic Doppler parameters are consistent with  pseudonormalization.  2. The right ventricle has normal systolic function. The cavity was normal. There is no increase in right ventricular wall thickness.  3. Right atrial pressure is estimated at 8 mmHg.  4. Left atrial size was moderately dilated.  5. The mitral valve is normal in structure. There is mild thickening.  6. The tricuspid valve is normal in structure.  7. The aortic valve is tricuspid.  8. The pulmonic valve was normal in structure.  9. The inferior vena cava was dilated in size with >50% respiratory variability. 10. Extremely limited; Low normal LV systolic function; focal wall motion abnormality cannot be excluded; moderate diastolic dysfunction; mild LVH; moderate LAE.  Patient Profile     Ms. Komm is a 58F with paroxysmal atrial flutter, gestational hypertension and gestational diabetes who is one day post-partum and developed recurrent atrial fibrillation with RVR.    Assessment & Plan    # Paroxysmal atrial fibrillation: # PACs: # PVCs: # SVTL Currently maintaining sinus rhythm with PACs and PVCs.  No recurrent atrial fibrillation.  She did have a long episode of SVT.  Rate was 247 bpm.  She as symptomatic.  She started on flecainide and no recurrent events today.  Have discussed with Dr. Elberta Fortis.  She will see Dr. Ladona Ridgel and likely undergo ablation on Monday.  Continue metoprolol and flecainide.  # Gestational hypertension:  BP poorly controlled. Increase metoprolol to 50mg  bid.      For questions or updates, please contact [facility] Please consult [email or web address] for contact info under        Signed, Chilton Si, MD  03/20/2018, 1:05 PM

## 2018-03-21 LAB — GLUCOSE, CAPILLARY
Glucose-Capillary: 86 mg/dL (ref 70–99)
Glucose-Capillary: 117 mg/dL — ABNORMAL HIGH (ref 70–99)
Glucose-Capillary: 104 mg/dL — ABNORMAL HIGH (ref 70–99)
Glucose-Capillary: 198 mg/dL — ABNORMAL HIGH (ref 70–99)

## 2018-03-21 NOTE — Progress Notes (Signed)
Progress Note  Patient Name: Jessica Carey Date of Encounter: 03/21/2018  Primary Cardiologist: Dina Rich, MD (prefers to follow up in [facility])  Subjective   Feeling tired.  Continues to have palpitations.  No recurrent sustained arrhythmias.   Inpatient Medications    Scheduled Meds: . docusate sodium  100 mg Oral TID  . flecainide  50 mg Oral Q12H  . ibuprofen  600 mg Oral Q6H  . insulin aspart  0-15 Units Subcutaneous TID WC  . insulin aspart  0-5 Units Subcutaneous QHS  . insulin glargine  15 Units Subcutaneous QHS  . metoprolol tartrate  50 mg Oral BID  . prenatal vitamin w/FE, FA  1 tablet Oral Daily  . sodium chloride flush  3 mL Intravenous Q12H   Continuous Infusions: . sodium chloride 10 mL (03/18/18 1649)   PRN Meds: sodium chloride, acetaminophen, hydrOXYzine, magnesium hydroxide, oxyCODONE   Vital Signs    Vitals:   03/20/18 2229 03/21/18 0432 03/21/18 0437 03/21/18 0924  BP:  (!) 145/82  118/82  Pulse: 73 66  85  Resp:  20    Temp:  98.2 F (36.8 C)    TempSrc:  Oral    SpO2:  99%    Weight:   86.8 kg   Height:       No intake or output data in the 24 hours ending 03/21/18 1018 Last 3 Weights 03/21/2018 03/20/2018 03/19/2018  Weight (lbs) 191 lb 6.4 oz 192 lb 7.4 oz 196 lb 14.4 oz  Weight (kg) 86.818 kg 87.3 kg 89.313 kg      Telemetry    Sinus rhythm.  PACs, PVC.  Ventricular bigeminy. Short runs of SVT. - Personally Reviewed  ECG    N/a  - Personally Reviewed  Physical Exam   VS:  BP 118/82   Pulse 85   Temp 98.2 F (36.8 C) (Oral)   Resp 20   Ht 5\' 4"  (1.626 m)   Wt 86.8 kg   LMP 06/10/2017   SpO2 99%   Breastfeeding Unknown   BMI 32.85 kg/m  , BMI Body mass index is 32.85 kg/m. GENERAL:  Well appearing HEENT: Pupils equal round and reactive, fundi not visualized, oral mucosa unremarkable NECK:  No jugular venous distention, waveform within normal limits, carotid upstroke brisk LYMPHATICS:  No cervical  adenopathy LUNGS:  Clear to auscultation bilaterally HEART:  RRR.  PMI not displaced or sustained,S1 and S2 within normal limits, no S3, no S4, no clicks, no rubs, no murmurs ABD:  Flat, positive bowel sounds normal in frequency in pitch, no bruits, no rebound, no guarding, no midline pulsatile mass, no hepatomegaly, no splenomegaly EXT:  2 plus pulses throughout, no edema, no cyanosis no clubbing SKIN:  No rashes no nodules NEURO:  Cranial nerves II through XII grossly intact, motor grossly intact throughout [facility]:  Cognitively intact, oriented to person place and time   Labs    Chemistry Recent Labs  Lab 03/17/18 0211 03/17/18 2207 03/18/18 0335 03/19/18 0326  NA 133* 135 139 138  K 3.8 3.8 4.0 3.8  CL 108 106 110 107  CO2 19* 18* 18* 22  GLUCOSE 82 214* 101* 97  BUN 12 7 6 14   CREATININE 0.65 0.84 0.77 0.94  CALCIUM 8.4* 8.3* 8.5* 8.5*  PROT 6.3* 5.0*  --   --   ALBUMIN 2.6* 1.9*  --   --   AST 19 47*  --   --   ALT 13 19  --   --  ALKPHOS 144* 107  --   --   BILITOT 0.3 0.3  --   --   GFRNONAA >60 >60 >60 >60  GFRAA >60 >60 >60 >60  ANIONGAP 6 11 11 9      Hematology Recent Labs  Lab 03/17/18 2207 03/18/18 0335 03/19/18 0326  WBC 27.9* 24.3* 16.5*  RBC 3.75* 3.80* 3.59*  HGB 10.5* 10.6* 10.0*  HCT 31.6* 32.1* 31.2*  MCV 84.3 84.5 86.9  MCH 28.0 27.9 27.9  MCHC 33.2 33.0 32.1  RDW 13.6 13.6 13.9  PLT 121* 122* 114*    Cardiac Enzymes Recent Labs  Lab 03/17/18 2207 03/18/18 0335  TROPONINI 0.07* 0.04*   No results for input(s): TROPIPOC in the last 168 hours.   BNPNo results for input(s): BNP, PROBNP in the last 168 hours.   DDimer  Recent Labs  Lab 03/17/18 2207  DDIMER 15.97*     Radiology    No results found.  Cardiac Studies   Echo 03/18/18: IMPRESSIONS    1. The left ventricle has low normal systolic function, with an ejection fraction of 50-55%. The cavity size was normal. There is mildly increased left ventricular wall  thickness. Left ventricular diastolic Doppler parameters are consistent with  pseudonormalization.  2. The right ventricle has normal systolic function. The cavity was normal. There is no increase in right ventricular wall thickness.  3. Right atrial pressure is estimated at 8 mmHg.  4. Left atrial size was moderately dilated.  5. The mitral valve is normal in structure. There is mild thickening.  6. The tricuspid valve is normal in structure.  7. The aortic valve is tricuspid.  8. The pulmonic valve was normal in structure.  9. The inferior vena cava was dilated in size with >50% respiratory variability. 10. Extremely limited; Low normal LV systolic function; focal wall motion abnormality cannot be excluded; moderate diastolic dysfunction; mild LVH; moderate LAE.  Patient Profile     Jessica Carey is a 8F with paroxysmal atrial flutter, gestational hypertension and gestational diabetes who is one day post-partum and developed recurrent atrial fibrillation with RVR.    Assessment & Plan    # Paroxysmal atrial fibrillation: # PACs: # PVCs: # SVTL Currently maintaining sinus rhythm with PACs and PVCs.  No recurrent atrial fibrillation or sustained SVT in the last 24 hours.  Rate in SVT was 247 bpm and she was symptomatic.  She started on flecainide and metoprolol this admission.  Have discussed with Dr. Elberta Fortis.  Jessica Carey will be NPO after midnight.  She will see Dr. Ladona Ridgel on 2/17 and likely undergo ablation.  Continue metoprolol and flecainide.  # Gestational hypertension:  BP better controlled after increasing metoprolol to 50mg  bid.      For questions or updates, please contact [facility] Please consult [email or web address] for contact info under        Signed, Chilton Si, MD  03/21/2018, 10:18 AM

## 2018-03-21 NOTE — Progress Notes (Signed)
PROGRESS NOTE  Jessica Carey  HKF:[id number] DOB: 1985/11/09 DOA: 03/17/2018 PCP: Patient, No Pcp Per  Brief Narrative: Jessica Carey is a 33 y.o. G1P1 s/p NSVD at [redacted]w[redacted]d on 2/12 complicated by pre-E, shoulder dystocia and 4th degree perineal laceration, with a history of PAF and type 2 diabetes mellitus.  Labor and delivery was complicated by fever for which ampicillin was given and rapid A Flutter. Cardiology was consulted and diltiazem gtt started, patient transferred postpartum following operative repair of laceration to [facility]. Zosyn was started given severity of laceration. Baby girl had APGARs 1, 3, transferred to NICU. D-dimer noted to be elevated and subsequent CTA chest showed no PE, mild cardiomegaly, and pulmonary GGOs, nonspecific.   03/20/2018: Patient went into SVT early hours of this morning, with heart rate close to 250 bpm.  Patient is currently on flecainide and metoprolol.  Cardiology is directing care.  Possible ablation on Monday.  Will defer further work-up to cardiology team.  03/21/2018: Patient seen alongside patient's sister.  No new complaints.  Also discussed with cardiology team.  Likely EP input in the morning.  Continue other management as already outlined.   Assessment & Plan: Active Problems:   Atrial fibrillation (HCC)   Gestational hypertension   Paroxysmal A-fib (HCC)   Fourth degree perineal laceration   Shoulder dystocia, delivered   Gestational diabetes mellitus (GDM) controlled on oral hypoglycemic drug, antepartum  Supraventricular tachycardia/paroxysmal atrial fibrillation/atrial flutter with RVR:  -Had not taken labetalol as prescribed by cardiology due to concerns for fetotoxicity.  -RVR possible precipitated by traumatic delivery -Diltiazem gtt has been discontinued.   -Patient is currently on Toprol 50 Mg p.o. twice daily.   - Cardiology consulted for assistance in management - CHA2DS2-VASc score is 2. Holding anticoagulation with recent laceration  repair.  -EP team to see patient in a.m. for possible further work-up.   -Echocardiogram: EF is 50 to 55%, with pseudonormalization of the left ventricle diastolic function.    Fourth degree perineal tear: -Completed course of IV Zosyn as suggested by obstetrics and gynecology team.  Diabetes mellitus type 2: - Continue basal-bolus insulin. Hold OSU and cover with SSI instead. Decreased lantus dose initially. CBG at inpatient goal so will not change, but continue AC/HS monitoring. - Carb-modified diet 03/20/2018: Continue to optimize.  Patient is on subcutaneous Lantus insulin 15 units at nighttime, as well as NovoLog insulin 2 units 3 times daily AC.  Blood sugar is well controlled.  HTN:  - Continue monitoring with rate control agents. 03/20/2018: Continue to optimize.  Troponin elevation:  -Mild, downward trending consistent with demand ischemia.  - Further interventions/work by EP in a.m.    Elevated d-dimer:  Negative CTA PE study.  DVT prophylaxis: SCDs Code Status: Full Family Communication:  Disposition Plan: Home once work up complete, cardiac status stabilizes, and cleared by OB.  Consultants:   OB/GYN  Cardiology  Procedures:   Operative repair of fourth degree perineal laceration 03/17/2018 by Dr. Macon Large.  Antimicrobials:  Zosyn 2/12 >> discontinued.  Subjective: No new complaints. No fever or chills.  Objective: Vitals:   03/20/18 2229 03/21/18 0432 03/21/18 0437 03/21/18 0924  BP:  (!) 145/82  118/82  Pulse: 73 66  85  Resp:  20    Temp:  98.2 F (36.8 C)    TempSrc:  Oral    SpO2:  99%    Weight:   86.8 kg   Height:        Intake/Output Summary (Last 24 hours)  at 03/21/2018 1401 Last data filed at 03/21/2018 1000 Gross per 24 hour  Intake 0 ml  Output -  Net 0 ml   Filed Weights   03/19/18 0608 03/20/18 0521 03/21/18 0437  Weight: 89.3 kg 87.3 kg 86.8 kg    Gen: 33 y.o. female in no distress  Pulm: Non-labored breathing room air.  Clear to auscultation laterally.  CV: S1-S2  GI: Abdomen is obese, soft nontender. Ext: Mild edema of the ankle and the hands.   Neuro: Alert and oriented.  Patient moves all extremities.  Data Reviewed: I have personally reviewed following labs and imaging studies  CBC: Recent Labs  Lab 03/17/18 0211 03/17/18 2207 03/18/18 0335 03/19/18 0326  WBC 11.9* 27.9* 24.3* 16.5*  NEUTROABS  --  24.8*  --   --   HGB 11.1* 10.5* 10.6* 10.0*  HCT 33.8* 31.6* 32.1* 31.2*  MCV 87.1 84.3 84.5 86.9  PLT 129* 121* 122* 114*   Basic Metabolic Panel: Recent Labs  Lab 03/17/18 0211 03/17/18 2207 03/18/18 0335 03/19/18 0326  NA 133* 135 139 138  K 3.8 3.8 4.0 3.8  CL 108 106 110 107  CO2 19* 18* 18* 22  GLUCOSE 82 214* 101* 97  BUN 12 7 6 14   CREATININE 0.65 0.84 0.77 0.94  CALCIUM 8.4* 8.3* 8.5* 8.5*  MG  --  1.7 1.8 2.0  PHOS  --  3.6  --   --    GFR: Estimated Creatinine Clearance: 92.4 mL/min (by C-G formula based on SCr of 0.94 mg/dL). Liver Function Tests: Recent Labs  Lab 03/17/18 0211 03/17/18 2207  AST 19 47*  ALT 13 19  ALKPHOS 144* 107  BILITOT 0.3 0.3  PROT 6.3* 5.0*  ALBUMIN 2.6* 1.9*   No results for input(s): LIPASE, AMYLASE in the last 168 hours. No results for input(s): AMMONIA in the last 168 hours. Coagulation Profile: Recent Labs  Lab 03/17/18 2207  INR 1.02   Cardiac Enzymes: Recent Labs  Lab 03/17/18 2207 03/18/18 0335  TROPONINI 0.07* 0.04*   BNP (last 3 results) No results for input(s): PROBNP in the last 8760 hours. HbA1C: No results for input(s): HGBA1C in the last 72 hours. CBG: Recent Labs  Lab 03/20/18 1113 03/20/18 1606 03/20/18 2117 03/21/18 0749 03/21/18 1132  GLUCAP 143* 106* 143* 86 117*   Lipid Profile: No results for input(s): CHOL, HDL, LDLCALC, TRIG, CHOLHDL, LDLDIRECT in the last 72 hours. Thyroid Function Tests: No results for input(s): TSH, T4TOTAL, FREET4, T3FREE, THYROIDAB in the last 72 hours. Anemia  Panel: No results for input(s): VITAMINB12, FOLATE, FERRITIN, TIBC, IRON, RETICCTPCT in the last 72 hours. Urine analysis:    Component Value Date/Time   COLORURINE STRAW (A) 03/17/2018 2154   APPEARANCEUR CLEAR 03/17/2018 2154   APPEARANCEUR Clear 09/02/2017 1430   LABSPEC 1.024 03/17/2018 2154   PHURINE 6.0 03/17/2018 2154   GLUCOSEU NEGATIVE 03/17/2018 2154   HGBUR SMALL (A) 03/17/2018 2154   BILIRUBINUR NEGATIVE 03/17/2018 2154   BILIRUBINUR Negative 09/02/2017 1430   KETONESUR NEGATIVE 03/17/2018 2154   PROTEINUR NEGATIVE 03/17/2018 2154   NITRITE NEGATIVE 03/17/2018 2154   LEUKOCYTESUR NEGATIVE 03/17/2018 2154   No results found for this or any previous visit (from the past 240 hour(s)).    Radiology Studies: No results found.  Scheduled Meds: . docusate sodium  100 mg Oral TID  . flecainide  50 mg Oral Q12H  . ibuprofen  600 mg Oral Q6H  . insulin aspart  0-15 Units Subcutaneous  TID WC  . insulin aspart  0-5 Units Subcutaneous QHS  . insulin glargine  15 Units Subcutaneous QHS  . metoprolol tartrate  50 mg Oral BID  . prenatal vitamin w/FE, FA  1 tablet Oral Daily  . sodium chloride flush  3 mL Intravenous Q12H   Continuous Infusions: . sodium chloride 10 mL (03/18/18 1649)     LOS: 4 days   Time spent: 25 minutes.  Barnetta ChapelSylvester I Joselin Crandell, MD [facility] [email or web address] Password [email or web address] 03/21/2018, 2:01 PM

## 2018-03-22 ENCOUNTER — Inpatient Hospital Stay (HOSPITAL_COMMUNITY): Admission: RE | Admit: 2018-03-22 | Payer: BLUE CROSS/BLUE SHIELD | Source: Ambulatory Visit

## 2018-03-22 ENCOUNTER — Encounter (HOSPITAL_COMMUNITY)
Admission: AD | Disposition: A | Discharge status: Home / Self Care | Payer: Self-pay | Source: Home / Self Care | Attending: Internal Medicine

## 2018-03-22 ENCOUNTER — Encounter (HOSPITAL_COMMUNITY): Payer: Self-pay | Admitting: Internal Medicine

## 2018-03-22 DIAGNOSIS — I471 Supraventricular tachycardia: Secondary | ICD-10-CM

## 2018-03-22 HISTORY — PX: SVT ABLATION: EP1225

## 2018-03-22 HISTORY — PX: ELECTROPHYSIOLOGY STUDY: EP1205

## 2018-03-22 LAB — CBC WITH DIFFERENTIAL/PLATELET
Abs Immature Granulocytes: 0.11 10*3/uL — ABNORMAL HIGH (ref 0.00–0.07)
Basophils Absolute: 0 10*3/uL (ref 0.0–0.1)
Basophils Relative: 0 %
Eosinophils Absolute: 0.4 10*3/uL (ref 0.0–0.5)
Eosinophils Relative: 3 %
HCT: 36 % (ref 36.0–46.0)
Hemoglobin: 11.5 g/dL — ABNORMAL LOW (ref 12.0–15.0)
Immature Granulocytes: 1 %
Lymphocytes Relative: 16 %
Lymphs Abs: 2.3 10*3/uL (ref 0.7–4.0)
MCH: 27.7 pg (ref 26.0–34.0)
MCHC: 31.9 g/dL (ref 30.0–36.0)
MCV: 86.7 fL (ref 80.0–100.0)
Monocytes Absolute: 0.7 10*3/uL (ref 0.1–1.0)
Monocytes Relative: 5 %
Neutro Abs: 10.5 10*3/uL — ABNORMAL HIGH (ref 1.7–7.7)
Neutrophils Relative %: 75 %
Platelets: 183 10*3/uL (ref 150–400)
RBC: 4.15 MIL/uL (ref 3.87–5.11)
RDW: 14 % (ref 11.5–15.5)
WBC: 14 10*3/uL — ABNORMAL HIGH (ref 4.0–10.5)
nRBC: 0 % (ref 0.0–0.2)

## 2018-03-22 LAB — RENAL FUNCTION PANEL
Albumin: 2.5 g/dL — ABNORMAL LOW (ref 3.5–5.0)
Anion gap: 8 (ref 5–15)
BUN: 11 mg/dL (ref 6–20)
CO2: 23 mmol/L (ref 22–32)
Calcium: 8.5 mg/dL — ABNORMAL LOW (ref 8.9–10.3)
Chloride: 107 mmol/L (ref 98–111)
Creatinine, Ser: 0.69 mg/dL (ref 0.44–1.00)
GFR calc Af Amer: >60 mL/min (ref >60)
GFR calc non Af Amer: >60 mL/min (ref >60)
Glucose, Bld: 97 mg/dL (ref 70–99)
Phosphorus: 3.5 mg/dL (ref 2.5–4.6)
Potassium: 3.8 mmol/L (ref 3.5–5.1)
Sodium: 138 mmol/L (ref 135–145)

## 2018-03-22 LAB — GLUCOSE, CAPILLARY
Glucose-Capillary: 106 mg/dL — ABNORMAL HIGH (ref 70–99)
Glucose-Capillary: 72 mg/dL (ref 70–99)
Glucose-Capillary: 122 mg/dL — ABNORMAL HIGH (ref 70–99)
Glucose-Capillary: 180 mg/dL — ABNORMAL HIGH (ref 70–99)

## 2018-03-22 LAB — MAGNESIUM: Magnesium: 1.7 mg/dL (ref 1.7–2.4)

## 2018-03-22 SURGERY — ELECTROPHYSIOLOGY STUDY

## 2018-03-22 MED ORDER — HEPARIN (PORCINE) IN NACL 1000-0.9 UT/500ML-% IV SOLN
INTRAVENOUS | Status: DC | PRN
  Administered 2018-03-22: 500 mL

## 2018-03-22 MED ORDER — MIDAZOLAM HCL 5 MG/5ML IJ SOLN
INTRAMUSCULAR | Status: AC
  Filled 2018-03-22: qty 5

## 2018-03-22 MED ORDER — HEPARIN (PORCINE) IN NACL 1000-0.9 UT/500ML-% IV SOLN
INTRAVENOUS | Status: AC
  Filled 2018-03-22: qty 500

## 2018-03-22 MED ORDER — FENTANYL CITRATE (PF) 100 MCG/2ML IJ SOLN
INTRAMUSCULAR | Status: AC
  Filled 2018-03-22: qty 2

## 2018-03-22 MED ORDER — ACETAMINOPHEN 325 MG PO TABS
650.0000 mg | ORAL_TABLET | ORAL | Status: DC | PRN
  Administered 2018-03-22: 650 mg via ORAL
  Filled 2018-03-22: qty 2

## 2018-03-22 MED ORDER — BUPIVACAINE HCL (PF) 0.25 % IJ SOLN
INTRAMUSCULAR | Status: AC
  Filled 2018-03-22: qty 60

## 2018-03-22 MED ORDER — MIDAZOLAM HCL 5 MG/5ML IJ SOLN
INTRAMUSCULAR | Status: DC | PRN
  Administered 2018-03-22 (×8): 1 mg via INTRAVENOUS

## 2018-03-22 MED ORDER — BUPIVACAINE HCL (PF) 0.25 % IJ SOLN
INTRAMUSCULAR | Status: DC | PRN
  Administered 2018-03-22 (×2): 20 mL

## 2018-03-22 MED ORDER — SODIUM CHLORIDE 0.9% FLUSH
3.0000 mL | Freq: Two times a day (BID) | INTRAVENOUS | Status: DC
  Administered 2018-03-22: 3 mL via INTRAVENOUS

## 2018-03-22 MED ORDER — SODIUM CHLORIDE 0.9 % IV SOLN
INTRAVENOUS | Status: DC
  Administered 2018-03-22: 08:00:00 via INTRAVENOUS

## 2018-03-22 MED ORDER — ISOPROTERENOL HCL 0.2 MG/ML IJ SOLN
INTRAMUSCULAR | Status: AC
  Filled 2018-03-22: qty 5

## 2018-03-22 MED ORDER — SODIUM CHLORIDE 0.9 % IV SOLN
INTRAVENOUS | Status: AC | PRN
  Administered 2018-03-22: 4 ug/min via INTRAVENOUS

## 2018-03-22 MED ORDER — FENTANYL CITRATE (PF) 100 MCG/2ML IJ SOLN
INTRAMUSCULAR | Status: DC | PRN
  Administered 2018-03-22 (×8): 12.5 ug via INTRAVENOUS

## 2018-03-22 MED ORDER — ONDANSETRON HCL 4 MG/2ML IJ SOLN: 4.0000 mg | Freq: Four times a day (QID) | INTRAMUSCULAR | Status: DC | PRN

## 2018-03-22 MED ORDER — SODIUM CHLORIDE 0.9% FLUSH: 3.0000 mL | INTRAVENOUS | Status: DC | PRN

## 2018-03-22 MED ORDER — SODIUM CHLORIDE 0.9 % IV SOLN: 250.0000 mL | INTRAVENOUS | Status: DC | PRN

## 2018-03-22 SURGICAL SUPPLY — 11 items
BAG SNAP BAND KOVER 36X36 (MISCELLANEOUS) ×2 IMPLANT
CATH CELSIUS THERM D CV 7F (ABLATOR) ×1 IMPLANT
CATH HEX JOS 2-5-2 65CM 6F REP (CATHETERS) ×1 IMPLANT
CATH JOSEPH QUAD ALLRED 6F REP (CATHETERS) ×2 IMPLANT
PACK EP LATEX FREE (CUSTOM PROCEDURE TRAY) ×2
PACK EP LF (CUSTOM PROCEDURE TRAY) ×1 IMPLANT
PAD PRO RADIOLUCENT 2001M-C (PAD) ×2 IMPLANT
SHEATH PINNACLE 6F 10CM (SHEATH) ×2 IMPLANT
SHEATH PINNACLE 7F 10CM (SHEATH) ×1 IMPLANT
SHEATH PINNACLE 8F 10CM (SHEATH) ×1 IMPLANT
SHIELD RADPAD SCOOP 12X17 (MISCELLANEOUS) ×1 IMPLANT

## 2018-03-22 NOTE — H&P (Signed)
Patient ID: Jessica LeachRosa Carey MRN: [id number]; DOB: [birth year]  Admit date: 03/17/2018 Date of Consult: 03/18/2018  Primary Care Provider: Patient, No Pcp Per Primary Cardiologist: Dina RichBranch, Jonathan, MD  Primary Electrophysiologist:  None    Patient Profile:   [name] is a 33 year old post partum female s/p vaginal delivery 03/17/18 as well as prior h/o PAF who is being seen today for the evaluation of atrial flutter w/ RVR, at the request of Dr. Jarvis NewcomerGrunz, Internal Medicine.   History of Present Illness:    [name] is a 33 year old post partum female s/p vaginal delivery 03/17/18 as well as prior h/o PAF who is being seen today for the evaluation of atrial flutter w/ RVR, at the request of Dr. Jarvis NewcomerGrunz, Internal Medicine.   She was seen by Dr. Wyline MoodBranch in August 2019 for new onset atrial fibrillation. At that time, she was [redacted] weeks pregnant. When seen by Dr. Wyline MoodBranch, she was back in NSR w/ PACs. Echo showed normal LVEF at 55-60% and no significant wall motion abnormalities. Dr. Wyline MoodBranch recommended labetalol, given her pregnancy,100 mg BID PRN for palpations. Her CHA2DS2 VASc score was calculated at 2. Anticoagulation was considered however Dr. Wyline MoodBranch discussed with EP. Per consult note, "After discussing case with some of EP staff, and reviewing most recent guidelines would not recommend the use of anticoagulation at this time or antiplatet therapy. 2019 afib guideline update downgraded women with CHADS2Vasc score of 2 and anticoagulation to a IIB recommendation, with language indicating anticoag "may be considered" There is debate on whether gender alone imparts increased risk in absence of other differences in risk factors. Overall there is no strong recommendation for anticoag in this setting based on guidelines, in the setting of pregnancy the risk of complications is only increased. Continue prn labetalol for symptoms. Data would also argue against the use of antiplatelet therapy, with the  guidelines referencing a study showing net harm in this clinical situation".   Pt was admitted to [facility] yesterday in early labor and felt to be in new onset Pre eclampsia and required early delivery at only 38 weeks. She had vaginal delivery c/b 4th degree perineal laceration requiring operative repair. Pt developed atrial flutter w/ RVR in the 150s and started on a Cardizem drip and [facility]. Cardiology consulted for recommendations. Pt now back in NSR. Per history from pt, she has not been taking labetalol for palpitations due to side effects. Also states it was not working to help symptoms.   Also D-dimer was elevated but chest CT negative for PE. Echo pending. trops 0.07>>0.04, likely demand ischemia.        Past Medical History:  Diagnosis Date  . Boil, breast 07/04/2013  . Diabetes (HCC) 08/04/2013  . Diabetes mellitus without complication (HCC)    glyburide and insulin  . Dysrhythmia    a-fib  . Irregular periods          Past Surgical History:  Procedure Laterality Date  . BREAST SURGERY Right    breast abcess  . PERINEAL LACERATION REPAIR N/A 03/17/2018   Procedure: SUTURE REPAIR PERINEAL LACERATION;  Surgeon: Tereso NewcomerAnyanwu, Ugonna A, MD;  Location: [facility];  Service: Gynecology;  Laterality: N/A;  . SURGERY OF LIP       Home Medications:         Prior to Admission medications   Medication Sig Start Date End Date Taking? Authorizing Provider  glyBURIDE (DIABETA) 5 MG tablet Take 2  tablets (10 mg total) by mouth at bedtime. 02/05/18  Yes Lazaro Arms, MD  insulin glargine (LANTUS) 100 UNIT/ML injection Inject 0.26 mLs (26 Units total) into the skin daily. Patient taking differently: Inject 26 Units into the skin every morning.  02/21/18  Yes Constant, Peggy, MD  metFORMIN (GLUCOPHAGE) 1000 MG tablet Take 1 tablet (1,000 mg total) by mouth 2 (two) times daily with a meal. 09/23/17  Yes Booker, Merlene Laughter, CNM    Prenatal Vit-Fe Fumarate-FA (PNV PRENATAL PLUS MULTIVITAMIN) 27-1 MG TABS Take 1 tablet by mouth daily. 09/02/17  Yes Cresenzo-Dishmon, Scarlette Calico, CNM  NIFEdipine (PROCARDIA-XL/NIFEDICAL-XL) 30 MG 24 hr tablet Take 1 tablet (30 mg total) by mouth daily. Can increase to twice a day as needed for symptomatic contractions 02/21/18   Constant, Peggy, MD  progesterone (PROMETRIUM) 200 MG capsule Place 1 tablet in vagina every night Patient not taking: Reported on 03/08/2018 12/09/17   Jacklyn Shell, CNM    Inpatient Medications: Scheduled Meds: . docusate sodium  100 mg Oral TID  . insulin aspart  0-15 Units Subcutaneous TID WC  . insulin aspart  0-5 Units Subcutaneous QHS  . insulin glargine  15 Units Subcutaneous QHS  . perflutren lipid microspheres (DEFINITY) IV suspension      . prenatal vitamin w/FE, FA  1 tablet Oral Daily  . sodium chloride flush  3 mL Intravenous Q12H   Continuous Infusions: . sodium chloride    . diltiazem (CARDIZEM) infusion 5 mg/hr (03/18/18 0214)  . magnesium sulfate 1 - 4 g bolus IVPB    . piperacillin-tazobactam (ZOSYN)  IV 3.375 g (03/18/18 1005)   PRN Meds: sodium chloride, fentaNYL (SUBLIMAZE) injection  Allergies:   No Known Allergies  Social History:   Social History   Socioeconomic History  . Marital status: Single    Spouse name: Not on file  . Number of children: Not on file  . Years of education: Not on file  . Highest education level: Not on file  Occupational History  . Not on file  Social Needs  . Financial resource strain: Not hard at all  . Food insecurity:    Worry: Never true    Inability: Never true  . Transportation needs:    Medical: No    Non-medical: Not on file  Tobacco Use  . Smoking status: Never Smoker  . Smokeless tobacco: Never Used  Substance and Sexual Activity  . Alcohol use: Not Currently    Comment: occ.  . Drug use: No  . Sexual activity: Yes    Partners: Male     Birth control/protection: None  Lifestyle  . Physical activity:    Days per week: Not on file    Minutes per session: Not on file  . Stress: Rather much  Relationships  . Social connections:    Talks on phone: Not on file    Gets together: Not on file    Attends religious service: Not on file    Active member of club or organization: Not on file    Attends meetings of clubs or organizations: Not on file    Relationship status: Not on file  . Intimate partner violence:    Fear of current or ex partner: No    Emotionally abused: No    Physically abused: No    Forced sexual activity: No  Other Topics Concern  . Not on file  Social History Narrative   ** Merged History Encounter **  Family History:         Family History  Problem Relation Age of Onset  . Diabetes Mother   . Diabetes Father   . Hyperlipidemia Father   . Diabetes Sister   . Cancer Maternal Aunt        liver  . Diabetes Paternal Grandmother      ROS:  Please see the history of present illness.   All other ROS reviewed and negative.     Physical Exam/Data:         Vitals:   03/18/18 0444 03/18/18 0500 03/18/18 0714 03/18/18 1100  BP: 133/90  130/88 123/86  Pulse: 81  63 78  Resp: 16  18 16   Temp: 98.8 F (37.1 C)  98.7 F (37.1 C) 98.4 F (36.9 C)  TempSrc: Oral     SpO2:   99% 100%  Weight:  94.9 kg    Height:        Intake/Output Summary (Last 24 hours) at 03/18/2018 1548 Last data filed at 03/18/2018 1300    Gross per 24 hour  Intake 3275.83 ml  Output 6700 ml  Net -3424.17 ml   Last 3 Weights 03/18/2018 03/17/2018 03/17/2018  Weight (lbs) 209 lb 3.5 oz 215 lb 9.8 oz 215 lb 12 oz  Weight (kg) 94.9 kg 97.8 kg 97.864 kg     Body mass index is 35.91 kg/m.  General:  Young obese female in no acute distress HEENT: normal Lymph: no adenopathy Neck: no JVD Endocrine:  No thryomegaly Vascular: No carotid bruits; FA pulses 2+  bilaterally without bruits  Cardiac:  normal S1, S2; RRR; no murmur  Lungs:  clear to auscultation bilaterally, no wheezing, rhonchi or rales  Abd: soft, nontender, no hepatomegaly  Ext: no edema Musculoskeletal:  No deformities, BUE and BLE strength normal and equal Skin: warm and dry  Neuro:  CNs 2-12 intact, no focal abnormalities noted Psych:  Normal affect   EKG:  The EKG was personally reviewed and demonstrates:  Atrial flutter w/ RVR 2/12 @1404 , repeat EKG 2/13 showed NSR Telemetry:  Telemetry was personally reviewed and demonstrates:  NSR 80s  Relevant CV Studies: 2D Echo 09/2017 Study Conclusions  - Left ventricle: The cavity size was normal. Wall thickness was normal. Systolic function was normal. The estimated ejection fraction was in the range of 55% to 60%. Wall motion was normal; there were no regional wall motion abnormalities. Left ventricular diastolic function parameters were normal. - Aortic valve: Valve area (VTI): 1.79 cm^2. Valve area (Vmax): 1.74 cm^2. Valve area (Vmean): 1.85 cm^2. - Mitral valve: Valve area by continuity equation (using LVOT flow): 1.76 cm^2. - Left atrium: The atrium was mildly dilated. - Atrial septum: No defect or patent foramen ovale was identified. - Technically adequate study.  Laboratory Data:  Chemistry LastLabs       Recent Labs  Lab 03/17/18 0211 03/17/18 2207 03/18/18 0335  NA 133* 135 139  K 3.8 3.8 4.0  CL 108 106 110  CO2 19* 18* 18*  GLUCOSE 82 214* 101*  BUN 12 7 6   CREATININE 0.65 0.84 0.77  CALCIUM 8.4* 8.3* 8.5*  GFRNONAA >60 >60 >60  GFRAA >60 >60 >60  ANIONGAP 6 11 11       LastLabs      Recent Labs  Lab 03/17/18 0211 03/17/18 2207  PROT 6.3* 5.0*  ALBUMIN 2.6* 1.9*  AST 19 47*  ALT 13 19  ALKPHOS 144* 107  BILITOT 0.3 0.3  Hematology LastLabs       Recent Labs  Lab 03/17/18 0211 03/17/18 2207 03/18/18 0335  WBC 11.9* 27.9* 24.3*  RBC 3.88 3.75* 3.80*    HGB 11.1* 10.5* 10.6*  HCT 33.8* 31.6* 32.1*  MCV 87.1 84.3 84.5  MCH 28.6 28.0 27.9  MCHC 32.8 33.2 33.0  RDW 14.0 13.6 13.6  PLT 129* 121* 122*     Cardiac Enzymes LastLabs      Recent Labs  Lab 03/17/18 2207 03/18/18 0335  TROPONINI 0.07* 0.04*      LastLabs  No results for input(s): TROPIPOC in the last 168 hours.    BNP LastLabs  No results for input(s): BNP, PROBNP in the last 168 hours.    DDimer  LastLabs     Recent Labs  Lab 03/17/18 2207  DDIMER 15.97*      Radiology/Studies:  Ct Angio Chest Pe W Or Wo Contrast  Result Date: 03/18/2018 CLINICAL DATA:  Status post vaginal delivery. Atrial fibrillation. Elevated D-dimer. EXAM: CT ANGIOGRAPHY CHEST WITH CONTRAST TECHNIQUE: Multidetector CT imaging of the chest was performed using the standard protocol during bolus administration of intravenous contrast. Multiplanar CT image reconstructions and MIPs were obtained to evaluate the vascular anatomy. CONTRAST:  ISOVUE-370 IOPAMIDOL (ISOVUE-370) INJECTION 76% COMPARISON:  None. FINDINGS: Cardiovascular: No filling defects in the pulmonary arteries to suggest pulmonary emboli. Heart is mildly enlarged. Aorta is normal caliber. Mediastinum/Nodes: No mediastinal, hilar, or axillary adenopathy. Lungs/Pleura: Low lung volumes. Ground-glass opacities in the lungs could reflect atelectasis or edema. No effusions. Upper Abdomen: Imaging into the upper abdomen shows no acute findings. Musculoskeletal: Chest wall soft tissues are unremarkable. No acute bony abnormality. Review of the MIP images confirms the above findings. IMPRESSION: Mild cardiomegaly, likely related to postpartum state. Ground-glass opacities within the lungs could reflect atelectasis or edema. No evidence of pulmonary embolus. Electronically Signed   By: Charlett Nose M.D.   On: 03/18/2018 00:33    Assessment and Plan:   [name] is a 33 year old post partum female s/p vaginal delivery  03/17/18 as well as prior h/o PAF who is being seen today for the evaluation of atrial flutter w/ RVR, at the request of Dr. Jarvis Newcomer, Internal Medicine.   1. Paroxsymal Atrial Fibrillation/ Flutter: Documented afib in August 2019. Now with occurrence of atrial flutter w/ RVR in the 150s. K and TSH WNL. Chest CT negative for PE. Echo pending. Spontaneous conversion back to NSR with IV Cardizem. Remains in NSR currently w/ HR in the 80s. Was previously placed on labetalol PRN given pregnancy. Anticoagulation was also avoided during pregnancy. CHA2DS2 VASc score is 2 for DM and female sex. She is now post delivery. MD to see and will advise further management.    2. Post Partum Care: per IM and OBGYN.     For questions or updates, please contact [facility] Please consult [email or web address] for contact info under     Signed, Knute Neu  03/18/2018 3:48 PM  EP Attending  Patient seen and examined. Agree with the findings as noted above. The patient has had persistent SVT. She is recently post-partum. She wants to breast feed her baby and prefers not to take medical therapy for which she has had breakthrough arrhythmias. I have discussed the treatment options with the patient and the risks/benefits/goals/expectations of EP study and catheter ablation were reviewed and she wishes to proceed.  Leonia Reeves.D.

## 2018-03-22 NOTE — Progress Notes (Signed)
Site area: Right groin a 6 french X2 and a 8 french venous sheaths were removed   Site Prior to Removal:  Level 0  Pressure Applied For 20 MINUTES    Bedrest Beginning at 1115am  Manual:   Yes.    Patient Status During Pull:  stable  Post Pull Groin Site:  Level 0  Post Pull Instructions Given:  Yes.    Post Pull Pulses Present:  Yes.    Dressing Applied:  Yes.    Comments:  VS remain stable

## 2018-03-22 NOTE — Progress Notes (Addendum)
Site area: Right Internal Jugular a 7 french venous sheath was removed  Site Prior to Removal:  Level 0  Pressure Applied For 10 MINUTES    Bedrest Beginning at 1115am  Manual:   Yes.    Patient Status During Pull:  stable  Post Pull Groin Site:  Level 0  Post Pull Instructions Given:  Yes.    Post Pull Pulses Present:  Yes.    Dressing Applied:  Yes.    Comments:  VS remain stable

## 2018-03-22 NOTE — Progress Notes (Signed)
Patient ID: Jessica Carey, female   DOB: [birth year], 33 y.o.   MRN: [id number] EP Attending  Patient seen and examined. 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 She may be allowed to ambulate at 17:00 today and can be discharged home at 18:00 if she is doing well. She does not initially need any AV nodal blocking drugs. Followup with me in 3-4 weeks will be arranged. No heavy lifting for 1 week.  Leonia Reeves.D.

## 2018-03-22 NOTE — Progress Notes (Signed)
   Received page from RN. Heart rates in the mid 40's after ablation. BP stable. Most recent BP 131/76. Strong radial and posterior tibial pulses. Patient's states she feels a little weak but normal mentation. Ordered 12-lead EKG. Will continue to monitor BP closely and will watch on telemetry overnight. EP to see patient in the morning for any additional recommendations.   Corrin Parker, PA-C 03/22/2018 4:10 PM

## 2018-03-22 NOTE — Care Management Note (Addendum)
Case Management Note  Patient Details  Name: Jessica Carey MRN: [id number] Date of Birth: [birth year]  Subjective/Objective:    Pt presented post  Vaginal delivery 03-17-18. New onset Atrial Flutter with RVR- initiated on IV Cardizem. Post Ablation 03-22-18. Patient is being followed by OBGYN.           Action/Plan: CM will continue to monitor for additional transition of care needs.   Expected Discharge Date:  03/19/18               Expected Discharge Plan:  Home/Self Care  In-House Referral:  NA  Discharge planning Services  CM Consult  Post Acute Care Choice:   N/A Choice offered to:   N/A  DME Arranged:   N/A DME Agency:   N/A  HH Arranged:   N/A HH Agency:   N/A  Status of Service: Completed  If discussed at Long Length of Stay Meetings, dates discussed:    Additional Comments: 1510 03-22-18 Tomi Bamberger, RN,BSN [phone] CM spoke with patient. No HH needs identified at this time. OB to follow post hospitalization.  Gala Lewandowsky, RN 03/22/2018, 2:45 PM

## 2018-03-22 NOTE — Discharge Instructions (Signed)
Post ablation care/activity instructions (only) No driving for 4 days. No lifting over 5 lbs for 1 week. No vigorous or sexual activity for 1 week. You may return to work on 03/29/2018. Keep procedure sites clean & dry. If you notice increased pain, swelling, bleeding or pus, call/return!  You may shower, but no soaking baths/hot tubs/pools for 1 week.     Vaginal Delivery, Care After Refer to this sheet in the next few weeks. These instructions provide you with information about caring for yourself after vaginal delivery. Your health care provider may also give you more specific instructions. Your treatment has been planned according to current medical practices, but problems sometimes occur. Call your health care provider if you have any problems or questions. What can I expect after the procedure? After vaginal delivery, it is common to have:  Some bleeding from your vagina.  Soreness in your abdomen, your vagina, and the area of skin between your vaginal opening and your anus (perineum).  Pelvic cramps.  Fatigue. Follow these instructions at home: Medicines  Take over-the-counter and prescription medicines only as told by your health care provider.  If you were prescribed an antibiotic medicine, take it as told by your health care provider. Do not stop taking the antibiotic until it is finished. Driving   Do not drive or operate heavy machinery while taking prescription pain medicine.  Do not drive for 24 hours if you received a sedative. Lifestyle  Do not drink alcohol. This is especially important if you are breastfeeding or taking medicine to relieve pain.  Do not use tobacco products, including cigarettes, chewing tobacco, or e-cigarettes. If you need help quitting, ask your health care provider. Eating and drinking  Drink at least 8 eight-ounce glasses of water every day unless you are told not to by your health care provider. If you choose to breastfeed your baby, you may  need to drink more water than this.  Eat high-fiber foods every day. These foods may help prevent or relieve constipation. High-fiber foods include: ? Whole grain cereals and breads. ? Brown rice. ? Beans. ? Fresh fruits and vegetables. Activity  Return to your normal activities as told by your health care provider. Ask your health care provider what activities are safe for you.  Rest as much as possible. Try to rest or take a nap when your baby is sleeping.  Do not lift anything that is heavier than your baby or 10 lb (4.5 kg) until your health care provider says that it is safe.  Talk with your health care provider about when you can engage in sexual activity. This may depend on your: ? Risk of infection. ? Rate of healing. ? Comfort and desire to engage in sexual activity. Vaginal Care  If you have an episiotomy or a vaginal tear, check the area every day for signs of infection. Check for: ? More redness, swelling, or pain. ? More fluid or blood. ? Warmth. ? Pus or a bad smell.  Do not use tampons or douches until your health care provider says this is safe.  Watch for any blood clots that may pass from your vagina. These may look like clumps of dark red, brown, or black discharge. General instructions  Keep your perineum clean and dry as told by your health care provider.  Wear loose, comfortable clothing.  Wipe from front to back when you use the toilet.  Ask your health care provider if you can shower or take a bath. If  you had an episiotomy or a perineal tear during labor and delivery, your health care provider may tell you not to take baths for a certain length of time.  Wear a bra that supports your breasts and fits you well.  If possible, have someone help you with household activities and help care for your baby for at least a few days after you leave the hospital.  Keep all follow-up visits for you and your baby as told by your health care provider. This is  important. Contact a health care provider if:  You have: ? Vaginal discharge that has a bad smell. ? Difficulty urinating. ? Pain when urinating. ? A sudden increase or decrease in the frequency of your bowel movements. ? More redness, swelling, or pain around your episiotomy or vaginal tear. ? More fluid or blood coming from your episiotomy or vaginal tear. ? Pus or a bad smell coming from your episiotomy or vaginal tear. ? A fever. ? A rash. ? Little or no interest in activities you used to enjoy. ? Questions about caring for yourself or your baby.  Your episiotomy or vaginal tear feels warm to the touch.  Your episiotomy or vaginal tear is separating or does not appear to be healing.  Your breasts are painful, hard, or turn red.  You feel unusually sad or worried.  You feel nauseous or you vomit.  You pass large blood clots from your vagina. If you pass a blood clot from your vagina, save it to show to your health care provider. Do not flush blood clots down the toilet without having your health care provider look at them.  You urinate more than usual.  You are dizzy or light-headed.  You have not breastfed at all and you have not had a menstrual period for 12 weeks after delivery.  You have stopped breastfeeding and you have not had a menstrual period for 12 weeks after you stopped breastfeeding. Get help right away if:  You have: ? Pain that does not go away or does not get better with medicine. ? Chest pain. ? Difficulty breathing. ? Blurred vision or spots in your vision. ? Thoughts about hurting yourself or your baby.  You develop pain in your abdomen or in one of your legs.  You develop a severe headache.  You faint.  You bleed from your vagina so much that you fill two sanitary pads in one hour. This information is not intended to replace advice given to you by your health care provider. Make sure you discuss any questions you have with your health care  provider. Document Released: 01/18/2000 Document Revised: 07/04/2015 Document Reviewed: 02/04/2015 Elsevier Interactive Patient Education  2019 [employer].

## 2018-03-22 NOTE — Progress Notes (Signed)
PROGRESS NOTE  Jessica Carey  YBF:[id number] DOB: Oct 20, 1985 DOA: 03/17/2018 PCP: Patient, No Pcp Per  Brief Narrative: Jessica Carey is a 33 y.o. G1P1 s/p NSVD at [redacted]w[redacted]d on 2/12 complicated by pre-E, shoulder dystocia and 4th degree perineal laceration, with a history of PAF and type 2 diabetes mellitus.  Labor and delivery was complicated by fever for which ampicillin was given and rapid A Flutter. Cardiology was consulted and diltiazem gtt started, patient transferred postpartum following operative repair of laceration to [facility]. Zosyn was started given severity of laceration. Baby girl had APGARs 1, 3, transferred to NICU. D-dimer noted to be elevated and subsequent CTA chest showed no PE, mild cardiomegaly, and pulmonary GGOs, nonspecific.   Shortnes of breath and chest pain reported, also bradycardic.   Assessment & Plan: Active Problems:   Atrial fibrillation (HCC)   Gestational hypertension   Paroxysmal A-fib (HCC)   Fourth degree perineal laceration   Shoulder dystocia, delivered   Gestational diabetes mellitus (GDM) controlled on oral hypoglycemic drug, antepartum  Supraventricular tachycardia/paroxysmal atrial fibrillation/atrial flutter with RVR:  -Had not taken labetalol as prescribed by cardiology due to concerns for fetotoxicity.  -RVR possible precipitated by traumatic delivery -Diltiazem gtt has been discontinued.   -Patient is currently on Toprol 50 Mg p.o. twice daily.   - Cardiology consulted for assistance in management - CHA2DS2-VASc score is 2.  -EP team have seen the pt, underwent ablation and now bradycardic.   -Echocardiogram: EF is 50 to 55%, with pseudonormalization of the left ventricle diastolic function.    Fourth degree perineal tear: -Completed course of IV Zosyn as suggested by obstetrics and gynecology team.  Diabetes mellitus type 2: - Continue basal-bolus insulin. Hold OSU and cover with SSI instead. Decreased lantus dose initially. CBG at inpatient goal  so will not change, but continue AC/HS monitoring. - Carb-modified diet Continue to optimize.  Patient is on subcutaneous Lantus insulin 15 units at nighttime, as well as NovoLog insulin 2 units 3 times daily AC.  Blood sugar is well controlled.  HTN:  - Continue monitoring with rate control agents. - Continue to optimize.  Troponin elevation:  -Mild, downward trending consistent with demand ischemia.   Elevated d-dimer:  Negative CTA PE study.  DVT prophylaxis: SCDs Code Status: Full Family Communication:  Disposition Plan: Home once work up complete, cardiac status stabilizes, and cleared by OB.  Consultants:   OB/GYN  Cardiology  Procedures:   Operative repair of fourth degree perineal laceration 03/17/2018 by Dr. Macon Large.  Antimicrobials:  Zosyn 2/12 >> discontinued.  Subjective: Reports chest pain. No dizziness. Shortnes of breath present.   Objective: Vitals:   03/22/18 1456 03/22/18 1506 03/22/18 1511 03/22/18 1707  BP: 132/82 131/76  136/83  Pulse:      Resp: 20 (!) 21 16 12   Temp:      TempSrc:      SpO2:      Weight:      Height:        Intake/Output Summary (Last 24 hours) at 03/22/2018 1954 Last data filed at 03/22/2018 1500 Gross per 24 hour  Intake 360.21 ml  Output 500 ml  Net -139.79 ml   Filed Weights   03/20/18 0521 03/21/18 0437 03/22/18 0422  Weight: 87.3 kg 86.8 kg 85.3 kg    Gen: 33 y.o. female in no distress  Pulm: Non-labored breathing room air. Clear to auscultation laterally.  CV: S1-S2  GI: Abdomen is obese, soft nontender. Ext: Mild edema of the ankle and  the hands.   Neuro: Alert and oriented.  Patient moves all extremities.  Data Reviewed: I have personally reviewed following labs and imaging studies  CBC: Recent Labs  Lab 03/17/18 0211 03/17/18 2207 03/18/18 0335 03/19/18 0326 03/22/18 0723  WBC 11.9* 27.9* 24.3* 16.5* 14.0*  NEUTROABS  --  24.8*  --   --  10.5*  HGB 11.1* 10.5* 10.6* 10.0* 11.5*  HCT 33.8*  31.6* 32.1* 31.2* 36.0  MCV 87.1 84.3 84.5 86.9 86.7  PLT 129* 121* 122* 114* 183   Basic Metabolic Panel: Recent Labs  Lab 03/17/18 0211 03/17/18 2207 03/18/18 0335 03/19/18 0326 03/22/18 0723  NA 133* 135 139 138 138  K 3.8 3.8 4.0 3.8 3.8  CL 108 106 110 107 107  CO2 19* 18* 18* 22 23  GLUCOSE 82 214* 101* 97 97  BUN 12 7 6 14 11   CREATININE 0.65 0.84 0.77 0.94 0.69  CALCIUM 8.4* 8.3* 8.5* 8.5* 8.5*  MG  --  1.7 1.8 2.0 1.7  PHOS  --  3.6  --   --  3.5   GFR: Estimated Creatinine Clearance: 107.6 mL/min (by C-G formula based on SCr of 0.69 mg/dL). Liver Function Tests: Recent Labs  Lab 03/17/18 0211 03/17/18 2207 03/22/18 0723  AST 19 47*  --   ALT 13 19  --   ALKPHOS 144* 107  --   BILITOT 0.3 0.3  --   PROT 6.3* 5.0*  --   ALBUMIN 2.6* 1.9* 2.5*   No results for input(s): LIPASE, AMYLASE in the last 168 hours. No results for input(s): AMMONIA in the last 168 hours. Coagulation Profile: Recent Labs  Lab 03/17/18 2207  INR 1.02   Cardiac Enzymes: Recent Labs  Lab 03/17/18 2207 03/18/18 0335  TROPONINI 0.07* 0.04*   BNP (last 3 results) No results for input(s): PROBNP in the last 8760 hours. HbA1C: No results for input(s): HGBA1C in the last 72 hours. CBG: Recent Labs  Lab 03/21/18 1621 03/21/18 2110 03/22/18 0748 03/22/18 1048 03/22/18 1618  GLUCAP 104* 198* 106* 72 122*   Lipid Profile: No results for input(s): CHOL, HDL, LDLCALC, TRIG, CHOLHDL, LDLDIRECT in the last 72 hours. Thyroid Function Tests: No results for input(s): TSH, T4TOTAL, FREET4, T3FREE, THYROIDAB in the last 72 hours. Anemia Panel: No results for input(s): VITAMINB12, FOLATE, FERRITIN, TIBC, IRON, RETICCTPCT in the last 72 hours. Urine analysis:    Component Value Date/Time   COLORURINE STRAW (A) 03/17/2018 2154   APPEARANCEUR CLEAR 03/17/2018 2154   APPEARANCEUR Clear 09/02/2017 1430   LABSPEC 1.024 03/17/2018 2154   PHURINE 6.0 03/17/2018 2154   GLUCOSEU NEGATIVE  03/17/2018 2154   HGBUR SMALL (A) 03/17/2018 2154   BILIRUBINUR NEGATIVE 03/17/2018 2154   BILIRUBINUR Negative 09/02/2017 1430   KETONESUR NEGATIVE 03/17/2018 2154   PROTEINUR NEGATIVE 03/17/2018 2154   NITRITE NEGATIVE 03/17/2018 2154   LEUKOCYTESUR NEGATIVE 03/17/2018 2154   No results found for this or any previous visit (from the past 240 hour(s)).    Radiology Studies: No results found.  Scheduled Meds: . docusate sodium  100 mg Oral TID  . ibuprofen  600 mg Oral Q6H  . insulin aspart  0-15 Units Subcutaneous TID WC  . insulin aspart  0-5 Units Subcutaneous QHS  . insulin glargine  15 Units Subcutaneous QHS  . prenatal vitamin w/FE, FA  1 tablet Oral Daily  . sodium chloride flush  3 mL Intravenous Q12H  . sodium chloride flush  3 mL Intravenous Q12H  Continuous Infusions: . sodium chloride 10 mL (03/18/18 1649)  . sodium chloride       LOS: 5 days   Time spent: 25 minutes.  Lynden OxfordPranav Aralynn Brake, MD [facility] [email or web address] 03/22/2018, 7:54 PM

## 2018-03-23 LAB — GLUCOSE, CAPILLARY: Glucose-Capillary: 102 mg/dL — ABNORMAL HIGH (ref 70–99)

## 2018-03-23 MED ORDER — POLYETHYLENE GLYCOL 3350 17 G PO PACK: 17.0000 g | PACK | Freq: Every day | ORAL | 0 refills | Status: DC

## 2018-03-23 NOTE — Progress Notes (Addendum)
Progress Note  Patient Name: Jessica Carey Date of Encounter: 03/23/2018  Primary Cardiologist: Dina Rich, MD   Subjective   No CP or SOB  Inpatient Medications    Scheduled Meds: . docusate sodium  100 mg Oral TID  . ibuprofen  600 mg Oral Q6H  . insulin aspart  0-15 Units Subcutaneous TID WC  . insulin aspart  0-5 Units Subcutaneous QHS  . insulin glargine  15 Units Subcutaneous QHS  . prenatal vitamin w/FE, FA  1 tablet Oral Daily  . sodium chloride flush  3 mL Intravenous Q12H  . sodium chloride flush  3 mL Intravenous Q12H   Continuous Infusions: . sodium chloride 10 mL (03/18/18 1649)  . sodium chloride     PRN Meds: sodium chloride, sodium chloride, acetaminophen, acetaminophen, hydrOXYzine, magnesium hydroxide, ondansetron (ZOFRAN) IV, oxyCODONE, sodium chloride flush   Vital Signs    Vitals:   03/22/18 1511 03/22/18 1707 03/22/18 2032 03/23/18 0519  BP:  136/83 140/90 (!) 148/89  Pulse:   78 83  Resp: 16 12    Temp:   98.3 F (36.8 C) 98.5 F (36.9 C)  TempSrc:   Oral Oral  SpO2:   99% 93%  Weight:    85.3 kg  Height:        Intake/Output Summary (Last 24 hours) at 03/23/2018 0847 Last data filed at 03/22/2018 1500 Gross per 24 hour  Intake 120.21 ml  Output 500 ml  Net -379.79 ml   Last 3 Weights 03/23/2018 03/22/2018 03/21/2018  Weight (lbs) 187 lb 15.9 oz 188 lb 191 lb 6.4 oz  Weight (kg) 85.273 kg 85.276 kg 86.818 kg      Telemetry    SR/SB, 40's-70's occ has periods with PACs/PVCs,  - Personally Reviewed  ECG    SB 46, IcRBBB - Personally Reviewed  Physical Exam   GEN: No acute distress.   Neck: No JVD Cardiac: RRR, no murmurs, rubs, or gallops.  Respiratory: CTA b/l. GI: Soft, nontender, non-distended  MS: No edema; No deformity. Neuro:  Nonfocal  Psych: Normal affect   R IJ and R groin procedure sites are stable, no bleeding, hematoma, are soft, non-tender  Labs    Chemistry Recent Labs  Lab 03/17/18 0211  03/17/18 2207 03/18/18 0335 03/19/18 0326 03/22/18 0723  NA 133* 135 139 138 138  K 3.8 3.8 4.0 3.8 3.8  CL 108 106 110 107 107  CO2 19* 18* 18* 22 23  GLUCOSE 82 214* 101* 97 97  BUN 12 7 6 14 11   CREATININE 0.65 0.84 0.77 0.94 0.69  CALCIUM 8.4* 8.3* 8.5* 8.5* 8.5*  PROT 6.3* 5.0*  --   --   --   ALBUMIN 2.6* 1.9*  --   --  2.5*  AST 19 47*  --   --   --   ALT 13 19  --   --   --   ALKPHOS 144* 107  --   --   --   BILITOT 0.3 0.3  --   --   --   GFRNONAA >60 >60 >60 >60 >60  GFRAA >60 >60 >60 >60 >60  ANIONGAP 6 11 11 9 8      Hematology Recent Labs  Lab 03/18/18 0335 03/19/18 0326 03/22/18 0723  WBC 24.3* 16.5* 14.0*  RBC 3.80* 3.59* 4.15  HGB 10.6* 10.0* 11.5*  HCT 32.1* 31.2* 36.0  MCV 84.5 86.9 86.7  MCH 27.9 27.9 27.7  MCHC 33.0 32.1 31.9  RDW  13.6 13.9 14.0  PLT 122* 114* 183    Cardiac Enzymes Recent Labs  Lab 03/17/18 2207 03/18/18 0335  TROPONINI 0.07* 0.04*   No results for input(s): TROPIPOC in the last 168 hours.   BNPNo results for input(s): BNP, PROBNP in the last 168 hours.   DDimer  Recent Labs  Lab 03/17/18 2207  DDIMER 15.97*     Radiology    No results found.  Cardiac Studies   03/18/2018; TTE IMPRESSIONS  1. The left ventricle has low normal systolic function, with an ejection fraction of 50-55%. The cavity size was normal. There is mildly increased left ventricular wall thickness. Left ventricular diastolic Doppler parameters are consistent with  pseudonormalization.  2. The right ventricle has normal systolic function. The cavity was normal. There is no increase in right ventricular wall thickness.  3. Right atrial pressure is estimated at 8 mmHg.  4. Left atrial size was moderately dilated.  5. The mitral valve is normal in structure. There is mild thickening.  6. The tricuspid valve is normal in structure.  7. The aortic valve is tricuspid.  8. The pulmonic valve was normal in structure.  9. The inferior vena cava was  dilated in size with >50% respiratory variability. 10. Extremely limited; Low normal LV systolic function; focal wall motion abnormality cannot be excluded; moderate diastolic dysfunction; mild LVH; moderate LAE.  Patient Profile     33 y.o. female w/PMHx of DM, obesity, is a post partum female s/p vaginal delivery 03/17/18 as well as prior h/o PAF  Pt was admitted to [facility] 03/17/2018 in early labor and felt to be in new onset Pre eclampsia and required early delivery at only 38 weeks. She had vaginal delivery c/b4th degree perineal lacerationrequiring operative repair. Pt developed atrialflutterw/ RVR in the 150s andstarted on a Cardizem drip and transferred to [facility] for further management. Cardiology consulted for recommendations. Pt was back in NSR. Per history from pt, she had not been taking labetalol for palpitations due to side effects. Also states it was not working to help symptoms.   Also D-dimer was elevated but chest CT negative for PE. Echo pending. trops 0.07>>0.04, likely demand ischemia.  She transferred to [facility] 03/19/2018 for further cardiac management.   Seen in EP evaluation yesterday, Dr. Ladona Ridgel for persistent SVT  Assessment & Plan    1. Persistent SVT     S/p EPS ablation yesterday with Dr. Ladona Ridgel     SB/SR on telemetry 40's-70's, no AV block H/o PAFib,flutter In d/w Dr. Ladona Ridgel, felt to be secondary to her primary SVT, and at this juncture not need to anticoagulate  Procedure site care and activity restrictions were reviewed with the patient Routine post procedure EP follow up is in place Dr. Ladona Ridgel has seen and examined the patient OK to discharge from our perspective   [facility] will sign off.   Medication Recommendations:  none Other recommendations (labs, testing, etc):  none Follow up as an outpatient:  Is in place  For questions or updates, please contact [facility] Please consult [email or web address] for contact info under         Signed, Sheilah Pigeon, PA-C  03/23/2018, 8:47 AM    EP Attending  Patient seen and examined. Agree with above. The patient is stable today. She may be discharged home. I do not think that she will need either a beta blocker or flecainide.   Leonia Reeves.D.

## 2018-03-24 NOTE — Discharge Summary (Signed)
Physician Discharge Summary  Jessica Carey GOT:[id number] DOB: [birth year] DOA: 03/17/2018  PCP: Patient, No Pcp Per  Admit date: 03/17/2018 Discharge date: 03/24/2018  Admitted From: Home  Disposition:  Home   Recommendations for Outpatient Follow-up:  1. Follow up with PCP in 1-2 weeks 2. Please obtain BMP/CBC in one week your next doctors visit.  3. Follow up Cardiology in 3 weeks  4. Follow up with OBGYN per their recs   Home Health: None  Equipment/Devices: None Discharge Condition: Stable CODE STATUS: Full  Diet recommendation:  Regular  Brief/Interim Summary: 33 year old G1, P1 status post delivery on 2/20 ended up causing fourth degree laceration which was treated.  Her delivery course was complicated by atrial flutter.  She was started on Cardizem drip and she was transferred to [facility] for further care.  Cardiology team was consulted.  Echocardiogram showed ejection fraction 55 to 60% without significant wall motion abnormality.  Metoprolol 12.5 mg twice daily was added.  No anticoagulation due to vaginal bleeding risk but this will be revisited later in the future.  Eventually meteprolol was increased to 40m po bid. Ob recommended discontinuing Zosyn at the time of discharge. No other Abx.  Later in the day on 03/19/2018 patient continued to have significant SVT with RVR.  Cardiology was consulted and patient underwent ablation of the accessory pathway following which he did not have any acute events and she was stable for discharge.  Discharge Diagnoses:  Active Problems:   Atrial fibrillation (HCC)   Gestational hypertension   Paroxysmal A-fib (HCC)   Fourth degree perineal laceration   Shoulder dystocia, delivered   Gestational diabetes mellitus (GDM) controlled on oral hypoglycemic drug, antepartum  Supraventricular tachycardia/paroxysmal atrial fibrillation/atrial flutter with RVR:  -Had not taken labetalol as prescribed by cardiology due to concerns for  fetotoxicity.  -RVR possible precipitated by traumatic delivery -Diltiazem gtt has been discontinued.   -Patient is currently on Toprol 50 Mg p.o. twice daily.   - Cardiology consulted for assistance in management - CHA2DS2-VASc scoreis 2.  -EP team have seen the pt, underwent ablation and was bradycardic.   -Echocardiogram: EF is 50 to 55%, with pseudonormalization of the left ventricle diastolic function.    Fourth degree perineal tear: -Completed course of IV Zosyn as suggested by obstetrics and gynecology team.  Diabetes mellitus type 2: - Continue basal-bolus insulin. Hold OSU and cover with SSI instead. Decreased lantus dose initially. CBG at inpatient goal so will not change, but continue AC/HS monitoring. - Carb-modified diet Continue to optimize.  Patient is on subcutaneous Lantus insulin 15 units at nighttime, as well as NovoLog insulin 2 units 3 times daily AC.  Blood sugar is well controlled.  HTN:  - Continue monitoring with rate control agents. - Continue to optimize.  Troponin elevation:  -Mild, downward trending consistent with demand ischemia.   Elevated d-dimer:  Negative CTA PE study.  Discharge Instructions  Discharge Instructions    Diet - low sodium heart healthy   Complete by:  As directed    Increase activity slowly   Complete by:  As directed      Allergies as of 03/23/2018   No Known Allergies     Medication List    STOP taking these medications   NIFEdipine 30 MG 24 hr tablet Commonly known as:  PROCARDIA-XL/NIFEDICAL-XL   progesterone 200 MG capsule Commonly known as:  PROMETRIUM     TAKE these medications   docusate sodium 100 MG capsule Commonly known as:  COLACE Take 1 capsule (100 mg total) by mouth 2 (two) times daily.   glyBURIDE 5 MG tablet Commonly known as:  DIABETA Take 2 tablets (10 mg total) by mouth at bedtime.   insulin glargine 100 UNIT/ML injection Commonly known as:  LANTUS Inject 0.26 mLs (26 Units total)  into the skin daily. What changed:  when to take this   metFORMIN 1000 MG tablet Commonly known as:  GLUCOPHAGE Take 1 tablet (1,000 mg total) by mouth 2 (two) times daily with a meal.   PNV PRENATAL PLUS MULTIVITAMIN 27-1 MG Tabs Take 1 tablet by mouth daily.   polyethylene glycol packet Commonly known as:  MIRALAX Take 17 g by mouth daily.      Follow-up Information    [facility] Follow up.   Specialty:  Cardiology Why:  You have a hospital follow-up scheduled for 04/06/2018 at 3:30pm with Corine Shelter, one of our office's physician assistance. Please arrive 15 minutes early for recheck in.  Contact information: [address] [place] [address] [phone]       [facility] Follow up in 4 week(s).   Specialty:  Obstetrics and Gynecology Contact information: [address] [place] [address] [phone]       Marinus Maw, MD Follow up.   Specialty:  Cardiology Why:  04/21/2018 @ 3:45PM Contact information: [address] [place] [address] [phone]          No Known Allergies  You were cared for by a hospitalist during your hospital stay. If you have any questions about your discharge medications or the care you received while you were in the hospital after you are discharged, you can call the unit and asked to speak with the hospitalist on call if the hospitalist that took care of you is not available. Once you are discharged, your primary care physician will handle any further medical issues. Please note that no refills for any discharge medications will be authorized once you are discharged, as it is imperative that you return to your primary care physician (or establish a relationship with a primary care physician if you do not have one) for your aftercare needs so that they can reassess your need for medications and monitor your lab  values.  Consultations:  Cardiology  Ob   Procedures/Studies: Ct Angio Chest Pe W Or Wo Contrast  Result Date: 03/18/2018 CLINICAL DATA:  Status post vaginal delivery. Atrial fibrillation. Elevated D-dimer. EXAM: CT ANGIOGRAPHY CHEST WITH CONTRAST TECHNIQUE: Multidetector CT imaging of the chest was performed using the standard protocol during bolus administration of intravenous contrast. Multiplanar CT image reconstructions and MIPs were obtained to evaluate the vascular anatomy. CONTRAST:  ISOVUE-370 IOPAMIDOL (ISOVUE-370) INJECTION 76% COMPARISON:  None. FINDINGS: Cardiovascular: No filling defects in the pulmonary arteries to suggest pulmonary emboli. Heart is mildly enlarged. Aorta is normal caliber. Mediastinum/Nodes: No mediastinal, hilar, or axillary adenopathy. Lungs/Pleura: Low lung volumes. Ground-glass opacities in the lungs could reflect atelectasis or edema. No effusions. Upper Abdomen: Imaging into the upper abdomen shows no acute findings. Musculoskeletal: Chest wall soft tissues are unremarkable. No acute bony abnormality. Review of the MIP images confirms the above findings. IMPRESSION: Mild cardiomegaly, likely related to postpartum state. Ground-glass opacities within the lungs could reflect atelectasis or edema. No evidence of pulmonary embolus. Electronically Signed   By: Charlett Nose M.D.   On: 03/18/2018 00:33   US Ob Follow Up  Result  Date: 03/17/2018 FOLLOW UP SONOGRAM Claudie LeachRosa Bohne is in the office for a follow up sonogram for EFW/BPP. She is a 33 y.o. year old G1P0000 with Estimated Date of Delivery: 03/29/18 by early ultrasound now at  1697w0d weeks gestation. Thus far the pregnancy has been complicated by Type 2 diabetes. GESTATION: SINGLETON PRESENTATION: cephalic FETAL ACTIVITY:          Heart rate         150          The fetus is active. AMNIOTIC FLUID: The amniotic fluid volume is  normal, 9 cm. PLACENTA LOCALIZATION:  anterior GRADE 3 CERVIX: Limited view  GESTATIONAL AGE AND  BIOMETRICS: Gestational criteria: Estimated Date of Delivery: 03/29/18 by early ultrasound now at 3297w0d Previous Scans:9          BIPARIETAL DIAMETER           8.51 cm         34+2 weeks    14% HEAD CIRCUMFERENCE           30.88 cm         34+3 weeks    2.8% ABDOMINAL CIRCUMFERENCE           36.43 cm         40+2 weeks   99% FEMUR LENGTH           7.01 cm         35+6 weeks   44%                                                       AVERAGE EGA(BY THIS SCAN):  36+1 weeks                                                 ESTIMATED FETAL WEIGHT:       3349  grams, 92 % BIOPHYSICAL PROFILE:                                                                                                      COMMENTS GROSS BODY MOVEMENT                 2  TONE                2  RESPIRATIONS                2  AMNIOTIC FLUID                2  SCORE:  8/8 (Note: NST was not performed as part of this antepartum testing) ANATOMICAL SURVEY                                                                            COMMENTS CEREBRAL VENTRICLES yes normal  CHOROID PLEXUS yes normal                          FACIAL PROFILE yes normal  4 CHAMBERED HEART yes normal Limited view      DIAPHRAGM yes normal  STOMACH yes normal  RENAL REGION yes normal  BLADDER yes normal      3 VESSEL CORD yes normal              GENITALIA   female     SUSPECTED ABNORMALITIES:  EFW 92%,AC 99% QUALITY OF SCAN: Limited view TECHNICIAN COMMENTS: [place] 36 wks,cephalic,fhr 150 bpm,BPP 8/8,anterior placenta gr 3,afi 9 cm,BPP 8/8,EFW 3349 g 92%,AC 99%,limited head measurement because of fetal position A copy of this report including all images has been saved and backed up to a second source for retrieval if needed. All measures and details of the anatomical scan, placentation, fluid volume and pelvic anatomy are contained in that report. Amber Flora Lipps 03/01/2018 12:38 PM Clinical Impression and  recommendations: I have reviewed the sonogram results above, combined with the patient's current clinical course, below are my impressions and any appropriate recommendations for management based on the sonographic findings. 1.  G1P1000 Estimated Date of Delivery: 03/29/18 by serial sonographic evaluations 2.  Fetal sonographic surveillance findings: a). Normal fluid volume b). Normal antepartum fetal assessment with BPP 8/8 c). EFW 92%, extrapolates to 4100 grams at 39 weeks 3.  Normal general sonographic findings Recommend continued prenatal evaluations and care based on this sonogram and as clinically indicated from the patient's clinical course. Amaryllis Dyke Eure   US Fetal Bpp Wo Non Stress  Result Date: 03/17/2018 FOLLOW UP SONOGRAM Tiwanda Threats is in the office for a follow up sonogram for EFW/BPP. She is a 33 y.o. year old G1P0000 with Estimated Date of Delivery: 03/29/18 by early ultrasound now at  [redacted]w[redacted]d weeks gestation. Thus far the pregnancy has been complicated by Type 2 diabetes. GESTATION: SINGLETON PRESENTATION: cephalic FETAL ACTIVITY:          Heart rate         150          The fetus is active. AMNIOTIC FLUID: The amniotic fluid volume is  normal, 9 cm. PLACENTA LOCALIZATION:  anterior GRADE 3 CERVIX: Limited view GESTATIONAL AGE AND  BIOMETRICS: Gestational criteria: Estimated Date of Delivery: 03/29/18 by early ultrasound now at [redacted]w[redacted]d Previous Scans:9          BIPARIETAL DIAMETER           8.51 cm         34+2 weeks    14% HEAD CIRCUMFERENCE           30.88 cm         34+3 weeks    2.8% ABDOMINAL CIRCUMFERENCE           36.43 cm  40+2 weeks   99% FEMUR LENGTH           7.01 cm         35+6 weeks   44%                                                       AVERAGE EGA(BY THIS SCAN):  36+1 weeks                                                 ESTIMATED FETAL WEIGHT:       3349  grams, 92 % BIOPHYSICAL PROFILE:                                                                                                       COMMENTS GROSS BODY MOVEMENT                 2  TONE                2  RESPIRATIONS                2  AMNIOTIC FLUID                2                                                          SCORE:  8/8 (Note: NST was not performed as part of this antepartum testing) ANATOMICAL SURVEY                                                                            COMMENTS CEREBRAL VENTRICLES yes normal  CHOROID PLEXUS yes normal                          FACIAL PROFILE yes normal  4 CHAMBERED HEART yes normal Limited view      DIAPHRAGM yes normal  STOMACH yes normal  RENAL REGION yes normal  BLADDER yes normal      3 VESSEL CORD yes normal              GENITALIA   female     SUSPECTED ABNORMALITIES:  EFW 92%,AC 99% QUALITY OF SCAN: Limited view TECHNICIAN COMMENTS: [place] 36 wks,cephalic,fhr 150 bpm,BPP  8/8,anterior placenta gr 3,afi 9 cm,BPP 8/8,EFW 3349 g 92%,AC 99%,limited head measurement because of fetal position A copy of this report including all images has been saved and backed up to a second source for retrieval if needed. All measures and details of the anatomical scan, placentation, fluid volume and pelvic anatomy are contained in that report. Amber Flora Lipps 03/01/2018 12:38 PM Clinical Impression and recommendations: I have reviewed the sonogram results above, combined with the patient's current clinical course, below are my impressions and any appropriate recommendations for management based on the sonographic findings. 1.  G1P1000 Estimated Date of Delivery: 03/29/18 by serial sonographic evaluations 2.  Fetal sonographic surveillance findings: a). Normal fluid volume b). Normal antepartum fetal assessment with BPP 8/8 c). EFW 92%, extrapolates to 4100 grams at 39 weeks 3.  Normal general sonographic findings Recommend continued prenatal evaluations and care based on this sonogram and as clinically indicated from the patient's clinical course. Amaryllis Dyke Eure   US Fetal Bpp Wo Non Stress  Result  Date: 02/23/2018 FOLLOW UP SONOGRAM Arthelia Callicott is in the office for a follow up sonogram for BPP. She is a 33 y.o. year old G1P0000 with Estimated Date of Delivery: 03/29/18 by early ultrasound now at  [redacted]w[redacted]d weeks gestation. Thus far the pregnancy has been complicated by TYPE 2 diabetes. GESTATION:SINGLETON PRESENTATION:cephalic FETAL ACTIVITY:          Heart rate         146          The fetus is active. AMNIOTIC FLUID: The amniotic fluid volume is  normal, 12 cm. PLACENTA LOCALIZATION:  anterior GRADE 2 CERVIX: Limited view Previous Scans:8 BIOPHYSICAL PROFILE:                                                                                                      COMMENTS GROSS BODY MOVEMENT                2  TONE                2  RESPIRATIONS                2  AMNIOTIC FLUID                2                           SCORE:  8/8 (Note: NST was not performed as part of this antepartum testing)  ANATOMICAL SURVEY  COMMENTS CEREBRAL VENTRICLES yes normal  CHOROID PLEXUS yes normal                          FACIAL PROFILE yes normal  4 CHAMBERED HEART yes normal  OUTFLOW TRACTS yes normal  DIAPHRAGM yes normal  STOMACH yes normal  RENAL REGION yes normal  BLADDER yes normal      3 VESSEL CORD yes normal              GENITALIA yes normal female     SUSPECTED ABNORMALITIES:  no QUALITY OF SCAN: satisfactory TECHNICIAN COMMENTS: [place] 35 wks,cephalic,anterior placenta gr 2,afi 12 cm,fhr 146 bpm,BPP 8/8 A copy of this report including all images has been saved and backed up to a second source for retrieval if needed. All measures and details of the anatomical scan, placentation, fluid volume and pelvic anatomy are contained in that report. Amber Flora Lipps 02/22/2018 11:59 AM Clinical Impression and recommendations: I have reviewed the sonogram results above. Combined with the patient's current clinical course,  below are my impressions and any appropriate recommendations for management based on the sonographic findings: 1. Fetal wellbeing suggested by BPP 8/8. 2. Singleton cephalic presentation at 35 wk. 3. Limited cervix view but was 2 cm dilated at recent hospitalization. Will continue weekly Bpp or twice weekly NST, Growth u/w next week. Tilda Burrow, MD     Subjective:No complaints, feels better.  Wants to go home so she can be with her baby today   Discharge Exam: Vitals:   03/22/18 2032 03/23/18 0519  BP: 140/90 (!) 148/89  Pulse: 78 83  Resp:    Temp: 98.3 F (36.8 C) 98.5 F (36.9 C)  SpO2: 99% 93%   Vitals:   03/22/18 1511 03/22/18 1707 03/22/18 2032 03/23/18 0519  BP:  136/83 140/90 (!) 148/89  Pulse:   78 83  Resp: 16 12    Temp:   98.3 F (36.8 C) 98.5 F (36.9 C)  TempSrc:   Oral Oral  SpO2:   99% 93%  Weight:    85.3 kg  Height:        General: Pt is alert, awake, not in acute distress Cardiovascular: RRR, S1/S2 +, no rubs, no gallops Respiratory: CTA bilaterally, no wheezing, no rhonchi Abdominal: Soft, NT, ND, bowel sounds + Extremities: no edema, no cyanosis    The results of significant diagnostics from this hospitalization (including imaging, microbiology, ancillary and laboratory) are listed below for reference.     Microbiology: No results found for this or any previous visit (from the past 240 hour(s)).   Labs: BNP (last 3 results) No results for input(s): BNP in the last 8760 hours. Basic Metabolic Panel: Recent Labs  Lab 03/17/18 0211 03/17/18 2207 03/18/18 0335 03/19/18 0326 03/22/18 0723  NA 133* 135 139 138 138  K 3.8 3.8 4.0 3.8 3.8  CL 108 106 110 107 107  CO2 19* 18* 18* 22 23  GLUCOSE 82 214* 101* 97 97  BUN 12 7 6 14 11   CREATININE 0.65 0.84 0.77 0.94 0.69  CALCIUM 8.4* 8.3* 8.5* 8.5* 8.5*  MG  --  1.7 1.8 2.0 1.7  PHOS  --  3.6  --   --  3.5   Liver Function Tests: Recent Labs  Lab 03/17/18 0211 03/17/18 2207  03/22/18 0723  AST 19 47*  --   ALT 13 19  --   ALKPHOS 144* 107  --  BILITOT 0.3 0.3  --   PROT 6.3* 5.0*  --   ALBUMIN 2.6* 1.9* 2.5*   No results for input(s): LIPASE, AMYLASE in the last 168 hours. No results for input(s): AMMONIA in the last 168 hours. CBC: Recent Labs  Lab 03/17/18 0211 03/17/18 2207 03/18/18 0335 03/19/18 0326 03/22/18 0723  WBC 11.9* 27.9* 24.3* 16.5* 14.0*  NEUTROABS  --  24.8*  --   --  10.5*  HGB 11.1* 10.5* 10.6* 10.0* 11.5*  HCT 33.8* 31.6* 32.1* 31.2* 36.0  MCV 87.1 84.3 84.5 86.9 86.7  PLT 129* 121* 122* 114* 183   Cardiac Enzymes: Recent Labs  Lab 03/17/18 2207 03/18/18 0335  TROPONINI 0.07* 0.04*   BNP: Invalid input(s): POCBNP CBG: Recent Labs  Lab 03/22/18 0748 03/22/18 1048 03/22/18 1618 03/22/18 2101 03/23/18 0745  GLUCAP 106* 72 122* 180* 102*   D-Dimer No results for input(s): DDIMER in the last 72 hours. Hgb A1c No results for input(s): HGBA1C in the last 72 hours. Lipid Profile No results for input(s): CHOL, HDL, LDLCALC, TRIG, CHOLHDL, LDLDIRECT in the last 72 hours. Thyroid function studies No results for input(s): TSH, T4TOTAL, T3FREE, THYROIDAB in the last 72 hours.  Invalid input(s): FREET3 Anemia work up No results for input(s): VITAMINB12, FOLATE, FERRITIN, TIBC, IRON, RETICCTPCT in the last 72 hours. Urinalysis    Component Value Date/Time   COLORURINE STRAW (A) 03/17/2018 2154   APPEARANCEUR CLEAR 03/17/2018 2154   APPEARANCEUR Clear 09/02/2017 1430   LABSPEC 1.024 03/17/2018 2154   PHURINE 6.0 03/17/2018 2154   GLUCOSEU NEGATIVE 03/17/2018 2154   HGBUR SMALL (A) 03/17/2018 2154   BILIRUBINUR NEGATIVE 03/17/2018 2154   BILIRUBINUR Negative 09/02/2017 1430   KETONESUR NEGATIVE 03/17/2018 2154   PROTEINUR NEGATIVE 03/17/2018 2154   NITRITE NEGATIVE 03/17/2018 2154   LEUKOCYTESUR NEGATIVE 03/17/2018 2154   Sepsis Labs Invalid input(s): PROCALCITONIN,  WBC,  LACTICIDVEN Microbiology No results  found for this or any previous visit (from the past 240 hour(s)).   Time coordinating discharge:  I have spent 35 minutes face to face with the patient and on the ward discussing the patients care, assessment, plan and disposition with other care givers. >50% of the time was devoted counseling the patient about the risks and benefits of treatment/Discharge disposition and coordinating care.   SIGNED:   Lynden Oxford, MD  [facility] 03/24/2018, 12:45 AM   If 7PM-7AM, please contact night-coverage [email or web address]

## 2018-03-26 DIAGNOSIS — R0682 Tachypnea, not elsewhere classified: Secondary | ICD-10-CM | POA: Diagnosis not present

## 2018-03-28 ENCOUNTER — Ambulatory Visit: Payer: Self-pay

## 2018-03-28 NOTE — Lactation Note (Signed)
This note was copied from a baby's chart. Lactation Consultation Note  Patient Name: Jessica Carey Date: 03/28/2018  G1p1 [name] Jessica Carey in NICU. [name] Jessica Carey now 48 days old.  Mom pumping every 4-5 hours she reports. Mom reports she  Left her pump kit at [facility] hospital  and needs another kit.  Mom reports breasts and nipples comfortable,not painful. Mom reports she has a DEBP at home but she does not get much milk with it.  Urged mom to pump at baby's bedside as much as possible.  Urged mom to increase her pumping to every 3 hours.  Urged mom to pump 8 or more times day.  Mom reports she has only been pumping when her breasts feel full.  Usually getting about an 1 oz per side.  Usually right breast makes more than left.  Mom reports tries to add some massage while pumping.  Does not have a manual pump.  Showed her how to use conversion kit as a manual breast pump. Her sister is here with her.  Urged sister to hold flanges while mom does massage and compression to help get more milk while pumping.  Urged mom to start with some massage and hand expression and end with some massage and hand expression. Discussed using DEBP for 15 minutes.  Then stopping and massing and hand expressing and then restarting DEBP for 15 minutes more.  Encouraged mom to figure out what worked best for her just to try and increase her breast pumpings even if she got less milk at a time to help keep supply up for later. Urged mom to call lactation as needed.  Moms using 24 mm flanges with pumping and they are the appropriate flange size at this time.    Maternal Data    Feeding Feeding Type: Breast Milk  LATCH Score                   Interventions    Lactation Tools Discussed/Used     Consult Status      Jessica Carey 03/28/2018, 1:33 PM

## 2018-04-05 ENCOUNTER — Other Ambulatory Visit: Payer: Self-pay

## 2018-04-05 ENCOUNTER — Ambulatory Visit (INDEPENDENT_AMBULATORY_CARE_PROVIDER_SITE_OTHER): Payer: BLUE CROSS/BLUE SHIELD | Admitting: Obstetrics & Gynecology

## 2018-04-05 ENCOUNTER — Encounter: Payer: Self-pay | Admitting: Obstetrics & Gynecology

## 2018-04-05 MED ORDER — METFORMIN HCL 1000 MG PO TABS: 1000.0000 mg | ORAL_TABLET | Freq: Two times a day (BID) | ORAL | 6 refills | Status: DC

## 2018-04-05 NOTE — Progress Notes (Signed)
Chief Complaint  Patient presents with  . Follow-up    4th degree lac      33 y.o. G1P1000 Patient's last menstrual period was 06/10/2017. The current method of family planning is none.  Outpatient Encounter Medications as of 04/05/2018  Medication Sig  . metFORMIN (GLUCOPHAGE) 1000 MG tablet Take 1 tablet (1,000 mg total) by mouth 2 (two) times daily with a meal.  . [DISCONTINUED] docusate sodium (COLACE) 100 MG capsule Take 1 capsule (100 mg total) by mouth 2 (two) times daily.  . [DISCONTINUED] glyBURIDE (DIABETA) 5 MG tablet Take 2 tablets (10 mg total) by mouth at bedtime.  . [DISCONTINUED] insulin glargine (LANTUS) 100 UNIT/ML injection Inject 0.26 mLs (26 Units total) into the skin daily. (Patient taking differently: Inject 26 Units into the skin every morning. )  . [DISCONTINUED] metFORMIN (GLUCOPHAGE) 1000 MG tablet Take 1 tablet (1,000 mg total) by mouth 2 (two) times daily with a meal.  . [DISCONTINUED] polyethylene glycol (MIRALAX) packet Take 17 g by mouth daily.  . [DISCONTINUED] Prenatal Vit-Fe Fumarate-FA (PNV PRENATAL PLUS MULTIVITAMIN) 27-1 MG TABS Take 1 tablet by mouth daily.   No facility-administered encounter medications on file as of 04/05/2018.     Subjective Pt is postpartum vaginal delivery with SD and 4th degree laceration No complaints or problems just some yellowish discharge that has improved Past Medical History:  Diagnosis Date  . Boil, breast 07/04/2013  . Diabetes (HCC) 08/04/2013  . Diabetes mellitus without complication (HCC)    glyburide and insulin  . Dysrhythmia    a-fib  . Irregular periods     Past Surgical History:  Procedure Laterality Date  . BREAST SURGERY Right    breast abcess  . ELECTROPHYSIOLOGY STUDY N/A 03/22/2018   Procedure: ELECTROPHYSIOLOGY STUDY;  Surgeon: Marinus Maw, MD;  Location: [facility] INVASIVE CV LAB;  Service: Cardiovascular;  Laterality: N/A;  . PERINEAL LACERATION REPAIR N/A 03/17/2018   Procedure: SUTURE  REPAIR PERINEAL LACERATION;  Surgeon: Tereso Newcomer, MD;  Location: [facility];  Service: Gynecology;  Laterality: N/A;  . SURGERY OF LIP    . SVT ABLATION N/A 03/22/2018   Procedure: SVT ABLATION;  Surgeon: Marinus Maw, MD;  Location: [facility] INVASIVE CV LAB;  Service: Cardiovascular;  Laterality: N/A;    OB History    Gravida  1   Para  1   Term  1   Preterm  0   AB  0   Living  0     SAB  0   TAB  0   Ectopic  0   Multiple  0   Live Births  0           No Known Allergies  Social History   Socioeconomic History  . Marital status: Single    Spouse name: Not on file  . Number of children: Not on file  . Years of education: Not on file  . Highest education level: Not on file  Occupational History  . Not on file  Social Needs  . Financial resource strain: Not hard at all  . Food insecurity:    Worry: Never true    Inability: Never true  . Transportation needs:    Medical: No    Non-medical: Not on file  Tobacco Use  . Smoking status: Never Smoker  . Smokeless tobacco: Never Used  Substance and Sexual Activity  . Alcohol use: Not Currently    Comment: occ.  Marland Kitchen  Drug use: No  . Sexual activity: Not Currently    Partners: Male    Birth control/protection: None  Lifestyle  . Physical activity:    Days per week: Not on file    Minutes per session: Not on file  . Stress: Rather much  Relationships  . Social connections:    Talks on phone: Not on file    Gets together: Not on file    Attends religious service: Not on file    Active member of club or organization: Not on file    Attends meetings of clubs or organizations: Not on file    Relationship status: Not on file  Other Topics Concern  . Not on file  Social History Narrative   ** Merged History Encounter **        Family History  Problem Relation Age of Onset  . Diabetes Mother   . Diabetes Father   . Hyperlipidemia Father   . Diabetes Sister   . Cancer Maternal Aunt         liver  . Diabetes Paternal Grandmother     Medications:       Current Outpatient Medications:  .  metFORMIN (GLUCOPHAGE) 1000 MG tablet, Take 1 tablet (1,000 mg total) by mouth 2 (two) times daily with a meal., Disp: 60 tablet, Rfl: 6  Objective Blood pressure 116/60, pulse (!) 57, height 5\' 6"  (1.676 m), weight 176 lb (79.8 kg), last menstrual period 06/10/2017, currently breastfeeding.  Normally healing vagina with good repair  Pertinent ROS   Labs or studies     Impression Diagnoses this Encounter::   ICD-10-CM   1. Fourth degree laceration of perineum during delivery, postpartum O70.3     Established relevant diagnosis(es):   Plan/Recommendations: Meds ordered this encounter  Medications  . metFORMIN (GLUCOPHAGE) 1000 MG tablet    Sig: Take 1 tablet (1,000 mg total) by mouth 2 (two) times daily with a meal.    Dispense:  60 tablet    Refill:  6    Labs or Scans Ordered: No orders of the defined types were placed in this encounter.   Management:: Continue local care, make pp appt Continue metformin 1000 BID  Follow up Return for keep appt.          All questions were answered.

## 2018-04-06 ENCOUNTER — Ambulatory Visit: Payer: BLUE CROSS/BLUE SHIELD | Admitting: Cardiology

## 2018-04-06 ENCOUNTER — Ambulatory Visit (INDEPENDENT_AMBULATORY_CARE_PROVIDER_SITE_OTHER): Payer: BLUE CROSS/BLUE SHIELD | Admitting: Cardiology

## 2018-04-06 ENCOUNTER — Encounter: Payer: Self-pay | Admitting: Cardiology

## 2018-04-06 ENCOUNTER — Other Ambulatory Visit: Payer: Self-pay

## 2018-04-06 VITALS — BP 120/80 | HR 55 | Ht 63.0 in | Wt 175.0 lb

## 2018-04-06 DIAGNOSIS — O24415 Gestational diabetes mellitus in pregnancy, controlled by oral hypoglycemic drugs: Secondary | ICD-10-CM

## 2018-04-06 DIAGNOSIS — I480 Paroxysmal atrial fibrillation: Secondary | ICD-10-CM

## 2018-04-06 NOTE — Assessment & Plan Note (Signed)
On Metformin 

## 2018-04-06 NOTE — Progress Notes (Signed)
04/06/2018 Jessica Carey   [birth year]  [id number]  Primary Physician Patient, No Pcp Per Primary Cardiologist: Dr Ladona Ridgel EP  HPI: Patient is a 33 year old female seen by Dr. Ladona Ridgel in the hospital 03/18/2018 for postpartum atrial flutter.  Patient had been seen previously by Dr. Wyline Mood in August 2019 for new onset atrial fibrillation when she was [redacted] weeks pregnant.  She converted to sinus rhythm spontaneously.  Echo showed preserved LV function with an EF of 55 to 60%.  She was not anticoagulated.  Patient had vaginal delivery 03/17/2018.  She also had preeclampsia.  She required an early delivery at 38 weeks.  She had fourth degree perineal laceration requiring surgical repair.  Postop she developed atrial flutter with RVR in the 150s.  She was started on a diltiazem drip and transferred to [facility].  She converted back to sinus rhythm.  Repeat echocardiogram done 03/18/2018 showed normal LV function with an EF of 50 to 55%.  Left atrial size was moderately dilated.  Dr. Ladona Ridgel saw the patient in consult.  She ultimately underwent SVT ablation 03/23/2018.  He did not feel anticoagulation was indicated.  The patient's heart rate was in the 50s to 60s and he did not feel she needed further medications.  Patient is in the office today for TOC follow-up.  Since discharge she notes on her watch that her heart rate sometimes goes to 130 or 120.  Sometimes it goes to 45 and she feels weak.  In the office today her EKG shows sinus rhythm with blocked PACs with a rate of 66.   Current Outpatient Medications  Medication Sig Dispense Refill  . metFORMIN (GLUCOPHAGE) 1000 MG tablet Take 1 tablet (1,000 mg total) by mouth 2 (two) times daily with a meal. 60 tablet 6   No current facility-administered medications for this visit.     No Known Allergies  Past Medical History:  Diagnosis Date  . Boil, breast 07/04/2013  . Diabetes (HCC) 08/04/2013  . Diabetes mellitus without complication (HCC)    glyburide and  insulin  . Dysrhythmia    a-fib  . Irregular periods     Social History   Socioeconomic History  . Marital status: Single    Spouse name: Not on file  . Number of children: Not on file  . Years of education: Not on file  . Highest education level: Not on file  Occupational History  . Not on file  Social Needs  . Financial resource strain: Not hard at all  . Food insecurity:    Worry: Never true    Inability: Never true  . Transportation needs:    Medical: No    Non-medical: Not on file  Tobacco Use  . Smoking status: Never Smoker  . Smokeless tobacco: Never Used  Substance and Sexual Activity  . Alcohol use: Not Currently    Comment: occ.  . Drug use: No  . Sexual activity: Not Currently    Partners: Male    Birth control/protection: None  Lifestyle  . Physical activity:    Days per week: Not on file    Minutes per session: Not on file  . Stress: Rather much  Relationships  . Social connections:    Talks on phone: Not on file    Gets together: Not on file    Attends religious service: Not on file    Active member of club or organization: Not on file    Attends meetings of clubs or organizations: Not on  file    Relationship status: Not on file  . Intimate partner violence:    Fear of current or ex partner: No    Emotionally abused: No    Physically abused: No    Forced sexual activity: No  Other Topics Concern  . Not on file  Social History Narrative   ** Merged History Encounter **         Family History  Problem Relation Age of Onset  . Diabetes Mother   . Diabetes Father   . Hyperlipidemia Father   . Diabetes Sister   . Cancer Maternal Aunt        liver  . Diabetes Paternal Grandmother      Review of Systems: General: negative for chills, fever, night sweats or weight changes.  Cardiovascular: negative for chest pain, dyspnea on exertion, edema, orthopnea, palpitations, paroxysmal nocturnal dyspnea or shortness of breath Dermatological:  negative for rash Respiratory: negative for cough or wheezing Urologic: negative for hematuria Abdominal: negative for nausea, vomiting, diarrhea, bright red blood per rectum, melena, or hematemesis Neurologic: negative for visual changes, syncope, or dizziness All other systems reviewed and are otherwise negative except as noted above.    Blood pressure 120/80, pulse (!) 55, height 5\' 3"  (1.6 m), weight 175 lb (79.4 kg), SpO2 98 %, currently breastfeeding.  General appearance: alert, cooperative, no distress and mildly obese Lungs: clear to auscultation bilaterally Heart: regular rate and rhythm Skin: Skin color, texture, turgor normal. No rashes or lesions Neurologic: Grossly normal  EKG NSR, HR 66, blocked PACs  ASSESSMENT AND PLAN:   Atrial fibrillation (HCC) S/P RFA for PSVT 03/22/2018- Dr Ladona Ridgel Echo normal LVF- mod LAE  Gestational diabetes mellitus (GDM) controlled on oral hypoglycemic drug, antepartum On Metformin   PLAN  I suggested we get a 7 day ZIO before she sees Dr Ladona Ridgel later this month.   Corine Shelter PA-C 04/06/2018 4:20 PM

## 2018-04-06 NOTE — Patient Instructions (Signed)
Medication Instructions:  Your physician recommends that you continue on your current medications as directed. Please refer to the Current Medication list given to you today. If you need a refill on your cardiac medications before your next appointment, please call your pharmacy.   Lab work: NONE  If you have labs (blood work) drawn today and your tests are completely normal, you will receive your results only by: Marland Kitchen MyChart Message (if you have MyChart) OR . A paper copy in the mail If you have any lab test that is abnormal or we need to change your treatment, we will call you to review the results.  Testing/Procedures: Your physician has recommended that you wear a 7 DAY ZIO-PATCH monitor. The Zio patch cardiac monitor continuously records heart rhythm data for up to 14 days, this is for patients being evaluated for multiple types heart rhythms. For the first 24 hours post application, please avoid getting the Zio monitor wet in the shower or by excessive sweating during exercise. After that, feel free to carry on with regular activities. Keep soaps and lotions away from the ZIO XT Patch.  This will be placed at our [facility] location - [address], [address].        Follow-Up: At [facility], you and your health needs are our priority.  As part of our continuing mission to provide you with exceptional heart care, we have created designated Provider Care Teams.  These Care Teams include your primary Cardiologist (physician) and Advanced Practice Providers (APPs -  Physician Assistants and Nurse Practitioners) who all work together to provide you with the care you need, when you need it. . FOLLOW UP AS SCHEDULED  Any Other Special Instructions Will Be Listed Below (If Applicable).

## 2018-04-06 NOTE — Assessment & Plan Note (Signed)
S/P RFA for PSVT 03/22/2018- Dr Ladona Ridgel Echo normal LVF- mod LAE

## 2018-04-08 DIAGNOSIS — I619 Nontraumatic intracerebral hemorrhage, unspecified: Secondary | ICD-10-CM | POA: Diagnosis not present

## 2018-04-09 DIAGNOSIS — Q211 Atrial septal defect: Secondary | ICD-10-CM | POA: Diagnosis not present

## 2018-04-09 DIAGNOSIS — Z00111 Health examination for newborn 8 to 28 days old: Secondary | ICD-10-CM | POA: Diagnosis not present

## 2018-04-12 ENCOUNTER — Ambulatory Visit (INDEPENDENT_AMBULATORY_CARE_PROVIDER_SITE_OTHER): Payer: BLUE CROSS/BLUE SHIELD

## 2018-04-12 DIAGNOSIS — I480 Paroxysmal atrial fibrillation: Secondary | ICD-10-CM | POA: Diagnosis not present

## 2018-04-20 ENCOUNTER — Telehealth: Payer: Self-pay

## 2018-04-20 NOTE — Telephone Encounter (Signed)
Call placed to Pt.  Left detailed message.  Advised at this time-routine follow up appointments are being cancelled to reduce possible exposure for our patients.  Advised appointment would be cancelled and Pt would be contacted to reschedule.  Advised to call office if any needs.

## 2018-04-21 ENCOUNTER — Ambulatory Visit: Payer: BLUE CROSS/BLUE SHIELD | Admitting: Internal Medicine

## 2018-04-21 ENCOUNTER — Ambulatory Visit: Payer: Self-pay | Admitting: Advanced Practice Midwife

## 2018-04-22 ENCOUNTER — Ambulatory Visit: Payer: Self-pay | Admitting: Advanced Practice Midwife

## 2018-04-22 DIAGNOSIS — I480 Paroxysmal atrial fibrillation: Secondary | ICD-10-CM | POA: Diagnosis not present

## 2018-04-30 ENCOUNTER — Telehealth: Payer: Self-pay | Admitting: Obstetrics & Gynecology

## 2018-04-30 NOTE — Telephone Encounter (Signed)
Follow Up:    Pt would like her Monitor results.

## 2018-04-30 NOTE — Telephone Encounter (Signed)
Pt aware of Restrictions/screening done °

## 2018-05-03 ENCOUNTER — Ambulatory Visit: Payer: Self-pay | Admitting: Obstetrics & Gynecology

## 2018-05-04 ENCOUNTER — Encounter: Payer: Self-pay | Admitting: Obstetrics & Gynecology

## 2018-05-04 ENCOUNTER — Ambulatory Visit (INDEPENDENT_AMBULATORY_CARE_PROVIDER_SITE_OTHER): Payer: BLUE CROSS/BLUE SHIELD | Admitting: Obstetrics & Gynecology

## 2018-05-04 ENCOUNTER — Other Ambulatory Visit: Payer: Self-pay

## 2018-05-04 NOTE — Progress Notes (Signed)
   TELEHEALTH VIRTUAL GYNECOLOGY VISIT ENCOUNTER NOTE POSTPARTUM VISIT Delivered 03/17/2018; NSVD with 4th degree  I connected with Jessica Carey on 05/04/18 at  1:30 PM EDT by telephone at home and verified that I am speaking with the correct person using two identifiers.   I discussed the limitations, risks, security and privacy concerns of performing an evaluation and management service by telephone and the availability of in person appointments. I also discussed with the patient that there may be a patient responsible charge related to this service. The patient expressed understanding and agreed to proceed.   History:  Jessica Carey is a 33 y.o. G51P1000 female being evaluated today for postpartum visit. She denies any abnormal vaginal discharge, bleeding, pelvic pain or other concerns.    Bleeding stopped 1 week or so ago, then started bleeding which may be her menses Normal bowel bladder habits No problems with 4th degree She does not want birth control at this point she is going to use condoms   Past Medical History:  Diagnosis Date  . Boil, breast 07/04/2013  . Diabetes (HCC) 08/04/2013  . Diabetes mellitus without complication (HCC)    glyburide and insulin  . Dysrhythmia    a-fib  . Irregular periods    Past Surgical History:  Procedure Laterality Date  . BREAST SURGERY Right    breast abcess  . ELECTROPHYSIOLOGY STUDY N/A 03/22/2018   Procedure: ELECTROPHYSIOLOGY STUDY;  Surgeon: Marinus Maw, MD;  Location: [facility] INVASIVE CV LAB;  Service: Cardiovascular;  Laterality: N/A;  . PERINEAL LACERATION REPAIR N/A 03/17/2018   Procedure: SUTURE REPAIR PERINEAL LACERATION;  Surgeon: Tereso Newcomer, MD;  Location: [facility];  Service: Gynecology;  Laterality: N/A;  . SURGERY OF LIP    . SVT ABLATION N/A 03/22/2018   Procedure: SVT ABLATION;  Surgeon: Marinus Maw, MD;  Location: [facility] INVASIVE CV LAB;  Service: Cardiovascular;  Laterality: N/A;   The following portions of the  patient's history were reviewed and updated as appropriate: allergies, current medications, past family history, past medical history, past social history, past surgical history and problem list.   Health Maintenance:  Normal pap and negative HRHPV on n/a.  Normal mammogram on n/a.   Review of Systems:  Pertinent items noted in HPI and remainder of comprehensive ROS otherwise negative.  Physical Exam:  Physical exam deferred due to nature of the encounter  Labs and Imaging No results found for this or any previous visit (from the past 336 hour(s)). No results found.     No orders of the defined types were placed in this encounter.   No orders of the defined types were placed in this encounter.   Assessment and Plan:     Postpartum state        I discussed the assessment and treatment plan with the patient. The patient was provided an opportunity to ask questions and all were answered. The patient agreed with the plan and demonstrated an understanding of the instructions.   The patient was advised to call back or seek an in-person evaluation/go to the ED if the symptoms worsen or if the condition fails to improve as anticipated.  I provided 15 minutes of non-face-to-face time during this encounter.   Lazaro Arms, MD Center for [facility]

## 2018-05-13 NOTE — Telephone Encounter (Signed)
Pt scheduled with Dr. Ladona Ridgel 05/14/2018 to discuss monitor results.

## 2018-05-14 ENCOUNTER — Telehealth (INDEPENDENT_AMBULATORY_CARE_PROVIDER_SITE_OTHER): Payer: BLUE CROSS/BLUE SHIELD | Admitting: Internal Medicine

## 2018-05-14 ENCOUNTER — Encounter: Payer: Self-pay | Admitting: Internal Medicine

## 2018-05-14 DIAGNOSIS — I480 Paroxysmal atrial fibrillation: Secondary | ICD-10-CM

## 2018-05-14 DIAGNOSIS — I471 Supraventricular tachycardia: Secondary | ICD-10-CM | POA: Diagnosis not present

## 2018-05-14 NOTE — Progress Notes (Signed)
Electrophysiology TeleHealth Note   Due to national recommendations of social distancing due to COVID 19, an audio/video telehealth visit is felt to be most appropriate for this patient at this time.  See MyChart message from today for the patient's consent to telehealth for [facility].   Date:  05/14/2018   ID:  Jessica LeachRosa Carey, DOB 14-Aug-1985, MRN [id number]  Location: patient's home  Provider location: [address], [place]  Evaluation Performed: Follow-up visit  PCP:  Patient, No Pcp Per  Cardiologist:  Dina RichBranch, Jonathan, MD  Electrophysiologist:  Dr Ladona Ridgelaylor  Chief Complaint:  " I feel my heart skip sometimes"  History of Present Illness:    Jessica Carey is a 33 y.o. female who presents via audio/video conferencing for a telehealth visit today. She has a h/o peripartum SVT which was fairly refractory to medical therapy and underwent EPS/RFA of a slow pathway several weeks ago. She had palpitations. She wore a cardiac monitor and had brief episodes of PAF. She also had PVC's, NSVT, and PAC's. Today, she denies chest pain, shortness of breath,  lower extremity edema, dizziness, presyncope, or syncope.  The patient is otherwise without complaint today.  The patient denies symptoms of fevers, chills, cough, or new SOB worrisome for COVID 19.  Past Medical History:  Diagnosis Date  . Boil, breast 07/04/2013  . Diabetes (HCC) 08/04/2013  . Diabetes mellitus without complication (HCC)    glyburide and insulin  . Dysrhythmia    a-fib  . Irregular periods     Past Surgical History:  Procedure Laterality Date  . BREAST SURGERY Right    breast abcess  . ELECTROPHYSIOLOGY STUDY N/A 03/22/2018   Procedure: ELECTROPHYSIOLOGY STUDY;  Surgeon: Marinus Mawaylor, Gregg W, MD;  [facility];  Service: Cardiovascular;  Laterality: N/A;  . PERINEAL LACERATION REPAIR N/A 03/17/2018   Procedure: SUTURE REPAIR PERINEAL LACERATION;  Surgeon: Tereso NewcomerAnyanwu, Ugonna A, MD;  Location: [facility];  Service: Gynecology;  Laterality: N/A;  . SURGERY OF LIP    . SVT ABLATION N/A 03/22/2018   Procedure: SVT ABLATION;  Surgeon: Marinus Mawaylor, Gregg W, MD;  [facility];  Service: Cardiovascular;  Laterality: N/A;    Current Outpatient Medications  Medication Sig Dispense Refill  . metFORMIN (GLUCOPHAGE) 1000 MG tablet Take 1 tablet (1,000 mg total) by mouth 2 (two) times daily with a meal. 60 tablet 6   No current facility-administered medications for this visit.     Allergies:   Patient has no known allergies.   Social History:  The patient  reports that she has never smoked. She has never used smokeless tobacco. She reports previous alcohol use. She reports that she does not use drugs.   Family History:  The patient's  family history includes Cancer in her maternal aunt; Diabetes in her father, mother, paternal grandmother, and sister; Hyperlipidemia in her father.   ROS:  Please see the history of present illness.   All other systems are personally reviewed and negative.    Exam:    Vital Signs:  There were no vitals taken for this visit.  Well appearing, alert and conversant, regular work of breathing,  good skin color Eyes- anicteric, neuro- grossly intact, skin- no apparent rash or lesions or cyanosis, mouth- oral mucosa is pink   Labs/Other Tests and Data Reviewed:    Recent Labs: 03/17/2018: ALT 19; TSH 1.426 03/22/2018: BUN 11; Creatinine, Ser 0.69; Hemoglobin 11.5; Magnesium 1.7; Platelets 183; Potassium  3.8; Sodium 138   Wt Readings from Last 3 Encounters:  04/06/18 175 lb (79.4 kg)  04/05/18 176 lb (79.8 kg)  03/23/18 187 lb 15.9 oz (85.3 kg)     Other studies personally reviewed: Additional studies/ records that were reviewed today include:    On my review, the patient presents with Apple Watch tracings from the past month . The tracings reveal HR's in the 40s.   Zio patch reviewed and as noted above.   ASSESSMENT & PLAN:     1.  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 2. Brief PAF - she will need to have these followed. I suspect that they were related to her atrial fib but she had very brief episodes when she wore the Zio patch. She does have DM though this may be related to her pregnancy. I will discuss anti-coagulation when I see her back. At this point she does not have enough prolonged atrial fib to recommend systemic anti-coagulation but this could change in the future. I have asked her to call us if she has more evidence of atrial fib on her Apple watch. 3. Obesity - she will need to lose weight. This would like help her DM resolve. 4. COVID 19 screen The patient denies symptoms of COVID 19 at this time.  The importance of social distancing was discussed today.  Follow-up:  3 months Next remote: n/a  Current medicines are reviewed at length with the patient today.   The patient does not have concerns regarding her medicines.  The following changes were made today:  none  Labs/ tests ordered today include: none No orders of the defined types were placed in this encounter.    Patient Risk:  after full review of this patients clinical status, I feel that they are at moderate risk at this time.  Today, I have spent 25 minutes with the patient with telehealth technology discussing the above issues. Kerney Elbe, MD  05/14/2018 10:36 AM     [facility] [address] [place] [address] [phone] (office) [phone] (fax)

## 2018-08-16 ENCOUNTER — Telehealth: Payer: Self-pay | Admitting: Internal Medicine

## 2018-08-16 NOTE — Telephone Encounter (Signed)

## 2018-08-17 ENCOUNTER — Other Ambulatory Visit: Payer: Self-pay

## 2018-08-17 ENCOUNTER — Encounter: Payer: Self-pay | Admitting: Internal Medicine

## 2018-08-17 ENCOUNTER — Ambulatory Visit (INDEPENDENT_AMBULATORY_CARE_PROVIDER_SITE_OTHER): Payer: BC Managed Care – PPO | Admitting: Internal Medicine

## 2018-08-17 VITALS — BP 110/68 | HR 66 | Ht 63.0 in | Wt 174.6 lb

## 2018-08-17 DIAGNOSIS — I471 Supraventricular tachycardia: Secondary | ICD-10-CM

## 2018-08-17 DIAGNOSIS — I480 Paroxysmal atrial fibrillation: Secondary | ICD-10-CM

## 2018-08-17 NOTE — Patient Instructions (Addendum)

## 2018-08-17 NOTE — Progress Notes (Signed)
HPI [name] returns today for followup. She is a pleasant 33 yo woman with SVT who underwent catheter ablation several months ago. She had recurrent palpitations which appeared to be due to brief paroxysms of atrial tachcardia. She has had an improvement in these symptoms. She denies chest pain or sob. She is consuming a single caffeine drink a day. She is still breast feeding. She notes occaisional palpitations which last seconds.  whe admits to being told that she is snoring. No Known Allergies   Current Outpatient Medications  Medication Sig Dispense Refill  . metFORMIN (GLUCOPHAGE) 1000 MG tablet Take 1 tablet (1,000 mg total) by mouth 2 (two) times daily with a meal. 60 tablet 6  . Prenatal Vit-Fe Fumarate-FA (PRENATAL VITAMIN PLUS LOW IRON) 27-1 MG TABS Take 1 tablet by mouth daily.     No current facility-administered medications for this visit.      Past Medical History:  Diagnosis Date  . Boil, breast 07/04/2013  . Diabetes (HCC) 08/04/2013  . Diabetes mellitus without complication (HCC)    glyburide and insulin  . Dysrhythmia    a-fib  . Irregular periods     ROS:   All systems reviewed and negative except as noted in the HPI.   Past Surgical History:  Procedure Laterality Date  . BREAST SURGERY Right    breast abcess  . ELECTROPHYSIOLOGY STUDY N/A 03/22/2018   Procedure: ELECTROPHYSIOLOGY STUDY;  Surgeon: Marinus Mawaylor, Abby Stines W, MD;  [facility];  Service: Cardiovascular;  Laterality: N/A;  . PERINEAL LACERATION REPAIR N/A 03/17/2018   Procedure: SUTURE REPAIR PERINEAL LACERATION;  Surgeon: Tereso NewcomerAnyanwu, Ugonna A, MD;  Location: [facility];  Service: Gynecology;  Laterality: N/A;  . SURGERY OF LIP    . SVT ABLATION N/A 03/22/2018   Procedure: SVT ABLATION;  Surgeon: Marinus Mawaylor, Brynnan Rodenbaugh W, MD;  [facility];  Service: Cardiovascular;  Laterality: N/A;     Family History  Problem Relation Age of Onset  . Diabetes Mother   .  Diabetes Father   . Hyperlipidemia Father   . Diabetes Sister   . Cancer Maternal Aunt        liver  . Diabetes Paternal Grandmother      Social History   Socioeconomic History  . Marital status: Single    Spouse name: Not on file  . Number of children: Not on file  . Years of education: Not on file  . Highest education level: Not on file  Occupational History  . Not on file  Social Needs  . Financial resource strain: Not hard at all  . Food insecurity    Worry: Never true    Inability: Never true  . Transportation needs    Medical: No    Non-medical: Not on file  Tobacco Use  . Smoking status: Never Smoker  . Smokeless tobacco: Never Used  Substance and Sexual Activity  . Alcohol use: Not Currently    Comment: occ.  . Drug use: No  . Sexual activity: Not Currently    Partners: Male    Birth control/protection: None  Lifestyle  . Physical activity    Days per week: Not on file    Minutes per session: Not on file  . Stress: Rather much  Relationships  . Social Musicianconnections    Talks on phone: Not on file    Gets together: Not on file    Attends religious service: Not on file  Active member of club or organization: Not on file    Attends meetings of clubs or organizations: Not on file    Relationship status: Not on file  . Intimate partner violence    Fear of current or ex partner: No    Emotionally abused: No    Physically abused: No    Forced sexual activity: No  Other Topics Concern  . Not on file  Social History Narrative   ** Merged History Encounter **         BP 110/68   Pulse 66   Ht 5\' 3"  (1.6 m)   Wt 174 lb 9.6 oz (79.2 kg)   SpO2 98%   BMI 30.93 kg/m   Physical Exam:  Well appearing NAD HEENT: Unremarkable Neck:  6 cm JVD, no thyromegally Lymphatics:  No adenopathy Back:  No CVA tenderness Lungs:  Clear with no wheezes HEART:  Regular rate rhythm, no murmurs, no rubs, no clicks Abd:  soft, positive bowel sounds, no  organomegally, no rebound, no guarding Ext:  2 plus pulses, no edema, no cyanosis, no clubbing Skin:  No rashes no nodules Neuro:  CN II through XII intact, motor grossly intact  EKG - nsr with PAC's  Assess/Plan: 1. Palpitations - these are much improved. I encouraged the patient to avoid caffeine and ETOH.  2. Snoring - unclear if she has sleep apnea. I encouraged her to lose 15 lbs. If the snoring persists, then she will need a sleep study.  3. SVT - she is s/p ablation of AVNRT. She will call us if she has recurrent symptoms. Her ILR did show some PAC's and NS atrial tachycardia.   Mikle Bosworth.D.

## 2018-11-18 ENCOUNTER — Other Ambulatory Visit: Payer: Self-pay | Admitting: *Deleted

## 2018-11-18 MED ORDER — PRENATAL VITAMIN PLUS LOW IRON 27-1 MG PO TABS: 1.0000 | ORAL_TABLET | Freq: Every day | ORAL | 11 refills | Status: DC

## 2018-11-18 NOTE — Telephone Encounter (Signed)
Pt requesting refill on her PNV. Wants to keep taking it since she is breastfeeding.

## 2019-01-25 DIAGNOSIS — Z0000 Encounter for general adult medical examination without abnormal findings: Secondary | ICD-10-CM | POA: Diagnosis not present

## 2019-01-25 DIAGNOSIS — I499 Cardiac arrhythmia, unspecified: Secondary | ICD-10-CM | POA: Diagnosis not present

## 2019-01-25 DIAGNOSIS — E559 Vitamin D deficiency, unspecified: Secondary | ICD-10-CM | POA: Diagnosis not present

## 2019-01-25 DIAGNOSIS — R7309 Other abnormal glucose: Secondary | ICD-10-CM | POA: Diagnosis not present

## 2019-02-01 DIAGNOSIS — O24410 Gestational diabetes mellitus in pregnancy, diet controlled: Secondary | ICD-10-CM | POA: Diagnosis not present

## 2019-02-01 DIAGNOSIS — E559 Vitamin D deficiency, unspecified: Secondary | ICD-10-CM | POA: Diagnosis not present

## 2019-02-01 DIAGNOSIS — E785 Hyperlipidemia, unspecified: Secondary | ICD-10-CM | POA: Diagnosis not present

## 2019-06-30 ENCOUNTER — Other Ambulatory Visit: Payer: Self-pay

## 2019-06-30 ENCOUNTER — Ambulatory Visit: Payer: BC Managed Care – PPO | Attending: Internal Medicine

## 2019-06-30 DIAGNOSIS — Z20822 Contact with and (suspected) exposure to covid-19: Secondary | ICD-10-CM

## 2019-07-01 LAB — SARS-COV-2, NAA 2 DAY TAT

## 2019-07-01 LAB — NOVEL CORONAVIRUS, NAA: SARS-CoV-2, NAA: NOT DETECTED

## 2019-11-08 DIAGNOSIS — Z20828 Contact with and (suspected) exposure to other viral communicable diseases: Secondary | ICD-10-CM | POA: Diagnosis not present

## 2020-05-24 IMAGING — CT CT ANGIO CHEST
2 of 7 series · 18 of 46 positions shown · IV contrast (iopamidol)
Comparison: None.

CLINICAL DATA: Status post vaginal delivery. Atrial fibrillation.
Elevated D-dimer.

EXAM:
CT ANGIOGRAPHY CHEST WITH CONTRAST
TECHNIQUE: Multidetector CT imaging of the chest was performed using the
standard protocol during bolus administration of intravenous
contrast. Multiplanar CT image reconstructions and MIPs were
obtained to evaluate the vascular anatomy.
CONTRAST:  100mL 2OBBYW-P7S IOPAMIDOL (2OBBYW-P7S) INJECTION 76%

[Series 7: thins · axial · 0.89mm/px · z∈[+118,+370]mm · 15 of 407 slices shown]
[im 23/407  lung]
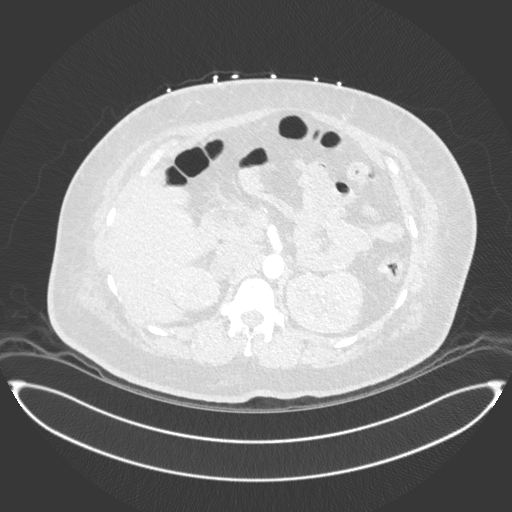
[im 46/407  soft-tissue]
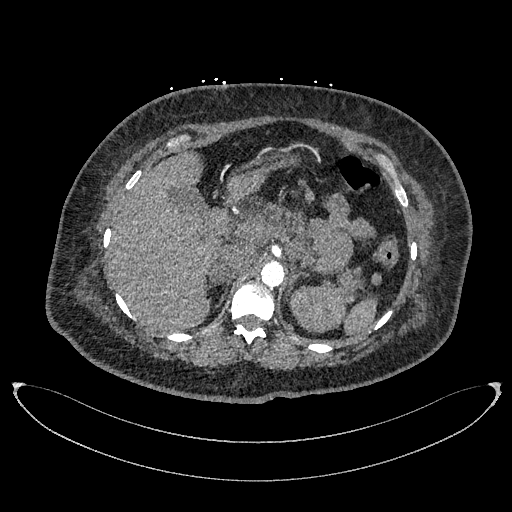
[im 68/407  lung]
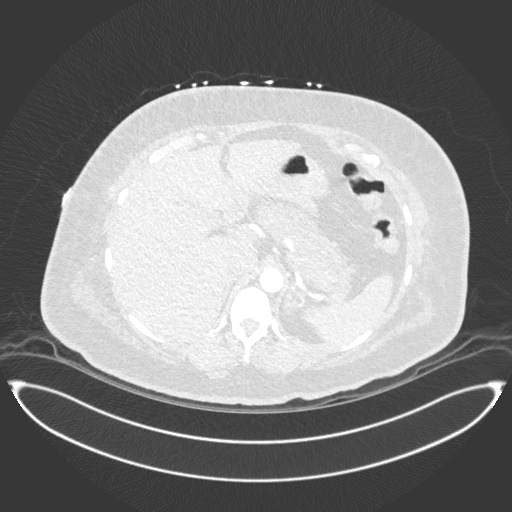
[im 91/407  soft-tissue]
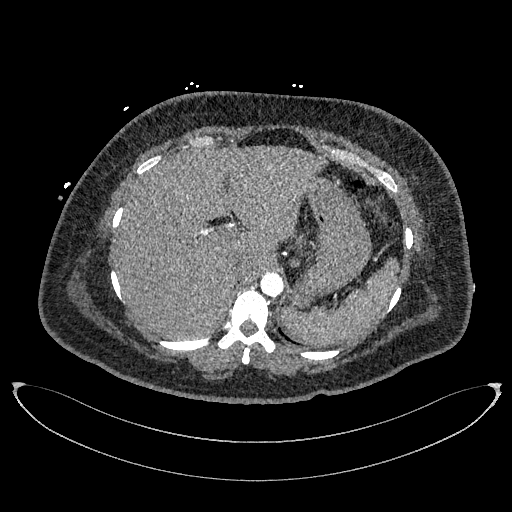
[im 136/407  lung]
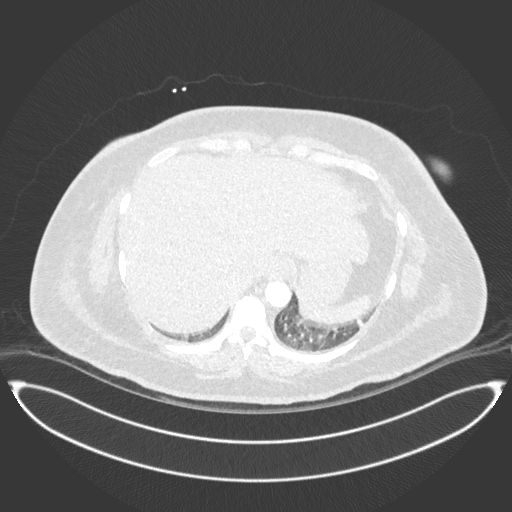
[im 158/407  soft-tissue]
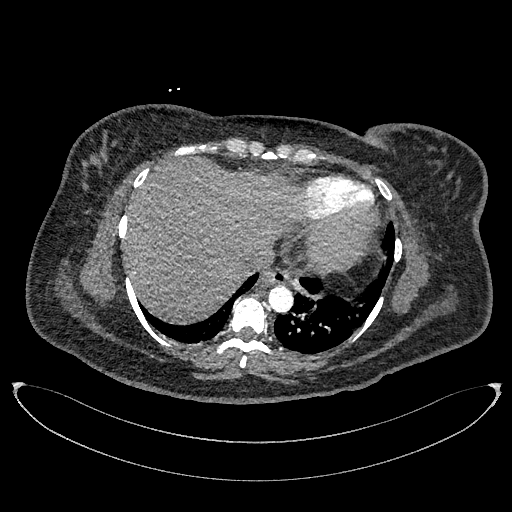
[im 181/407  lung]
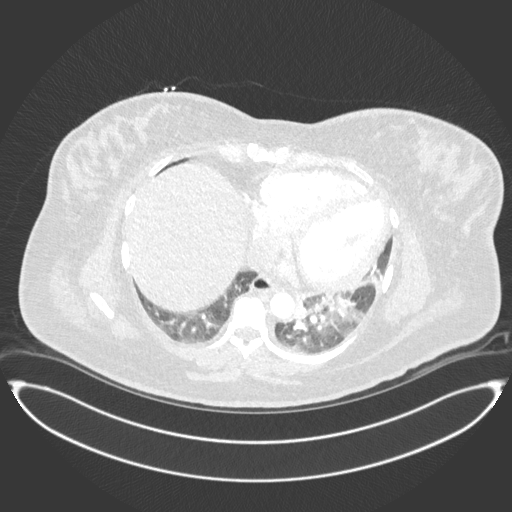
[im 204/407  soft-tissue]
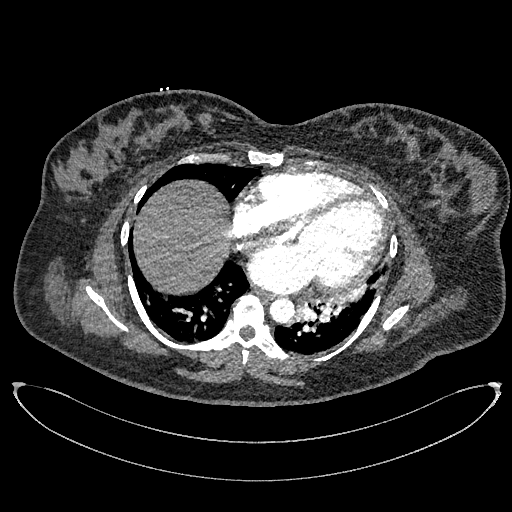
[im 226/407  lung]
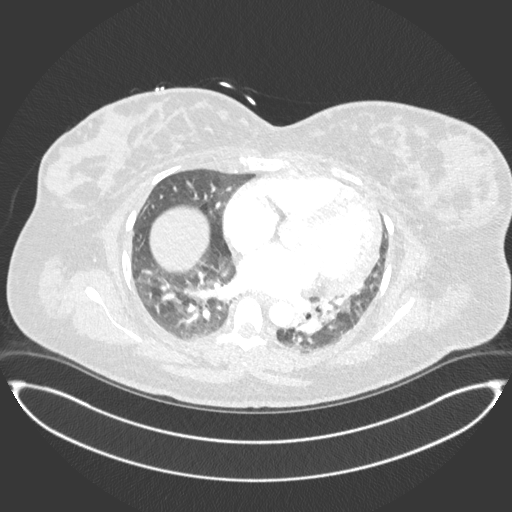
[im 249/407  soft-tissue]
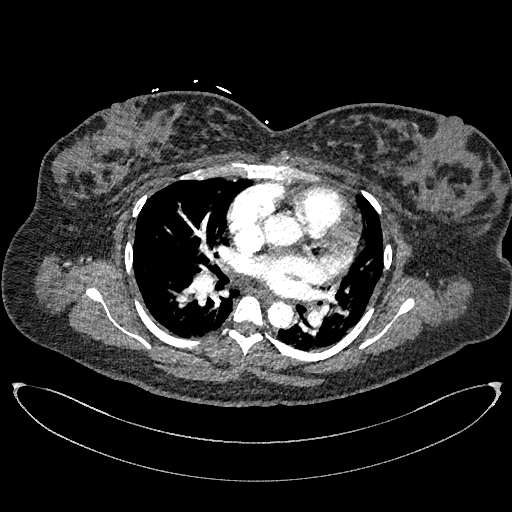
[im 271/407  lung]
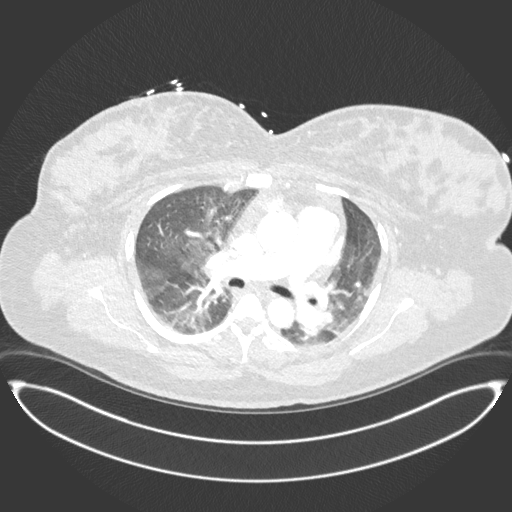
[im 316/407  soft-tissue]
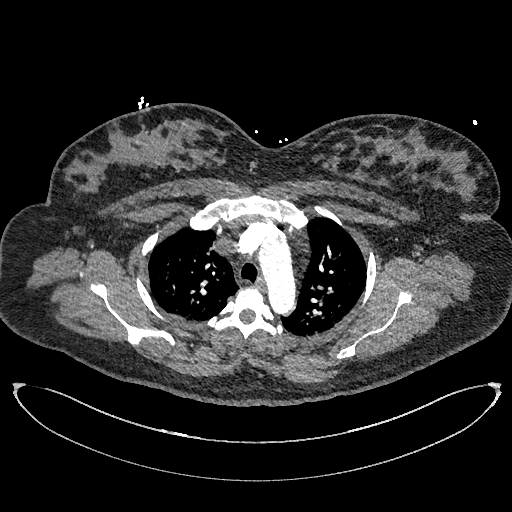
[im 339/407  lung]
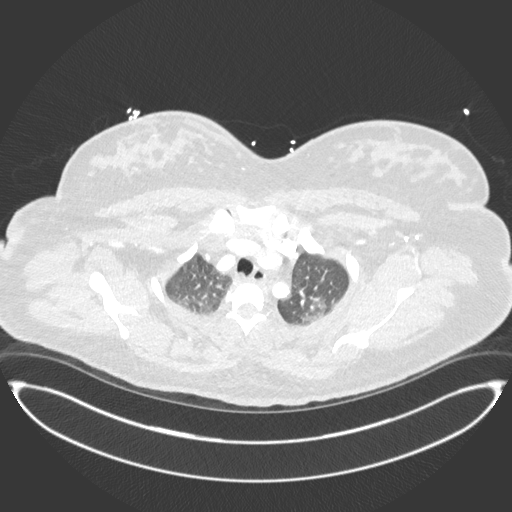
[im 361/407  soft-tissue]
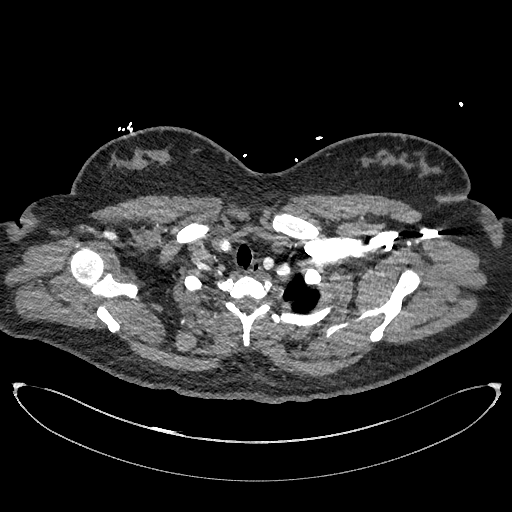
[im 384/407  lung]
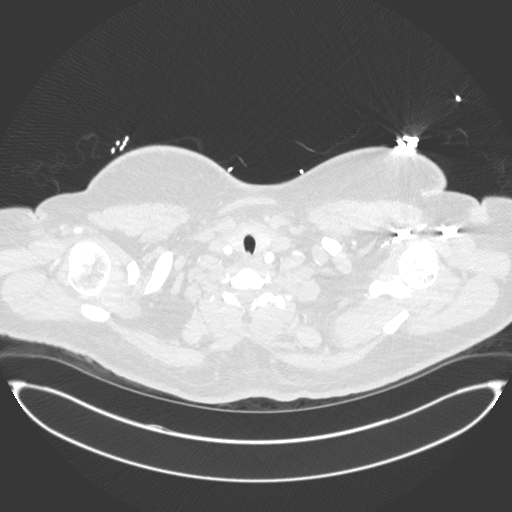

[Series 8: cor · coronal · 0.57mm/px · 3 of 155 slices shown]
[im 39/155  soft-tissue]
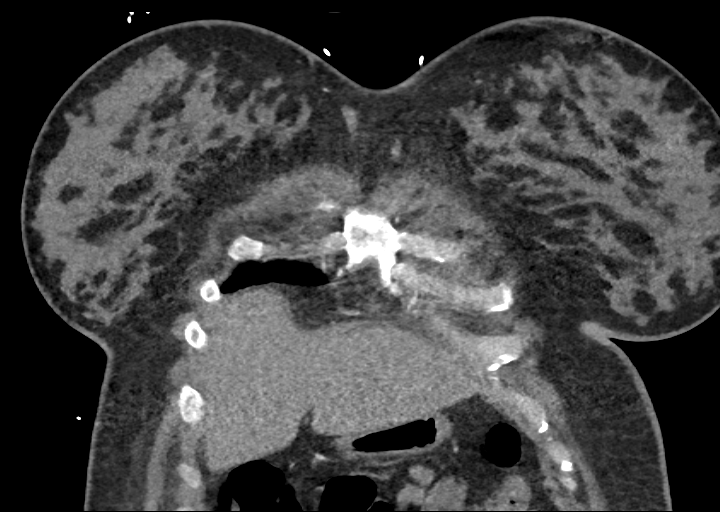
[im 78/155  soft-tissue]
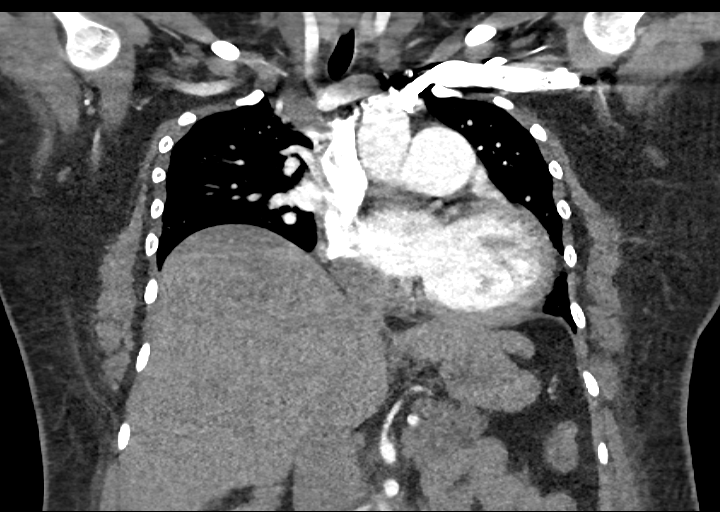
[im 116/155  soft-tissue]
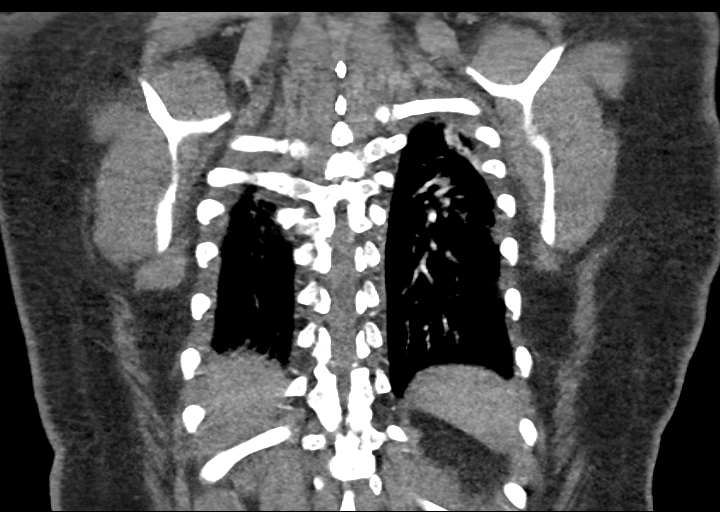

[18 of 46 positions shown; findings below may reference images not displayed]

FINDINGS: Cardiovascular: No filling defects in the pulmonary arteries to
suggest pulmonary emboli. Heart is mildly enlarged. Aorta is normal
caliber.

Mediastinum/Nodes: No mediastinal, hilar, or axillary adenopathy.

Lungs/Pleura: Low lung volumes. Ground-glass opacities in the lungs
could reflect atelectasis or edema. No effusions.

Upper Abdomen: Imaging into the upper abdomen shows no acute
findings.

Musculoskeletal: Chest wall soft tissues are unremarkable. No acute
bony abnormality.

Review of the MIP images confirms the above findings.
IMPRESSION: Mild cardiomegaly, likely related to postpartum state.

Ground-glass opacities within the lungs could reflect atelectasis or
edema.

No evidence of pulmonary embolus.

## 2020-09-14 ENCOUNTER — Ambulatory Visit (INDEPENDENT_AMBULATORY_CARE_PROVIDER_SITE_OTHER): Payer: 59 | Admitting: Internal Medicine

## 2020-09-14 ENCOUNTER — Other Ambulatory Visit: Payer: Self-pay

## 2020-09-14 DIAGNOSIS — R002 Palpitations: Secondary | ICD-10-CM | POA: Diagnosis not present

## 2020-09-14 MED ORDER — METOPROLOL TARTRATE 25 MG PO TABS: ORAL_TABLET | ORAL | 11 refills | Status: DC

## 2020-09-14 NOTE — Progress Notes (Signed)
HPI Jessica Carey returns today for followup. She is a pleasant 35 yo woman with SVT who underwent catheter ablation a couple of years ago. She had recurrent palpitations which appeared to be due to brief paroxysms of atrial tachcardia. She had an improvement in these symptoms but when she is tired they will recur. She denies chest pain or sob. She is consuming a single caffeine drink a day occaisionally more.  No Known Allergies   Current Outpatient Medications  Medication Sig Dispense Refill   metFORMIN (GLUCOPHAGE) 1000 MG tablet Take 1 tablet (1,000 mg total) by mouth 2 (two) times daily with a meal. 60 tablet 6   Prenatal Vit-Fe Fumarate-FA (PRENATAL VITAMIN PLUS LOW IRON) 27-1 MG TABS Take 1 tablet by mouth daily. 30 tablet 11   No current facility-administered medications for this visit.     Past Medical History:  Diagnosis Date   Boil, breast 07/04/2013   Diabetes (HCC) 08/04/2013   Diabetes mellitus without complication (HCC)    glyburide and insulin   Dysrhythmia    a-fib   Irregular periods     ROS:   All systems reviewed and negative except as noted in the HPI.   Past Surgical History:  Procedure Laterality Date   BREAST SURGERY Right    breast abcess   ELECTROPHYSIOLOGY STUDY N/A 03/22/2018   Procedure: ELECTROPHYSIOLOGY STUDY;  Surgeon: Marinus Maw, MD;  Location: [facility] INVASIVE CV LAB;  Service: Cardiovascular;  Laterality: N/A;   PERINEAL LACERATION REPAIR N/A 03/17/2018   Procedure: SUTURE REPAIR PERINEAL LACERATION;  Surgeon: Tereso Newcomer, MD;  Location: [facility];  Service: Gynecology;  Laterality: N/A;   SURGERY OF LIP     SVT ABLATION N/A 03/22/2018   Procedure: SVT ABLATION;  Surgeon: Marinus Maw, MD;  Location: [facility] INVAS[facility] CV LAB;  Service: Cardiovascular;  Laterality: N/A;     Family History  Problem Relation Age of Onset   Diabetes Mother    Diabetes Father    Hyperlipidemia Father    Diabetes Sister    Cancer Maternal  Aunt        liver   Diabetes Paternal Grandmother      Social History   Socioeconomic History   Marital status: Single    Spouse name: Not on file   Number of children: Not on file   Years of education: Not on file   Highest education level: Not on file  Occupational History   Not on file  Tobacco Use   Smoking status: Never   Smokeless tobacco: Never  Vaping Use   Vaping Use: Never used  Substance and Sexual Activity   Alcohol use: Not Currently    Comment: occ.   Drug use: No   Sexual activity: Not Currently    Partners: Male    Birth control/protection: None  Other Topics Concern   Not on file  Social History Narrative   ** Merged History Encounter **       Social Determinants of Health   Financial Resource Strain: Not on file  Food Insecurity: Not on file  Transportation Needs: Not on file  Physical Activity: Not on file  Stress: Not on file  Social Connections: Not on file  Intimate Partner Violence: Not on file     There were no vitals taken for this visit.  Physical Exam:  Well appearing NAD HEENT: Unremarkable Neck:  No JVD, no thyromegally Lymphatics:  No adenopathy Back:  No CVA tenderness  Lungs:  Clear with no wheezes HEART:  Regular rate rhythm, no murmurs, no rubs, no clicks Abd:  soft, positive bowel sounds, no organomegally, no rebound, no guarding Ext:  2 plus pulses, no edema, no cyanosis, no clubbing Skin:  No rashes no nodules Neuro:  CN II through XII intact, motor grossly intact  EKG - nsr with atrial bigeminy   Assess/Plan:  1. Palpitations - these are still present and I encouraged the patient to avoid caffeine and ETOH. I will prescribe her metoprol 25 mg prn palps. 2. Snoring - unclear if she has sleep apnea. I encouraged her to lose 15 lbs. If the snoring persists, then she will need a sleep study.  3. SVT - she is s/p ablation of AVNRT. She will call us if she has recurrent symptoms. Her ILR did show some PAC's and NS  atrial tachycardia.     Jessica Carey.D.

## 2020-09-14 NOTE — Patient Instructions (Addendum)
Medication Instructions:  Your physician has recommended you make the following change in your medication:    Metoprolol tartrate 25 mg-  Take one tablet by mouth as needed for palpitations.  You may take up to 4 tablets in a 24 hour period.   Labwork: None ordered.  Testing/Procedures: None ordered.  Follow-Up: Your physician wants you to follow-up in: 4 months with Jessica Bunting, MD   Any Other Special Instructions Will Be Listed Below (If Applicable).  If you need a refill on your cardiac medications before your next appointment, please call your pharmacy.   Metoprolol Tablets What is this medication? METOPROLOL (me TOE proe lole) treats high blood pressure. It also prevents chest pain (angina) or further damage after a heart attack. It works by lowering your blood pressure and heart rate, making it easier for your heart to pump blood to the rest of your body. It belongs to a group of medicationscalled beta blockers. This medicine may be used for other purposes; ask your health care provider orpharmacist if you have questions. COMMON BRAND NAME(S): Lopressor What should I tell my care team before I take this medication? They need to know if you have any of these conditions: Diabetes Heart or vessel disease like slow heart rate, worsening heart failure, heart block, sick sinus syndrome, or Raynaud's disease Kidney disease Liver disease Lung or breathing disease, like asthma or emphysema Pheochromocytoma Thyroid disease An unusual or allergic reaction to metoprolol, other beta blockers, medications, foods, dyes, or preservatives Pregnant or trying to get pregnant Breast-feeding How should I use this medication? Take this medication by mouth with water. Take it as directed on the prescription label at the same time every day. You can take it with or without food. You should always take it the same way. Keep taking it unless your careteam tells you to stop. Talk to your care team  about the use of this medication in children. Specialcare may be needed. Overdosage: If you think you have taken too much of this medicine contact apoison control center or emergency room at once. NOTE: This medicine is only for you. Do not share this medicine with others. What if I miss a dose? If you miss a dose, take it as soon as you can. If it is almost time for yournext dose, take only that dose. Do not take double or extra doses. What may interact with this medication? This medication may interact with the following: Certain medications for blood pressure, heart disease, irregular heartbeat Certain medications for depression like monoamine oxidase (MAO) inhibitors, fluoxetine, or paroxetine Clonidine Dobutamine Epinephrine Isoproterenol Reserpine This list may not describe all possible interactions. Give your health care provider a list of all the medicines, herbs, non-prescription drugs, or dietary supplements you use. Also tell them if you smoke, drink alcohol, or use illegaldrugs. Some items may interact with your medicine. What should I watch for while using this medication? Visit your care team for regular checks on your progress. Check your blood pressure as directed. Ask your care team what your blood pressure should be.Also, find out when you should contact them. Do not treat yourself for coughs, colds, or pain while you are using this medication without asking your care team for advice. Some medications mayincrease your blood pressure. You may get drowsy or dizzy. Do not drive, use machinery, or do anything that needs mental alertness until you know how this medication affects you. Do not stand up or sit up quickly, especially if you are  an older patient. This reduces the risk of dizzy or fainting spells. Alcohol may interfere with theeffect of this medication. Avoid alcoholic drinks. This medication may increase blood sugar. Ask your care team if changes in dietor medications are  needed if you have diabetes. What side effects may I notice from receiving this medication? Side effects that you should report to your care team as soon as possible: Allergic reactions-skin rash, itching, hives, swelling of the face, lips, tongue, or throat Heart failure-shortness of breath, swelling of the ankles, feet, or hands, sudden weight gain, unusual weakness or fatigue Low blood pressure-dizziness, feeling faint or lightheaded, blurry vision Raynaud's-cool, numb, or painful fingers or toes that may change color from pale, to blue, to red Slow heartbeat-dizziness, feeling faint or lightheaded, confusion, trouble breathing, unusual weakness or fatigue Worsening mood, feelings of depression Side effects that usually do not require medical attention (report to your careteam if they continue or are bothersome): Change in sex drive or performance Diarrhea Dizziness Fatigue Headache This list may not describe all possible side effects. Call your doctor for medical advice about side effects. You may report side effects to FDA at1-800-FDA-1088. Where should I keep my medication? Keep out of the reach of children and pets. Store at room temperature between 15 and 30 degrees C (59 and 86 degrees F). Protect from moisture. Keep the container tightly closed. Throw away any unusedmedication after the expiration date. NOTE: This sheet is a summary. It may not cover all possible information. If you have questions about this medicine, talk to your doctor, pharmacist, orhealth care provider.  2022 Elsevier/Gold Standard (2020-02-23 13:08:14)

## 2020-12-17 ENCOUNTER — Ambulatory Visit (INDEPENDENT_AMBULATORY_CARE_PROVIDER_SITE_OTHER): Payer: 59 | Admitting: Adult Health

## 2020-12-17 ENCOUNTER — Other Ambulatory Visit (HOSPITAL_COMMUNITY)
Admission: RE | Admit: 2020-12-17 | Discharge: 2020-12-17 | Disposition: A | Discharge status: Home / Self Care | Payer: 59 | Source: Ambulatory Visit | Attending: Adult Health | Admitting: Adult Health

## 2020-12-17 ENCOUNTER — Other Ambulatory Visit: Payer: Self-pay

## 2020-12-17 ENCOUNTER — Encounter: Payer: Self-pay | Admitting: Adult Health

## 2020-12-17 VITALS — BP 97/58 | HR 74 | Ht 66.0 in | Wt 172.8 lb

## 2020-12-17 DIAGNOSIS — E119 Type 2 diabetes mellitus without complications: Secondary | ICD-10-CM

## 2020-12-17 DIAGNOSIS — Z124 Encounter for screening for malignant neoplasm of cervix: Secondary | ICD-10-CM | POA: Diagnosis present

## 2020-12-17 DIAGNOSIS — Z1329 Encounter for screening for other suspected endocrine disorder: Secondary | ICD-10-CM

## 2020-12-17 DIAGNOSIS — Z130 Encounter for screening for diseases of the blood and blood-forming organs and certain disorders involving the immune mechanism: Secondary | ICD-10-CM

## 2020-12-17 DIAGNOSIS — Z13220 Encounter for screening for lipoid disorders: Secondary | ICD-10-CM | POA: Diagnosis not present

## 2020-12-17 NOTE — Progress Notes (Signed)
Patient ID: Jessica Carey, female   DOB: May 15, 1985, 35 y.o.   MRN: [id number] History of Present Illness: Jessica Carey is a 35 year old Hispanic female,single, G1P1 in wanting to check on diabetes, no meds since pregnant. And she needs a pap. She moved and has no glucometer, now.    Current Medications, Allergies, Past Medical History, Past Surgical History, Family History and Social History were reviewed in [name].     Review of Systems: Patient denies any headaches, hearing loss, fatigue, blurred vision, shortness of breath, chest pain, abdominal pain, problems with bowel movements, urination, or intercourse. No joint pain or mood swings. Feels hot at times Works third shift.    Physical Exam:Blood pressure (!) 97/58, pulse 74, height 5\' 6"  (1.676 m), weight 172 lb 12.8 oz (78.4 kg), last menstrual period 11/24/2020, not currently breastfeeding. hot at ts General:  Well developed, well nourished, no acute distress Skin:  Warm and dry Neck:  Midline trachea, normal thyroid, good ROM, no lymphadenopathy Lungs; Clear to auscultation bilaterally Cardiovascular: Regular rate and rhythm Pelvic:  External genitalia is normal in appearance, no lesions.  The vagina is normal in appearance. Urethra has no lesions or masses. The cervix is bulbous.Pap with GC/CHL and HR HPV genotyping performed.   Uterus is felt to be normal size, shape, and contour.  No adnexal masses or tenderness noted.Bladder is non tender, no masses felt. Extremities/musculoskeletal:  No swelling or varicosities noted, no clubbing or cyanosis Psych:  No mood changes, alert and cooperative,seems happy Fall risk is low Depression screen [facility] 2/9 12/17/2020 10/15/2017 09/02/2017  Decreased Interest 0 0 2  Down, Depressed, Hopeless 0 0 0  PHQ - 2 Score 0 0 2  Altered sleeping - - 0  Tired, decreased energy - - 0  Change in appetite - - 0  Feeling bad or failure about yourself  - - 0  Trouble  concentrating - - 0  Moving slowly or fidgety/restless - - 0  Suicidal thoughts - - 0  PHQ-9 Score - - 2  Difficult doing work/chores - - Not difficult at all        [facility] - 12/17/20 1108       Pregnancy Intention Screening   Does the patient want to become pregnant in the next year? No    Does the patient's partner want to become pregnant in the next year? No    Would the patient like to discuss contraceptive options today? No      Contraception Wrap Up   Current Method Vasectomy    End Method Vasectomy    Contraception Counseling Provided No           .Examination chaperoned by 12/19/20 LPN   Impression and Plan: 1. Routine cervical smear Pap sent Pap in 3 years if normal - Cytology - PAP( [facility])  2. Type 2 diabetes mellitus without complication, without long-term current use of insulin (HCC) Will check labs today and talk when results back  - Comprehensive metabolic panel - Hemoglobin A1c  3. Screening for thyroid disorder - TSH  4. Screening cholesterol level - Lipid panel  5. Screening, anemia, deficiency, iron - CBC

## 2020-12-18 ENCOUNTER — Telehealth: Payer: Self-pay | Admitting: Adult Health

## 2020-12-18 ENCOUNTER — Encounter: Payer: Self-pay | Admitting: Adult Health

## 2020-12-18 DIAGNOSIS — E119 Type 2 diabetes mellitus without complications: Secondary | ICD-10-CM

## 2020-12-18 LAB — CBC
Hematocrit: 47 % — ABNORMAL HIGH (ref 34.0–46.6)
Hemoglobin: 15.3 g/dL (ref 11.1–15.9)
MCH: 28.3 pg (ref 26.6–33.0)
MCHC: 32.6 g/dL (ref 31.5–35.7)
MCV: 87 fL (ref 79–97)
Platelets: 221 10*3/uL (ref 150–450)
RBC: 5.41 x10E6/uL — ABNORMAL HIGH (ref 3.77–5.28)
RDW: 12.6 % (ref 11.7–15.4)
WBC: 10.8 10*3/uL (ref 3.4–10.8)

## 2020-12-18 LAB — COMPREHENSIVE METABOLIC PANEL
ALT: 60 IU/L — ABNORMAL HIGH (ref 0–32)
AST: 35 IU/L (ref 0–40)
Albumin/Globulin Ratio: 1.4 (ref 1.2–2.2)
Albumin: 4.5 g/dL (ref 3.8–4.8)
Alkaline Phosphatase: 114 IU/L (ref 44–121)
BUN/Creatinine Ratio: 28 — ABNORMAL HIGH (ref 9–23)
BUN: 17 mg/dL (ref 6–20)
Bilirubin Total: 0.4 mg/dL (ref 0.0–1.2)
CO2: 23 mmol/L (ref 20–29)
Calcium: 9.3 mg/dL (ref 8.7–10.2)
Chloride: 96 mmol/L (ref 96–106)
Creatinine, Ser: 0.6 mg/dL (ref 0.57–1.00)
Globulin, Total: 3.2 g/dL (ref 1.5–4.5)
Glucose: 326 mg/dL — ABNORMAL HIGH (ref 70–99)
Potassium: 4 mmol/L (ref 3.5–5.2)
Sodium: 136 mmol/L (ref 134–144)
Total Protein: 7.7 g/dL (ref 6.0–8.5)
eGFR: 121 mL/min/{1.73_m2} (ref >59)

## 2020-12-18 LAB — LIPID PANEL
Chol/HDL Ratio: 5.2 ratio — ABNORMAL HIGH (ref 0.0–4.4)
Cholesterol, Total: 186 mg/dL (ref 100–199)
HDL: 36 mg/dL — ABNORMAL LOW (ref >39)
LDL Chol Calc (NIH): 95 mg/dL (ref 0–99)
Triglycerides: 327 mg/dL — ABNORMAL HIGH (ref 0–149)
VLDL Cholesterol Cal: 55 mg/dL — ABNORMAL HIGH (ref 5–40)

## 2020-12-18 LAB — TSH: TSH: 1.24 u[IU]/mL (ref 0.450–4.500)

## 2020-12-18 LAB — HEMOGLOBIN A1C
Est. average glucose Bld gHb Est-mCnc: 289 mg/dL
Hgb A1c MFr Bld: 11.7 % — ABNORMAL HIGH (ref 4.8–5.6)

## 2020-12-18 MED ORDER — METFORMIN HCL 1000 MG PO TABS: 1000.0000 mg | ORAL_TABLET | Freq: Two times a day (BID) | ORAL | 6 refills | Status: DC

## 2020-12-18 MED ORDER — OMEGA 3 1000 MG PO CAPS: ORAL_CAPSULE | ORAL | Status: DC

## 2020-12-18 NOTE — Telephone Encounter (Signed)
Pt aware of labs, will rx metformin and take omega 3's and will refer to nutrition and make follow up in 3 months, try to get PCP

## 2020-12-21 LAB — CYTOLOGY - PAP
Chlamydia: NEGATIVE
Comment: NEGATIVE
Comment: NEGATIVE
Comment: NORMAL
Diagnosis: UNDETERMINED — AB
High risk HPV: NEGATIVE
Neisseria Gonorrhea: NEGATIVE

## 2020-12-24 ENCOUNTER — Encounter: Payer: Self-pay | Admitting: Adult Health

## 2020-12-24 DIAGNOSIS — R87610 Atypical squamous cells of undetermined significance on cytologic smear of cervix (ASC-US): Secondary | ICD-10-CM

## 2020-12-24 HISTORY — DX: Atypical squamous cells of undetermined significance on cytologic smear of cervix (ASC-US): R87.610

## 2021-01-07 ENCOUNTER — Ambulatory Visit: Payer: 59 | Admitting: Internal Medicine

## 2021-01-24 ENCOUNTER — Ambulatory Visit: Payer: 59 | Admitting: Nutrition

## 2021-01-29 ENCOUNTER — Ambulatory Visit: Payer: 59 | Admitting: Nutrition

## 2021-03-07 ENCOUNTER — Encounter: Payer: 59 | Attending: Adult Health | Admitting: Nutrition

## 2021-03-07 VITALS — Ht 66.0 in | Wt 181.0 lb

## 2021-03-07 DIAGNOSIS — E785 Hyperlipidemia, unspecified: Secondary | ICD-10-CM

## 2021-03-07 DIAGNOSIS — E119 Type 2 diabetes mellitus without complications: Secondary | ICD-10-CM | POA: Diagnosis present

## 2021-03-07 DIAGNOSIS — Z713 Dietary counseling and surveillance: Secondary | ICD-10-CM | POA: Insufficient documentation

## 2021-03-07 DIAGNOSIS — E663 Overweight: Secondary | ICD-10-CM

## 2021-03-07 DIAGNOSIS — E1165 Type 2 diabetes mellitus with hyperglycemia: Secondary | ICD-10-CM

## 2021-03-07 DIAGNOSIS — E1169 Type 2 diabetes mellitus with other specified complication: Secondary | ICD-10-CM

## 2021-03-07 NOTE — Patient Instructions (Signed)
Goals  Eat three meals per day at times discussed. Test blood sugar before going to work and when get off. Meal times are 10-12 pm, 2-4 am and 6-8 am. Increase lower carb vegetables Eat only 2-3 carb choices per meal. Get A1C 7% Get information on meter and testing supplies from insurance and then call OBGYN office to prescribe.

## 2021-03-07 NOTE — Progress Notes (Signed)
Medical Nutrition Therapy  Appointment Start time:  1530  Appointment End time:  1630  Primary concerns today: Dm Type 2  Referral diagnosis: E 11. 8 Preferred learning style: No preferecen . Learning readiness: Ready  TRITION ASSESSMENT  LIves with her sister.  Doesn't cook much and eats at her moms. 12 -8 shift [employer] in [place].Marland Kitchen Eating 2 meals per day. Stopped taking metfomrin doesn't want to hurt her body. Had GDM previously in pregnancy. Has been giving herself 10 units of Lantus of her moms using her mom's insulin since her BS was high. Sees PCP next week. Was looking into 70/30 insulin at [facility]. Anthropometrics  Wt Readings from Last 3 Encounters:  03/07/21 181 lb (82.1 kg)  12/17/20 172 lb 12.8 oz (78.4 kg)  09/14/20 175 lb 3.2 oz (79.5 kg)   Ht Readings from Last 3 Encounters:  03/07/21 5\' 6"  (1.676 m)  12/17/20 5\' 6"  (1.676 m)  09/14/20 5\' 6"  (1.676 m)   Body mass index is 29.21 kg/m. @BMIFA @ Facility age limit for growth percentiles is 20 years. Facility age limit for growth percentiles is 20 years.    Clinical Medical Hx: see chart Medications: Suppose to be taking Metformin mg BID. Latus 10 units-her mom's insulin. Labs:  Lab Results  Component Value Date   HGBA1C 11.7 (H) 12/17/2020   CMP Latest Ref Rng & Units 12/17/2020 03/22/2018 03/19/2018  Glucose 70 - 99 mg/dL 12/19/2020) 97 97  BUN 6 - 20 mg/dL 17 11 14   Creatinine 0.57 - 1.00 mg/dL 12/19/2020 03/24/2018 03/21/2018  Sodium 134 - 144 mmol/L 136 138 138  Potassium 3.5 - 5.2 mmol/L 4.0 3.8 3.8  Chloride 96 - 106 mmol/L 96 107 107  CO2 20 - 29 mmol/L 23 23 22   Calcium 8.7 - 10.2 mg/dL 9.3 423(N) )  Total Protein 6.0 - 8.5 g/dL 7.7 - -  Total Bilirubin 0.0 - 1.2 mg/dL 0.4 - -  Alkaline Phos 44 - 121 IU/L 114 - -  AST 0 - 40 IU/L 35 - -  ALT 0 - 32 IU/L 60(H) - -  Lipid Panel     Component Value Date/Time   CHOL 186 12/17/2020 1139   TRIG 327 (H) 12/17/2020 1139   HDL 36 (L) 12/17/2020 1139   CHOLHDL  5.2 (H) 12/17/2020 1139   CHOLHDL 5.3 07/18/2013 1055   VLDL 39 07/18/2013 1055   LDLCALC 95 12/17/2020 1139   LABVLDL 55 (H) 12/17/2020 1139     Notable Signs/Symptoms: Increased thirst, frequent urination,  yeast infection.   Lifestyle & Dietary Hx LIve with sister and eats with her mom. Has benn taking 10 units of her moms lantus insulin. When home Bs's were in  200's and now in the low 100's with taking the 10 units of lantus   Estimated daily fluid intake: 40-50  oz Supplements:  Sleep: 8 hr  Stress / self-care: worried about her dm Current average weekly physical activity: ADL  24-Hr Dietary Recall First Meal: didn't eat this am; egg 2, rice, water, tortillas 3-4 Snack:  Second Meal: 3 am 12/19/2020 and burger bun, water Snack: water Third Meal: 2 hots dog with buns, water Snack:  Beverages: Water  Estimated Energy Needs Calories: 1500 Carbohydrate: 170g Protein: 112g Fat: 42g   NUTRITION DIAGNOSIS  NB-1.1 Food and nutrition-related knowledge deficit As related to Diabetes Type 2.  As evidenced by A1C 11.7%.   NUTRITION INTERVENTION  Nutrition education (E-1) on the following topics:  Nutrition and  Diabetes education provided on My Plate, CHO counting, meal planning, portion sizes, timing of meals, avoiding snacks between meals unless having a low blood sugar, target ranges for A1C and blood sugars, signs/symptoms and treatment of hyper/hypoglycemia, monitoring blood sugars, taking medications as prescribed, benefits of exercising 30 minutes per day and prevention of complications of DM.  Lifestyle Medicine - Whole Food, Plant Predominant Nutrition is highly recommended: Eat Plenty of vegetables, Mushrooms, fruits, Legumes, Whole Grains, Nuts, seeds in lieu of processed meats, processed snacks/pastries red meat, poultry, eggs.    -It is better to avoid simple carbohydrates including: Cakes, Sweet Desserts, Ice Cream, Soda (diet and regular), Sweet Tea, Candies,  Chips, Cookies, Store Bought Juices, Alcohol in Excess of  1-2 drinks a day, Lemonade,  Artificial Sweeteners, Doughnuts, Coffee Creamers, "Sugar-free" Products, etc, etc.  This is not a complete list.....  Exercise: If you are able: 30 -60 minutes a day ,4 days a week, or 150 minutes a week.  The longer the better.  Combine stretch, strength, and aerobic activities.  If you were told in the past that you have high risk for cardiovascular diseases, you may seek evaluation by your heart doctor prior to initiating moderate to intense exercise programs.   Handouts Provided Include  Lifestyle Plant based plate method Lifestyle nutrition Meal Plan Card Calorie Density of foods  Learning Style & Readiness for Change Teaching method utilized: Visual & Auditory  Demonstrated degree of understanding via: Teach Back  Barriers to learning/adherence to lifestyle change: none  Goals Established by Pt Goals Do not take your mom's insulin. Talk to MD about your BS and the use of insulin if needed. Eat three meals per day at times discussed. Test blood sugar before going to work and when get off. Meal times are 10-12 pm, 2-4 am and 6-8 am. Increase lower carb vegetables Eat only 2-3 carb choices per meal. Get A1C 7% Get information on meter and testing supplies from insurance and then call OBGYN office to prescribe.  Lifestyle Medicine - Whole Food, Plant Predominant Nutrition is highly recommended: Eat Plenty of vegetables, Mushrooms, fruits, Legumes, Whole Grains, Nuts, seeds in lieu of processed meats, processed snacks/pastries red meat, poultry, eggs.    -It is better to avoid simple carbohydrates including: Cakes, Sweet Desserts, Ice Cream, Soda (diet and regular), Sweet Tea, Candies, Chips, Cookies, Store Bought Juices, Alcohol in Excess of  1-2 drinks a day, Lemonade,  Artificial Sweeteners, Doughnuts, Coffee Creamers, "Sugar-free" Products, etc, etc.  This is not a complete  list.....  Exercise: If you are able: 30 -60 minutes a day ,4 days a week, or 150 minutes a week.  The longer the better.  Combine stretch, strength, and aerobic activities.  If you were told in the past that you have high risk for cardiovascular diseases, you may seek evaluation by your heart doctor prior to initiating moderate to intense exercise programs.    MONITORING & EVALUATION Dietary intake, weekly physical activity, and meal planning and BS in 1 month.  Next Steps  Patient is to work on meal prepping and talking to MD about medication management of her DM. Marland Kitchen

## 2021-03-11 ENCOUNTER — Other Ambulatory Visit: Payer: Self-pay

## 2021-03-11 ENCOUNTER — Encounter: Payer: Self-pay | Admitting: Nurse Practitioner

## 2021-03-11 ENCOUNTER — Ambulatory Visit (INDEPENDENT_AMBULATORY_CARE_PROVIDER_SITE_OTHER): Payer: 59 | Admitting: Nurse Practitioner

## 2021-03-11 VITALS — BP 107/78 | HR 98 | Temp 98.4°F | Ht 66.0 in | Wt 179.2 lb

## 2021-03-11 DIAGNOSIS — E1165 Type 2 diabetes mellitus with hyperglycemia: Secondary | ICD-10-CM | POA: Diagnosis not present

## 2021-03-11 DIAGNOSIS — I480 Paroxysmal atrial fibrillation: Secondary | ICD-10-CM | POA: Diagnosis not present

## 2021-03-11 DIAGNOSIS — Z2821 Immunization not carried out because of patient refusal: Secondary | ICD-10-CM | POA: Diagnosis not present

## 2021-03-11 DIAGNOSIS — E785 Hyperlipidemia, unspecified: Secondary | ICD-10-CM

## 2021-03-11 DIAGNOSIS — E1169 Type 2 diabetes mellitus with other specified complication: Secondary | ICD-10-CM | POA: Diagnosis not present

## 2021-03-11 DIAGNOSIS — E663 Overweight: Secondary | ICD-10-CM | POA: Insufficient documentation

## 2021-03-11 MED ORDER — METFORMIN HCL 1000 MG PO TABS: 1000.0000 mg | ORAL_TABLET | Freq: Two times a day (BID) | ORAL | 6 refills | Status: DC

## 2021-03-11 MED ORDER — ATORVASTATIN CALCIUM 10 MG PO TABS: 10.0000 mg | ORAL_TABLET | Freq: Every day | ORAL | 3 refills | Status: DC

## 2021-03-11 MED ORDER — OMEGA 3 1000 MG PO CAPS: ORAL_CAPSULE | ORAL | Status: DC

## 2021-03-11 NOTE — Progress Notes (Signed)
New Patient Office Visit  Subjective:  Patient ID: Jessica Carey, female    DOB: 1985-07-19  Age: 36 y.o. MRN: [id number]  CC:  Chief Complaint  Patient presents with   New Patient (Initial Visit)    NP    HPI Jessica Carey presents to establish care. She goes to the OBGYN but has no PCP. She was recently started on metformin for T2DM. She has been taking medication as ordered. Pt stated that she stopped taking omega 3 capsules. She is currently breastfeeding her 25 year old child.   Past Medical History:  Diagnosis Date   ASCUS of cervix with negative high risk HPV 12/24/2020   12/24/20 repeat pap in 3 years per [facility],5 year risk CIN3+ is 0.40%   Boil, breast 07/04/2013   Diabetes (HCC) 08/04/2013   Diabetes mellitus without complication (HCC)    glyburide and insulin   Dysrhythmia    a-fib   Irregular periods     Past Surgical History:  Procedure Laterality Date   BREAST SURGERY Right    breast abcess   ELECTROPHYSIOLOGY STUDY N/A 03/22/2018   Procedure: ELECTROPHYSIOLOGY STUDY;  Surgeon: Marinus Maw, MD;  Location: [facility];  Service: Cardiovascular;  Laterality: N/A;   PERINEAL LACERATION REPAIR N/A 03/17/2018   Procedure: SUTURE REPAIR PERINEAL LACERATION;  Surgeon: Tereso Newcomer, MD;  Location: [facility];  Service: Gynecology;  Laterality: N/A;   SURGERY OF LIP     SVT ABLATION N/A 03/22/2018   Procedure: SVT ABLATION;  Surgeon: Marinus Maw, MD;  Location: [facility];  Service: Cardiovascular;  Laterality: N/A;    Family History  Problem Relation Age of Onset   Diabetes Paternal Grandmother    Diabetes Father    Hyperlipidemia Father    Diabetes Mother    Diabetes Sister    Cancer Maternal Aunt        liver    Social History   Socioeconomic History   Marital status: Single    Spouse name: Not on file   Number of children: Not on file   Years of education: Not on file   Highest education level: Not on file  Occupational  History   Not on file  Tobacco Use   Smoking status: Never   Smokeless tobacco: Never  Vaping Use   Vaping Use: Never used  Substance and Sexual Activity   Alcohol use: Not Currently    Comment: occ.   Drug use: No   Sexual activity: Yes    Partners: Male    Birth control/protection: Other-see comments    Comment: Vasectomy  Other Topics Concern   Not on file  Social History Narrative   ** Merged History Encounter **       Social Determinants of Health   Financial Resource Strain: Not on file  Food Insecurity: Not on file  Transportation Needs: Not on file  Physical Activity: Not on file  Stress: Not on file  Social Connections: Not on file  Intimate Partner Violence: Not on file    ROS Review of Systems  Constitutional: Negative.   Respiratory: Negative.    Cardiovascular: Negative.   Gastrointestinal: Negative.   Psychiatric/Behavioral: Negative.     Objective:   Today's Vitals: BP 107/78 (BP Location: Right Arm, Patient Position: Sitting, Cuff Size: Normal)    Pulse 98    Temp 98.4 F (36.9 C) (Oral)    Ht 5\' 6"  (1.676 m)    Wt 179 lb  2.6 oz (81.3 kg)    LMP 03/10/2021 (Exact Date)    SpO2 98%    BMI 28.92 kg/m   Physical Exam Constitutional:      General: She is not in acute distress.    Appearance: She is not ill-appearing, toxic-appearing or diaphoretic.  Cardiovascular:     Rate and Rhythm: Normal rate and regular rhythm.     Heart sounds: Normal heart sounds. No murmur heard.   No friction rub. No gallop.  Pulmonary:     Effort: Pulmonary effort is normal. No respiratory distress.     Breath sounds: Normal breath sounds. No stridor. No wheezing, rhonchi or rales.  Chest:     Chest wall: No tenderness.  Abdominal:     Palpations: Abdomen is soft.  Neurological:     Mental Status: She is alert and oriented to person, place, and time.  Psychiatric:        Mood and Affect: Mood normal.        Behavior: Behavior normal.        Thought Content:  Thought content normal.        Judgment: Judgment normal.    Assessment & Plan:   Problem List Items Addressed This Visit   None   Outpatient Encounter Medications as of 03/11/2021  Medication Sig   metFORMIN (GLUCOPHAGE) 1000 MG tablet Take 1 tablet (1,000 mg total) by mouth 2 (two) times daily with a meal.   Omega 3 1000 MG CAPS Take 1 daily (Patient not taking: Reported on 03/11/2021)   No facility-administered encounter medications on file as of 03/11/2021.    Follow-up: No follow-ups on file.   Donell Beers, FNP

## 2021-03-11 NOTE — Assessment & Plan Note (Signed)
Lab Results  Component Value Date   CHOL 186 12/17/2020   HDL 36 (L) 12/17/2020   LDLCALC 95 12/17/2020   TRIG 327 (H) 12/17/2020   CHOLHDL 5.2 (H) 12/17/2020  continue omega 3 capsule Get fasting lipid panel done prior to next visit. Not on statin due to pt lactating.

## 2021-03-11 NOTE — Assessment & Plan Note (Signed)
Lab Results  Component Value Date   HGBA1C 11.7 (H) 12/17/2020   Continue metformin 1000 mg BID Avoid sugar, soda , sweet, Not on statin due to lactating.  Takes omega 3 , 1000 capsules daily BP within goal , not on BP meds. A1C ordered, will get labs done before next visit.  Due for foot exam, diabetic eye exam, urine microalbuminuria, will get them done at her next visit.  Lab Results  Component Value Date   HGBA1C 11.7 (H) 12/17/2020

## 2021-03-11 NOTE — Patient Instructions (Addendum)
Please get your labs done 3-5 days before your next visit. ° ° °It is important that you exercise regularly at least 30 minutes 5 times a week.  °Think about what you will eat, plan ahead. °Choose " clean, green, fresh or frozen" over canned, processed or packaged foods which are more sugary, salty and fatty. °70 to 75% of food eaten should be vegetables and fruit. °Three meals at set times with snacks allowed between meals, but they must be fruit or vegetables. °Aim to eat over a 12 hour period , example 7 am to 7 pm, and STOP after  your last meal of the day. °Drink water,generally about 64 ounces per day, no other drink is as healthy. Fruit juice is best enjoyed in a healthy way, by EATING the fruit. ° °Thanks for choosing [facility], we consider it a privelige to serve you.  °

## 2021-03-11 NOTE — Assessment & Plan Note (Signed)
PAF. followed by cardiology, no complaints of palpitations today.  Currently not taking meds for this.

## 2021-03-11 NOTE — Assessment & Plan Note (Signed)
Need for vaccination discussed with pt she verbalized understanding. She stated that flu vaccines makes her sick whenever she gets the vaccine. Jessica Carey

## 2021-03-11 NOTE — Assessment & Plan Note (Signed)
Importance of healthy food choices with portion control discussed as well as eating regularly within 12  hour window.   The need to choose clean green food 50%-75% of time is discussed as well as make water the primary drink and set a goal for 64 ounces daily.  Patient reeducated about the importance of committment to minimum of 150 minutes of exercise per week.  Three meals at set times with snacks allowed between meals but they must be fruit or vegetable.   Aim to eat  over 12 hour period  for example 7 am to 7 pm. Stop after your last meal of the day.  Wt Readings from Last 3 Encounters:  03/11/21 179 lb 2.6 oz (81.3 kg)  03/07/21 181 lb (82.1 kg)  12/17/20 172 lb 12.8 oz (78.4 kg)

## 2021-03-12 ENCOUNTER — Other Ambulatory Visit: Payer: Self-pay

## 2021-03-12 MED ORDER — BLOOD GLUCOSE METER KIT: PACK | 0 refills | Status: DC

## 2021-03-28 ENCOUNTER — Encounter: Payer: Self-pay | Admitting: Nutrition

## 2021-04-05 ENCOUNTER — Other Ambulatory Visit: Payer: Self-pay | Admitting: Nurse Practitioner

## 2021-04-06 ENCOUNTER — Other Ambulatory Visit: Payer: Self-pay | Admitting: Nurse Practitioner

## 2021-04-09 ENCOUNTER — Other Ambulatory Visit: Payer: Self-pay

## 2021-04-09 ENCOUNTER — Encounter: Payer: Self-pay | Admitting: Nutrition

## 2021-04-09 ENCOUNTER — Encounter: Payer: 59 | Attending: Adult Health | Admitting: Nutrition

## 2021-04-09 VITALS — Ht 64.0 in | Wt 182.0 lb

## 2021-04-09 DIAGNOSIS — E1165 Type 2 diabetes mellitus with hyperglycemia: Secondary | ICD-10-CM | POA: Insufficient documentation

## 2021-04-09 DIAGNOSIS — E1169 Type 2 diabetes mellitus with other specified complication: Secondary | ICD-10-CM | POA: Diagnosis present

## 2021-04-09 DIAGNOSIS — E785 Hyperlipidemia, unspecified: Secondary | ICD-10-CM | POA: Diagnosis present

## 2021-04-09 DIAGNOSIS — E663 Overweight: Secondary | ICD-10-CM | POA: Insufficient documentation

## 2021-04-09 NOTE — Patient Instructions (Addendum)
Goals ? ?Walk 30-60 minutes three times per week. ?Increase fiber rich foods and try oatmeal for breakfast ?Avoid skipping meals ?Avoid smoothies. ?Lose 2 lbs per month ?Get A1C down to 7% ?Take Metformin after meals and not on empty stomach. ?

## 2021-04-09 NOTE — Progress Notes (Signed)
Medical Nutrition Therapy  ?Appointment Start time:  0900  Appointment End time:  0930 ? ?Primary concerns today: Dm Type 2  ?Referral diagnosis: E 11. 8 ?Preferred learning style: No preferecen . ?Learning readiness: Ready   ? ?NUTRITION ASSESSMENT  ?Use [employer] meter and app. ?Has a new PCP  Edwin Dada at [facility]. ?Got blood work done today. ?FBS's 125-139 in am, 160-200's in evenings. BS are much improved. Elevated BS related to skipping meals at times. ?Metformin 1000 mg BID> ?Feels much better. No more blurry vision, not as thirsty, more energy and feels a lot better. ?Per her meter, avg BS is 169 mg/dl for the last 30 days. ? ?Anthropometrics  ?Wt Readings from Last 3 Encounters:  ?03/11/21 179 lb 2.6 oz (81.3 kg)  ?03/07/21 181 lb (82.1 kg)  ?12/17/20 172 lb 12.8 oz (78.4 kg)  ? ?Ht Readings from Last 3 Encounters:  ?03/11/21 5\' 6"  (1.676 m)  ?03/07/21 5\' 6"  (1.676 m)  ?12/17/20 5\' 6"  (1.676 m)  ? ?There is no height or weight on file to calculate BMI. ?@BMIFA @ ?Facility age limit for growth percentiles is 20 years. ?Facility age limit for growth percentiles is 20 years. ?  ? ?Clinical ?Medical Hx: see chart ?Medications: Metformin mg BID. ? ?Labs:  ?Lab Results  ?Component Value Date  ? HGBA1C 11.7 (H) 12/17/2020  ? ?CMP Latest Ref Rng & Units 12/17/2020 03/22/2018 03/19/2018  ?Glucose 70 - 99 mg/dL 12/19/2020) 97 97  ?BUN 6 - 20 mg/dL 17 11 14   ?Creatinine 0.57 - 1.00 mg/dL 12/19/2020 03/24/2018 03/21/2018  ?Sodium 134 - 144 mmol/L 136 138 138  ?Potassium 3.5 - 5.2 mmol/L 4.0 3.8 3.8  ?Chloride 96 - 106 mmol/L 96 107 107  ?CO2 20 - 29 mmol/L 23 23 22   ?Calcium 8.7 - 10.2 mg/dL 9.3 397(Q) )  ?Total Protein 6.0 - 8.5 g/dL 7.7 - -  ?Total Bilirubin 0.0 - 1.2 mg/dL 0.4 - -  ?Alkaline Phos 44 - 121 IU/L 114 - -  ?AST 0 - 40 IU/L 35 - -  ?ALT 0 - 32 IU/L 60(H) - -  ?Lipid Panel  ?   ?Component Value Date/Time  ? CHOL 186 12/17/2020 1139  ? TRIG 327 (H) 12/17/2020 1139  ? HDL 36 (L) 12/17/2020 1139   ? CHOLHDL 5.2 (H) 12/17/2020 1139  ? CHOLHDL 5.3 07/18/2013 1055  ? VLDL 39 07/18/2013 1055  ? LDLCALC 95 12/17/2020 1139  ? LABVLDL 55 (H) 12/17/2020 1139  ? ? ? ?Notable Signs/Symptoms: Increased thirst, frequent urination,  yeast infection. ? ? ?Lifestyle & Dietary Hx ?LIve with sister and eats with her mom. Has been taking 10 units of her moms lantus insulin. ?Works 12 am to 8 am . ? ? ?Estimated daily fluid intake: 60+  oz ?Supplements:  ?Sleep: 8 hr  ?Stress / self-care: worried about her dm ?Current average weekly physical activity: ADL ? ?24-Hr Dietary Recall ?First Meal: Eggs,with spinach, 2-3 toast, avacado ?Snack:  ?Second Meal: Chicken salad from 12/19/2020 with ranch, water ?Snack: water ?Third Meal: Fried eggs and toast or green smoothie,  ?Snack:  ?Beverages: Water ? ?Estimated Energy Needs ?Calories: 1500 ?Carbohydrate: 170g ?Protein: 112g ?Fat: 42g ? ? ?NUTRITION DIAGNOSIS  ?NB-1.1 Food and nutrition-related knowledge deficit As related to Diabetes Type 2.  As evidenced by A1C 11.7%. ? ? ?NUTRITION INTERVENTION  ?Nutrition education (E-1) on the following topics:  ?Nutrition and Diabetes education provided on My Plate, CHO counting, meal planning,  portion sizes, timing of meals, avoiding snacks between meals unless having a low blood sugar, target ranges for A1C and blood sugars, signs/symptoms and treatment of hyper/hypoglycemia, monitoring blood sugars, taking medications as prescribed, benefits of exercising 30 minutes per day and prevention of complications of DM. ? ?Lifestyle Medicine ?- Whole Food, Plant Predominant Nutrition is highly recommended: Eat Plenty of vegetables, Mushrooms, fruits, Legumes, Whole Grains, Nuts, seeds in lieu of processed meats, processed snacks/pastries red meat, poultry, eggs.  ?  ?-It is better to avoid simple carbohydrates including: Cakes, Sweet Desserts, Ice Cream, Soda (diet and regular), Sweet Tea, Candies, Chips, Cookies, Store Bought Juices, Alcohol in Excess  of  1-2 drinks a day, Lemonade,  Artificial Sweeteners, Doughnuts, Coffee Creamers, "Sugar-free" Products, etc, etc.  This is not a complete list..... ? ?Exercise: If you are able: 30 -60 minutes a day ,4 days a week, or 150 minutes a week.  The longer the better.  Combine stretch, strength, and aerobic activities.  If you were told in the past that you have high risk for cardiovascular diseases, you may seek evaluation by your heart doctor prior to initiating moderate to intense exercise programs. ? ? ?Handouts Provided Include  ?Lifestyle Plant based plate method ?Lifestyle nutrition ?Meal Plan Card ?Calorie Density of foods ? ?Learning Style & Readiness for Change ?Teaching method utilized: Visual & Auditory  ?Demonstrated degree of understanding via: Teach Back  ?Barriers to learning/adherence to lifestyle change: none ? ?Goals Established by Pt ? ? ?Walk 30-60 minutes three times per week. ?Increase fiber rich foods and try oatmeal for breakfast ?Avoid skipping meals ?Avoid smoothies. ?Lose 2 lbs per month ?Get A1C down to 7% ?Take Metformin after meals and not on empty stomach. ? ?Lifestyle Medicine ?- Whole Food, Plant Predominant Nutrition is highly recommended: Eat Plenty of vegetables, Mushrooms, fruits, Legumes, Whole Grains, Nuts, seeds in lieu of processed meats, processed snacks/pastries red meat, poultry, eggs.  ?  ?-It is better to avoid simple carbohydrates including: Cakes, Sweet Desserts, Ice Cream, Soda (diet and regular), Sweet Tea, Candies, Chips, Cookies, Store Bought Juices, Alcohol in Excess of  1-2 drinks a day, Lemonade,  Artificial Sweeteners, Doughnuts, Coffee Creamers, "Sugar-free" Products, etc, etc.  This is not a complete list..... ? ?Exercise: If you are able: 30 -60 minutes a day ,4 days a week, or 150 minutes a week.  The longer the better.  Combine stretch, strength, and aerobic activities.  If you were told in the past that you have high risk for cardiovascular diseases, you may  seek evaluation by your heart doctor prior to initiating moderate to intense exercise programs. ? ? ? ?MONITORING & EVALUATION ?Dietary intake, weekly physical activity, and meal planning and BS in 1 month. ? ?Next Steps  ?Patient is to work on meal prepping and talking to MD about medication management of her DM. ?. ? ?

## 2021-04-10 LAB — CMP14+EGFR
ALT: 47 IU/L — ABNORMAL HIGH (ref 0–32)
AST: 33 IU/L (ref 0–40)
Albumin/Globulin Ratio: 1.3 (ref 1.2–2.2)
Albumin: 4.3 g/dL (ref 3.8–4.8)
Alkaline Phosphatase: 72 IU/L (ref 44–121)
BUN/Creatinine Ratio: 21 (ref 9–23)
BUN: 15 mg/dL (ref 6–20)
Bilirubin Total: 0.3 mg/dL (ref 0.0–1.2)
CO2: 22 mmol/L (ref 20–29)
Calcium: 9.1 mg/dL (ref 8.7–10.2)
Chloride: 100 mmol/L (ref 96–106)
Creatinine, Ser: 0.73 mg/dL (ref 0.57–1.00)
Globulin, Total: 3.3 g/dL (ref 1.5–4.5)
Glucose: 173 mg/dL — ABNORMAL HIGH (ref 70–99)
Potassium: 4.5 mmol/L (ref 3.5–5.2)
Sodium: 136 mmol/L (ref 134–144)
Total Protein: 7.6 g/dL (ref 6.0–8.5)
eGFR: 111 mL/min/{1.73_m2} (ref >59)

## 2021-04-10 LAB — LIPID PANEL
Chol/HDL Ratio: 4.6 ratio — ABNORMAL HIGH (ref 0.0–4.4)
Cholesterol, Total: 184 mg/dL (ref 100–199)
HDL: 40 mg/dL (ref >39)
LDL Chol Calc (NIH): 123 mg/dL — ABNORMAL HIGH (ref 0–99)
Triglycerides: 114 mg/dL (ref 0–149)
VLDL Cholesterol Cal: 21 mg/dL (ref 5–40)

## 2021-04-10 LAB — HEMOGLOBIN A1C
Est. average glucose Bld gHb Est-mCnc: 200 mg/dL
Hgb A1c MFr Bld: 8.6 % — ABNORMAL HIGH (ref 4.8–5.6)

## 2021-04-10 NOTE — Progress Notes (Signed)
Patient has an upcoming appointment, I will discuss labs with her during her visit.  LDL not at goal. Will start Lovaza 4g daily, will add cholestyramine 4g/day if needed and if pt is still lactating.  Will discuss adding a GLP1 to help manage her uncontrolled  T2DM

## 2021-04-19 ENCOUNTER — Encounter: Payer: Self-pay | Admitting: Nurse Practitioner

## 2021-04-19 ENCOUNTER — Ambulatory Visit (INDEPENDENT_AMBULATORY_CARE_PROVIDER_SITE_OTHER): Payer: 59 | Admitting: Nurse Practitioner

## 2021-04-19 ENCOUNTER — Other Ambulatory Visit: Payer: Self-pay

## 2021-04-19 VITALS — BP 114/71 | HR 59 | Ht 64.0 in | Wt 180.0 lb

## 2021-04-19 DIAGNOSIS — E785 Hyperlipidemia, unspecified: Secondary | ICD-10-CM | POA: Diagnosis not present

## 2021-04-19 DIAGNOSIS — E1165 Type 2 diabetes mellitus with hyperglycemia: Secondary | ICD-10-CM

## 2021-04-19 DIAGNOSIS — E1169 Type 2 diabetes mellitus with other specified complication: Secondary | ICD-10-CM | POA: Diagnosis not present

## 2021-04-19 MED ORDER — OZEMPIC (0.25 OR 0.5 MG/DOSE) 2 MG/1.5ML ~~LOC~~ SOPN: 0.2500 mg | PEN_INJECTOR | SUBCUTANEOUS | 0 refills | Status: DC

## 2021-04-19 MED ORDER — OZEMPIC (0.25 OR 0.5 MG/DOSE) 2 MG/1.5ML ~~LOC~~ SOPN: 0.5000 mg | PEN_INJECTOR | SUBCUTANEOUS | 0 refills | Status: DC

## 2021-04-19 NOTE — Assessment & Plan Note (Signed)
Patient currently lactating ?LDL goal is less than 70 ?Taking omega 3 1000 mg daily. ?Avoid fatty fried foods. ?Lab Results  ?Component Value Date  ? CHOL 184 04/09/2021  ? HDL 40 04/09/2021  ? LDLCALC 123 (H) 04/09/2021  ? TRIG 114 04/09/2021  ? CHOLHDL 4.6 (H) 04/09/2021  ? ?

## 2021-04-19 NOTE — Assessment & Plan Note (Addendum)
Lab Results  ?Component Value Date  ? HGBA1C 8.6 (H) 04/09/2021  ?Takes metformin 1000 mg twice daily. ?A1c much improved from previous readings ?Start Ozempic 0.25 mg once weekly injection, for heart and kidney Protection. prescriptions written today, follow-up in 4 weeks to titrate medication.  Side effects of medications reviewed with patient she verbalized understanding. ?Diabetic foot exam completed today. ?Diabetic eye exam ordered. ?Avoid sugar sweets cake, engage in vigorous exercise 30 minutes daily 5 to 7 days a week ?

## 2021-04-19 NOTE — Patient Instructions (Addendum)
Start ozempic 0.25 mg once a week injection, use one weekly for 4 weeks. After 4 weeks start ozempic 0.5mg  injection once weekly for your diabetes. ? ? Please call to schedule a 4 weeks follow up visit as soon as you pick up your medication.  ? ?Please schedule your eye exam today  ? ?It is important that you exercise regularly at least 30 minutes 5 times a week.  ?Think about what you will eat, plan ahead. ?Choose " clean, green, fresh or frozen" over canned, processed or packaged foods which are more sugary, salty and fatty. ?70 to 75% of food eaten should be vegetables and fruit. ?Three meals at set times with snacks allowed between meals, but they must be fruit or vegetables. ?Aim to eat over a 12 hour period , example 7 am to 7 pm, and STOP after  your last meal of the day. ?Drink water,generally about 64 ounces per day, no other drink is as healthy. Fruit juice is best enjoyed in a healthy way, by EATING the fruit. ? ?Thanks for choosing [facility], we consider it a privelige to serve you.  ? ? ? ?

## 2021-04-19 NOTE — Progress Notes (Signed)
? ?  Jessica Carey     MRN: [id number]      DOB: [birth year] ? ? ?HPI ?Jessica Carey with medical history of type 2 diabetes, hyperlipidemia, atrial fibrillation is here for follow up for her chronic medical conditions. ?Patient denies any concerns today, denies adverse reaction to current medications.  ? ?Patient states that her child is still lactating so she has been taking fish oil for her hyperlipidemia ? ? ?ROS ?Denies recent fever or chills. ?Denies sinus pressure, nasal congestion, ear pain or sore throat. ?Denies chest congestion, productive cough or wheezing. ?Denies chest pains, palpitations and leg swelling ?Denies abdominal pain, nausea, vomiting,diarrhea or constipation.   ?Denies dysuria, frequency, hesitancy or incontinence. ?Denies joint pain, swelling and limitation in mobility. ?Denies headaches, seizures, numbness, or tingling. ?Denies depression, anxiety or insomnia. ?Denies skin break down or rash. ? ? ?PE ? ?BP 114/71 (BP Location: Right Arm, Patient Position: Sitting, Cuff Size: Large)   Pulse (!) 59   Ht 5\' 4"  (1.626 m)   Wt 180 lb (81.6 kg)   LMP 04/15/2021 (Exact Date)   SpO2 99%   BMI 30.90 kg/m?  ? ?Patient alert and oriented and in no cardiopulmonary distress. ? ? ?Chest: Clear to auscultation bilaterally. ? ?CVS: S1, S2 no murmurs, no S3.Regular rate. ? ?ABD: Soft non tender.  ? ?Ext: No edema ? ?MS: Adequate ROM spine, shoulders, hips and knees. ? ?Skin: Intact, no ulcerations or rash noted. ? ?Psych: Good eye contact, normal affect. Memory intact not anxious or depressed appearing. ? ? ? ?Assessment & Plan ? ?Uncontrolled diabetes mellitus with hyperglycemia, without long-term current use of insulin (HCC) ?Lab Results  ?Component Value Date  ? HGBA1C 8.6 (H) 04/09/2021  ?Takes metformin 1000 mg twice daily. ?A1c much improved from previous readings ?Start Ozempic 0.25 mg once weekly injection, for heart and kidney Protection. prescriptions written today, follow-up in 4 weeks to  titrate medication.  Side effects of medications reviewed with patient she verbalized understanding. ?Diabetic foot exam completed today. ?Diabetic eye exam ordered. ?Avoid sugar sweets cake, engage in vigorous exercise 30 minutes daily 5 to 7 days a week ? ?Hyperlipidemia associated with type 2 diabetes mellitus (HCC) ?Patient currently lactating ?LDL goal is less than 70 ?Taking omega 3 1000 mg daily. ?Avoid fatty fried foods. ?Lab Results  ?Component Value Date  ? CHOL 184 04/09/2021  ? HDL 40 04/09/2021  ? LDLCALC 123 (H) 04/09/2021  ? TRIG 114 04/09/2021  ? CHOLHDL 4.6 (H) 04/09/2021  ?  ?

## 2021-04-22 ENCOUNTER — Other Ambulatory Visit: Payer: Self-pay

## 2021-04-22 MED ORDER — BD PEN NEEDLE NANO U/F 32G X 4 MM MISC: 1 refills | Status: DC

## 2021-04-30 ENCOUNTER — Other Ambulatory Visit: Payer: Self-pay

## 2021-04-30 ENCOUNTER — Ambulatory Visit: Payer: 59

## 2021-05-07 ENCOUNTER — Other Ambulatory Visit: Payer: Self-pay | Admitting: Nurse Practitioner

## 2021-05-10 ENCOUNTER — Telehealth: Payer: Self-pay | Admitting: *Deleted

## 2021-05-10 NOTE — Telephone Encounter (Signed)
? ?  Pre-operative Risk Assessment  ?  ?Patient Name: Jessica Carey  ?DOB: 1985-03-30 ?MRN: [id number]  ? ? ? ?Request for Surgical Clearance   ? ?Procedure:  Dental Extraction - Amount of Teeth to be Pulled:  4 WISDOM TEETH  ? ?Date of Surgery:  Clearance 05/14/21                              ?   ?Surgeon:  DR. Michael Litter, D.M.D ?Surgeon's Group or Practice Name:  [facility] ?Phone number:  [phone] ?Fax number:  [phone] ?  ?Type of Clearance Requested:   ?- Medical  ?  ?Type of Anesthesia:   CONSCIOUS SEDATION ?  ?Additional requests/questions:   ? ?Signed, ?Julaine Hua   ?05/10/2021, 9:30 AM  ? ?

## 2021-05-10 NOTE — Telephone Encounter (Signed)
? ?  Patient Name: Jessica Carey  ?DOB: 21-Feb-1985 ?MRN: [id number] ? ?Primary Cardiologist: Carlyle Dolly, MD ? ?Chart reviewed as part of pre-operative protocol coverage. The patient has an upcoming visit scheduled with Dr. Lovena Le on 05/13/2021 at which time clearance can be addressed in case there are any issues that would impact surgical recommendations.  Patient was notified via phone of the need to keep her appointment with Dr. Lovena Le to proceed with clearance. ? ?Wisdom tooth extraction is scheduled for 05/14/2021 as below. I added preop FYI to appointment note so that provider is aware to address at time of outpatient visit.  Per office protocol the cardiology provider should forward their finalize clearance decision and recommendations regarding antiplatelet therapy to requesting party below. ? ?I will route this message as FYI to requesting party and remove this message from the preop box as separate preop APP input not needed at this time.  Please call with any questions. ? ? ?Lenna Sciara, NP ?05/10/2021, 9:58 AM ? ? ?

## 2021-05-13 ENCOUNTER — Ambulatory Visit (INDEPENDENT_AMBULATORY_CARE_PROVIDER_SITE_OTHER): Payer: 59 | Admitting: Internal Medicine

## 2021-05-13 ENCOUNTER — Encounter: Payer: Self-pay | Admitting: Internal Medicine

## 2021-05-13 VITALS — BP 112/72 | HR 96 | Ht 64.0 in | Wt 172.0 lb

## 2021-05-13 DIAGNOSIS — R002 Palpitations: Secondary | ICD-10-CM | POA: Diagnosis not present

## 2021-05-13 NOTE — Patient Instructions (Signed)
Medication Instructions:  °Your physician recommends that you continue on your current medications as directed. Please refer to the Current Medication list given to you today. ° °Labwork: °None ordered. ° °Testing/Procedures: °None ordered. ° °Follow-Up: °Your physician wants you to follow-up in: one year with Gregg Taylor, MD  °You will receive a reminder letter in the mail two months in advance. If you don't receive a letter, please call our office to schedule the follow-up appointment. ° °Any Other Special Instructions Will Be Listed Below (If Applicable). ° °If you need a refill on your cardiac medications before your next appointment, please call your pharmacy.  ° ° ° ° °

## 2021-05-13 NOTE — Progress Notes (Signed)
? ? ? ? ?HPI ?Jessica Carey returns today for followup. She is a pleasant 36 yo woman with SVT who underwent catheter ablation a couple of years ago. She had recurrent palpitations which appeared to be due to brief paroxysms of atrial tachcardia. She had an improvement in these symptoms but when she is tired they will recur. She denies chest pain or sob. She has had some musculoskeletal pain. She is asymptomatic from her PAC's. ?No Known Allergies ? ? ?Current Outpatient Medications  ?Medication Sig Dispense Refill  ? blood glucose meter kit and supplies Dispense based on patient and insurance preference. Use up to four times daily as directed. (FOR ICD-10 E10.9, E11.9). 1 each 0  ? Insulin Pen Needle (BD PEN NEEDLE NANO U/F) 32G X 4 MM MISC Use with Ozempic 100 each 1  ? Lancets (ONETOUCH DELICA PLUS [id number]) MISC USE AS DIRECTED FOUR TIMES A DAY 100 each 0  ? metFORMIN (GLUCOPHAGE) 1000 MG tablet Take 1 tablet (1,000 mg total) by mouth 2 (two) times daily with a meal. 60 tablet 6  ? Omega 3 1000 MG CAPS Take 1 daily 60 capsule   ? ONETOUCH VERIO test strip USE AS DIRECTED TO TEST BLOOD SUGARS FOUR TIMES A DAY 100 strip 0  ? Semaglutide,0.25 or 0.5MG/DOS, (OZEMPIC, 0.25 OR 0.5 MG/DOSE,) 2 MG/1.5ML SOPN Inject 0.25 mg into the skin once a week. 3 mL 0  ? [START ON 05/17/2021] Semaglutide,0.25 or 0.5MG/DOS, (OZEMPIC, 0.25 OR 0.5 MG/DOSE,) 2 MG/1.5ML SOPN Inject 0.5 mg into the skin once a week. 3 mL 0  ? ?No current facility-administered medications for this visit.  ? ? ? ?Past Medical History:  ?Diagnosis Date  ? ASCUS of cervix with negative high risk HPV 12/24/2020  ? 12/24/20 repeat pap in 3 years per [facility],5 year risk CIN3+ is 0.40%  ? Boil, breast 07/04/2013  ? Diabetes ([place]) 08/04/2013  ? Diabetes mellitus without complication ([place])   ? glyburide and insulin  ? Dysrhythmia   ? a-fib  ? Hyperlipidemia   ? Irregular periods   ? ? ?ROS: ? ? All systems reviewed and negative except as noted in the HPI. ? ? ?Past  Surgical History:  ?Procedure Laterality Date  ? BREAST SURGERY Right   ? breast abcess  ? ELECTROPHYSIOLOGY STUDY N/A 03/22/2018  ? Procedure: ELECTROPHYSIOLOGY STUDY;  Surgeon: Evans Lance, MD;  Location: [facility];  Service: Cardiovascular;  Laterality: N/A;  ? PERINEAL LACERATION REPAIR N/A 03/17/2018  ? Procedure: SUTURE REPAIR PERINEAL LACERATION;  Surgeon: Osborne Oman, MD;  Location: [facility];  Service: Gynecology;  Laterality: N/A;  ? SURGERY OF LIP    ? SVT ABLATION N/A 03/22/2018  ? Procedure: SVT ABLATION;  Surgeon: Evans Lance, MD;  Location: [facility];  Service: Cardiovascular;  Laterality: N/A;  ? ? ? ?Family History  ?Problem Relation Age of Onset  ? Diabetes Paternal Grandmother   ? Diabetes Father   ? Hyperlipidemia Father   ? Diabetes Mother   ? Diabetes Sister   ? Cancer Maternal Aunt   ?     liver  ? ? ? ?Social History  ? ?Socioeconomic History  ? Marital status: Single  ?  Spouse name: Not on file  ? Number of children: 1  ? Years of education: Not on file  ? Highest education level: Not on file  ?Occupational History  ? Not on file  ?Tobacco Use  ? Smoking status: Never  ?  Smokeless tobacco: Never  ?Vaping Use  ? Vaping Use: Never used  ?Substance and Sexual Activity  ? Alcohol use: Not Currently  ?  Comment: occ.  ? Drug use: No  ? Sexual activity: Yes  ?  Partners: Male  ?  Birth control/protection: Other-see comments  ?  Comment: Vasectomy  ?Other Topics Concern  ? Not on file  ?Social History Narrative  ? ** Merged History Encounter **  ?    ? ?Social Determinants of Health  ? ?Financial Resource Strain: Not on file  ?Food Insecurity: Not on file  ?Transportation Needs: Not on file  ?Physical Activity: Not on file  ?Stress: Not on file  ?Social Connections: Not on file  ?Intimate Partner Violence: Not on file  ? ? ? ?BP 112/72   Pulse 96   Ht '5\' 4"'  (1.626 m)   Wt 172 lb (78 kg)   LMP 04/15/2021 (Exact Date)   BMI 29.52 kg/m?  ? ?Physical  Exam: ? ?Well appearing NAD ?HEENT: Unremarkable ?Neck:  No JVD, no thyromegally ?Lymphatics:  No adenopathy ?Back:  No CVA tenderness ?Lungs:  Clear with no wheezes ?HEART:  Regular rate rhythm, no murmurs, no rubs, no clicks ?Abd:  soft, positive bowel sounds, no organomegally, no rebound, no guarding ?Ext:  2 plus pulses, no edema, no cyanosis, no clubbing ?Skin:  No rashes no nodules ?Neuro:  CN II through XII intact, motor grossly intact ? ?EKG -  NSR with consecutive PAC's ? ? ? ?Assess/Plan:  ?1. Palpitations - these are infrequent. She did not feel her palpitations today. ?2. Obesity - she has been placed on ozempic and lost 8 lbs in 3 weeks.  ?3. SVT - she is s/p ablation of AVNRT. She will call us if she has recurrent symptoms.  ?4. Preop eval - she is pending dental extractions. She is an acceptable surgical risk. She may proceed with dental extractions and may be placed under anesthesia if needed. ? ?Jessica Carey ?  ?

## 2021-05-27 ENCOUNTER — Other Ambulatory Visit: Payer: Self-pay | Admitting: Nurse Practitioner

## 2021-06-05 ENCOUNTER — Encounter: Payer: Self-pay | Admitting: Nurse Practitioner

## 2021-06-05 ENCOUNTER — Ambulatory Visit (INDEPENDENT_AMBULATORY_CARE_PROVIDER_SITE_OTHER): Payer: 59 | Admitting: Nurse Practitioner

## 2021-06-05 VITALS — BP 112/79 | HR 94 | Ht 64.0 in | Wt 180.0 lb

## 2021-06-05 DIAGNOSIS — Z139 Encounter for screening, unspecified: Secondary | ICD-10-CM | POA: Diagnosis not present

## 2021-06-05 DIAGNOSIS — E1165 Type 2 diabetes mellitus with hyperglycemia: Secondary | ICD-10-CM | POA: Diagnosis not present

## 2021-06-05 DIAGNOSIS — E785 Hyperlipidemia, unspecified: Secondary | ICD-10-CM

## 2021-06-05 DIAGNOSIS — E1169 Type 2 diabetes mellitus with other specified complication: Secondary | ICD-10-CM | POA: Diagnosis not present

## 2021-06-05 DIAGNOSIS — E663 Overweight: Secondary | ICD-10-CM

## 2021-06-05 MED ORDER — SEMAGLUTIDE (1 MG/DOSE) 4 MG/3ML ~~LOC~~ SOPN: 1.0000 mg | PEN_INJECTOR | SUBCUTANEOUS | 0 refills | Status: DC

## 2021-06-05 MED ORDER — SEMAGLUTIDE (2 MG/DOSE) 8 MG/3ML ~~LOC~~ SOPN: 2.0000 mg | PEN_INJECTOR | SUBCUTANEOUS | 0 refills | Status: DC

## 2021-06-05 NOTE — Assessment & Plan Note (Signed)
Doing well on Ozempic 0.25 once weekly injection ?And metformin 1000 mg twice daily ?Start Ozempic 0.5 mg injection once weekly for 28 days, afterwards take Ozempic 1 mg once weekly for 28 days, afterwards take Ozempic 2 mg once weekly for 28 days.  Continue metformin 1000 mg twice daily ?Avoid sugar sweets soda ?Follow-up in 3 months ?Urine creatinine ratio, A1c ordered ?Continue to check CBG at home.Goal for fasting blood sugar ranges from 80 to 120 and 2 hours after any meal or at bedtime should be between 130 to 170. ?Still not on statin due to her current lactation status ?

## 2021-06-05 NOTE — Progress Notes (Signed)
? ?  Jessica Carey     MRN: [id number]      DOB: 1985/06/22 ? ? ?HPI ?Jessica Carey with past medical history of type 2 diabetes, hyperlipidemia, obesity is here for follow up for diabetes. ? ?She has been taking Ozempic 0.25 mg once weekly injection since her last visit, patient states that she is tolerating medication well, initially had nausea but that has resolved, she has been checking her blood sugar daily at home, denies hypoglycemia.  Stated that her recent AM CBG was 106. ?. ?States that she has had her eye exam done and is awaiting the results. ? ? ? ? ? ?ROS ? ?Denies recent fever or chills. ?Denies sinus pressure, nasal congestion, ear pain or sore throat. ?Denies chest congestion, productive cough or wheezing. ?Denies chest pains, palpitations and leg swelling ?Denies abdominal pain, nausea, vomiting,diarrhea or constipation.   ?Denies dysuria, frequency, hesitancy or incontinence. ?Denies joint pain, swelling and limitation in mobility. ?Denies headaches, seizures, numbness, or tingling. ?Denies depression, anxiety or insomnia. ?. ? ? ?PE ? ?BP 112/79 (BP Location: Right Arm, Patient Position: Sitting, Cuff Size: Large)   Pulse 94   Ht 5\' 4"  (1.626 m)   Wt 180 lb (81.6 kg)   LMP 05/15/2021 (Approximate)   SpO2 99%   BMI 30.90 kg/m?  ? ?Patient alert and oriented and in no cardiopulmonary distress. ? ? ?Chest: Clear to auscultation bilaterally. ? ?CVS: S1, S2 no murmurs, no S3.Regular rate. ? ?ABD: Soft non tender.  ? ?Ext: No edema ? ?MS: Adequate ROM spine, shoulders, hips and knees. ? ?Skin: Intact, no ulcerations or rash noted. ? ?Psych: Good eye contact, normal affect. Memory intact not anxious or depressed appearing. ? ? ? ?Assessment & Plan ? ?Overweight (BMI 25.0-29.9) ?Wt Readings from Last 3 Encounters:  ?06/05/21 180 lb (81.6 kg)  ?05/13/21 172 lb (78 kg)  ?04/19/21 180 lb (81.6 kg)  ?No change in her weight since last visit to this office, states that she has not been exercising ?Need to do  vigorous  exercise daily at least 30 minutes, increase intake of whole food consisting mainly of vegetables and protein less carbohydrate drinking water instead of juice, importance of portion control also discussed with patient she verbalized understanding. ? ?Uncontrolled diabetes mellitus with hyperglycemia, without long-term current use of insulin (HCC) ?Doing well on Ozempic 0.25 once weekly injection ?And metformin 1000 mg twice daily ?Start Ozempic 0.5 mg injection once weekly for 28 days, afterwards take Ozempic 1 mg once weekly for 28 days, afterwards take Ozempic 2 mg once weekly for 28 days.  Continue metformin 1000 mg twice daily ?Avoid sugar sweets soda ?Follow-up in 3 months ?Urine creatinine ratio, A1c ordered ?Continue to check CBG at home.Goal for fasting blood sugar ranges from 80 to 120 and 2 hours after any meal or at bedtime should be between 130 to 170. ?Still not on statin due to her current lactation status ? ?Hyperlipidemia associated with type 2 diabetes mellitus (HCC) ?Still lactating ?Avoid fried foods, fatty foods ?Lab Results  ?Component Value Date  ? CHOL 184 04/09/2021  ? HDL 40 04/09/2021  ? LDLCALC 123 (H) 04/09/2021  ? TRIG 114 04/09/2021  ? CHOLHDL 4.6 (H) 04/09/2021  ?Check lipid panel at next visit  ?

## 2021-06-05 NOTE — Assessment & Plan Note (Addendum)
Wt Readings from Last 3 Encounters:  ?06/05/21 180 lb (81.6 kg)  ?05/13/21 172 lb (78 kg)  ?04/19/21 180 lb (81.6 kg)  ?No change in her weight since last visit to this office, states that she has not been exercising ?Need to do vigorous  exercise daily at least 30 minutes, increase intake of whole food consisting mainly of vegetables and protein less carbohydrate drinking water instead of juice, importance of portion control also discussed with patient she verbalized understanding. ?

## 2021-06-05 NOTE — Patient Instructions (Addendum)
?  Schedule for ozempic once weekly injection ? ?Inject 0.5mg  once a week for 28 days  ? ?Inject 1 mg as directed once a week for 28 days. ? ?Inject 2 mg as directed once a week for 28 days. ? ? ? ?Goal for fasting blood sugar ranges from 80 to 120 and 2 hours after any meal or at bedtime should be between 130 to 170.  ? ? ?It is important that you exercise regularly at least 30 minutes 5 times a week.  ?Think about what you will eat, plan ahead. ?Choose " clean, green, fresh or frozen" over canned, processed or packaged foods which are more sugary, salty and fatty. ?70 to 75% of food eaten should be vegetables and fruit. ?Three meals at set times with snacks allowed between meals, but they must be fruit or vegetables. ?Aim to eat over a 12 hour period , example 7 am to 7 pm, and STOP after  your last meal of the day. ?Drink water,generally about 64 ounces per day, no other drink is as healthy. Fruit juice is best enjoyed in a healthy way, by EATING the fruit. ? ?Thanks for choosing [facility], we consider it a privelige to serve you.  ?

## 2021-06-05 NOTE — Assessment & Plan Note (Signed)
Still lactating ?Avoid fried foods, fatty foods ?Lab Results  ?Component Value Date  ? CHOL 184 04/09/2021  ? HDL 40 04/09/2021  ? LDLCALC 123 (H) 04/09/2021  ? TRIG 114 04/09/2021  ? CHOLHDL 4.6 (H) 04/09/2021  ?Check lipid panel at next visit ?

## 2021-06-07 ENCOUNTER — Other Ambulatory Visit: Payer: Self-pay | Admitting: Nurse Practitioner

## 2021-07-02 ENCOUNTER — Other Ambulatory Visit: Payer: Self-pay | Admitting: Nurse Practitioner

## 2021-07-08 ENCOUNTER — Other Ambulatory Visit: Payer: Self-pay | Admitting: Nurse Practitioner

## 2021-07-09 ENCOUNTER — Telehealth: Payer: Self-pay

## 2021-07-09 NOTE — Telephone Encounter (Signed)
Spoke with pharmacy they received refill request they just have to order medicine spoke with pt advised of this

## 2021-07-09 NOTE — Telephone Encounter (Signed)
Patient called need refill on OZEMPIC, 1 MG/DOSE, 4 MG/3ML [facility]  Pharmacy said has not received it yet  Pharmacy: [facility]

## 2021-07-10 ENCOUNTER — Ambulatory Visit: Payer: 59 | Admitting: Nutrition

## 2021-07-30 ENCOUNTER — Other Ambulatory Visit: Payer: Self-pay | Admitting: Nurse Practitioner

## 2021-08-16 ENCOUNTER — Other Ambulatory Visit: Payer: Self-pay | Admitting: Nurse Practitioner

## 2021-08-16 ENCOUNTER — Telehealth: Payer: Self-pay | Admitting: Nurse Practitioner

## 2021-08-16 MED ORDER — SEMAGLUTIDE (2 MG/DOSE) 8 MG/3ML ~~LOC~~ SOPN: 2.0000 mg | PEN_INJECTOR | SUBCUTANEOUS | 2 refills | Status: AC

## 2021-08-16 NOTE — Telephone Encounter (Signed)
Pt called stating she is needing a refill on OZEMPIC, 1 MG/DOSE, 4 MG/3ML SOPN. She needs it for this week. Can you please refill?     [facility]

## 2021-08-16 NOTE — Telephone Encounter (Signed)
Please advise 

## 2021-08-16 NOTE — Progress Notes (Unsigned)
o

## 2021-08-19 ENCOUNTER — Telehealth: Payer: Self-pay | Admitting: Nurse Practitioner

## 2021-08-19 NOTE — Telephone Encounter (Signed)
Pt called stating that phar texted her today and told her the medication that was called in on Friday 7/14 has been denied by her dr. She is wanting to know why this was denied?    Semaglutide, 2 MG/DOSE, 8 MG/3ML SOPN     [facility] [place]

## 2021-08-22 NOTE — Telephone Encounter (Signed)
Spoke with pt advised that fola sent in new dose and to check pharmacy pt verbalized understanding

## 2021-08-29 ENCOUNTER — Other Ambulatory Visit: Payer: Self-pay | Admitting: Nurse Practitioner

## 2021-09-04 NOTE — Progress Notes (Signed)
Will review results with patient at her upcoming appointment

## 2021-09-05 ENCOUNTER — Encounter: Payer: Self-pay | Admitting: Nurse Practitioner

## 2021-09-05 ENCOUNTER — Ambulatory Visit (INDEPENDENT_AMBULATORY_CARE_PROVIDER_SITE_OTHER): Payer: 59 | Admitting: Nurse Practitioner

## 2021-09-05 VITALS — BP 104/65 | HR 98 | Ht 62.0 in | Wt 183.0 lb

## 2021-09-05 DIAGNOSIS — Z0000 Encounter for general adult medical examination without abnormal findings: Secondary | ICD-10-CM | POA: Insufficient documentation

## 2021-09-05 DIAGNOSIS — E1165 Type 2 diabetes mellitus with hyperglycemia: Secondary | ICD-10-CM

## 2021-09-05 DIAGNOSIS — Z0001 Encounter for general adult medical examination with abnormal findings: Secondary | ICD-10-CM | POA: Diagnosis not present

## 2021-09-05 DIAGNOSIS — E1169 Type 2 diabetes mellitus with other specified complication: Secondary | ICD-10-CM

## 2021-09-05 DIAGNOSIS — E559 Vitamin D deficiency, unspecified: Secondary | ICD-10-CM | POA: Diagnosis not present

## 2021-09-05 DIAGNOSIS — R197 Diarrhea, unspecified: Secondary | ICD-10-CM | POA: Insufficient documentation

## 2021-09-05 DIAGNOSIS — E785 Hyperlipidemia, unspecified: Secondary | ICD-10-CM

## 2021-09-05 LAB — TSH+FREE T4
Free T4: 0.97 ng/dL (ref 0.82–1.77)
TSH: 0.981 u[IU]/mL (ref 0.450–4.500)

## 2021-09-05 LAB — CMP14+EGFR
ALT: 34 IU/L — ABNORMAL HIGH (ref 0–32)
AST: 25 IU/L (ref 0–40)
Albumin/Globulin Ratio: 1.2 (ref 1.2–2.2)
Albumin: 4.2 g/dL (ref 3.9–4.9)
Alkaline Phosphatase: 66 IU/L (ref 44–121)
BUN/Creatinine Ratio: 18 (ref 9–23)
BUN: 14 mg/dL (ref 6–20)
Bilirubin Total: 0.6 mg/dL (ref 0.0–1.2)
CO2: 24 mmol/L (ref 20–29)
Calcium: 9.2 mg/dL (ref 8.7–10.2)
Chloride: 99 mmol/L (ref 96–106)
Creatinine, Ser: 0.78 mg/dL (ref 0.57–1.00)
Globulin, Total: 3.5 g/dL (ref 1.5–4.5)
Glucose: 113 mg/dL — ABNORMAL HIGH (ref 70–99)
Potassium: 4.3 mmol/L (ref 3.5–5.2)
Sodium: 136 mmol/L (ref 134–144)
Total Protein: 7.7 g/dL (ref 6.0–8.5)
eGFR: 102 mL/min/{1.73_m2} (ref >59)

## 2021-09-05 LAB — CBC WITH DIFFERENTIAL/PLATELET
Basophils Absolute: 0.1 10*3/uL (ref 0.0–0.2)
Basos: 0 %
EOS (ABSOLUTE): 0.1 10*3/uL (ref 0.0–0.4)
Eos: 1 %
Hematocrit: 40 % (ref 34.0–46.6)
Hemoglobin: 13.3 g/dL (ref 11.1–15.9)
Immature Grans (Abs): 0 10*3/uL (ref 0.0–0.1)
Immature Granulocytes: 0 %
Lymphocytes Absolute: 4.4 10*3/uL — ABNORMAL HIGH (ref 0.7–3.1)
Lymphs: 37 %
MCH: 28.9 pg (ref 26.6–33.0)
MCHC: 33.3 g/dL (ref 31.5–35.7)
MCV: 87 fL (ref 79–97)
Monocytes Absolute: 0.6 10*3/uL (ref 0.1–0.9)
Monocytes: 5 %
Neutrophils Absolute: 6.9 10*3/uL (ref 1.4–7.0)
Neutrophils: 57 %
Platelets: 230 10*3/uL (ref 150–450)
RBC: 4.6 x10E6/uL (ref 3.77–5.28)
RDW: 12.7 % (ref 11.7–15.4)
WBC: 12.1 10*3/uL — ABNORMAL HIGH (ref 3.4–10.8)

## 2021-09-05 LAB — MICROALBUMIN / CREATININE URINE RATIO
Creatinine, Urine: 202.4 mg/dL
Microalb/Creat Ratio: 7 mg/g creat (ref 0–29)
Microalbumin, Urine: 15.1 ug/mL

## 2021-09-05 LAB — LIPID PANEL
Chol/HDL Ratio: 4.9 ratio — ABNORMAL HIGH (ref 0.0–4.4)
Cholesterol, Total: 171 mg/dL (ref 100–199)
HDL: 35 mg/dL — ABNORMAL LOW (ref >39)
LDL Chol Calc (NIH): 118 mg/dL — ABNORMAL HIGH (ref 0–99)
Triglycerides: 98 mg/dL (ref 0–149)
VLDL Cholesterol Cal: 18 mg/dL (ref 5–40)

## 2021-09-05 LAB — VITAMIN D 25 HYDROXY (VIT D DEFICIENCY, FRACTURES): Vit D, 25-Hydroxy: 27.5 ng/mL — ABNORMAL LOW (ref 30.0–100.0)

## 2021-09-05 LAB — HEMOGLOBIN A1C
Est. average glucose Bld gHb Est-mCnc: 154 mg/dL
Hgb A1c MFr Bld: 7 % — ABNORMAL HIGH (ref 4.8–5.6)

## 2021-09-05 MED ORDER — VITAMIN D3 25 MCG (1000 UT) PO CAPS: 1000.0000 [IU] | ORAL_CAPSULE | Freq: Every day | ORAL | 3 refills | Status: DC

## 2021-09-05 MED ORDER — LOPERAMIDE HCL 2 MG PO TABS: 2.0000 mg | ORAL_TABLET | Freq: Four times a day (QID) | ORAL | 0 refills | Status: DC | PRN

## 2021-09-05 NOTE — Assessment & Plan Note (Signed)
Lab Results  Component Value Date   HGBA1C 7.0 (H) 09/03/2021  Chronic condition much improved from previous A1c of 8.6 Wegovy still on back order, continue current dose of metformin 1000mg  BID avoid sugar sweets soda She is still lactating so cannot take a statin at this time Follow-up in 3 months

## 2021-09-05 NOTE — Assessment & Plan Note (Addendum)
This could be contributing to her fatigue ,start vitamin D 1000 units daily Last vitamin D Lab Results  Component Value Date   VD25OH 27.5 (L) 09/03/2021

## 2021-09-05 NOTE — Assessment & Plan Note (Signed)
Intermittent diarrhea Denies abdominal pain, nausea, vomiting bloody stool Take Imodium 2 mg 4 times daily as needed Patient encouraged to drink at least 64 ounces of water daily to avoid dehydration

## 2021-09-05 NOTE — Progress Notes (Signed)
Complete physical exam  Patient: Jessica Carey   DOB: 1985-03-05   36 y.o. Female  MRN: [id number]  Subjective:    Chief Complaint  Patient presents with   Annual Exam    cpe    Jessica Carey is a 36 y.o. female with past medical history of uncontrolled type 2 diabetes, hyperlipidemia, SVT who presents today for a complete physical exam. She generally feels fairly well.  States that she has not been engaging in regular exercises due to her heart condition. She feels fatigued but denies SOB, syncope, CP   She has been out of ozempic last dose was taken about a month ago .  Currently on metformin 1000 mg twice daily.  Has had her eye exam done at the office here but she has not recived the results yet   Has intermittent diarrhea, has not changed her diet  recently.  Patient denies abdominal pain, bloody stool, nausea, vomiting.  She has not taken any medication for her diarrhea.    Most recent fall risk assessment:    09/05/2021    9:08 AM  Fall Risk   Falls in the past year? 0  Number falls in past yr: 0  Injury with Fall? 0  Risk for fall due to : No Fall Risks  Follow up Falls evaluation completed     Most recent depression screenings:    09/05/2021    9:08 AM 06/05/2021    9:41 AM  PHQ 2/9 Scores  PHQ - 2 Score 0 0        Patient Care Team: Renee Rival, FNP as PCP - General (Nurse Practitioner) Arnoldo Lenis, MD as PCP - Cardiology (Cardiology) System, Provider Not In Jonnie Kind, MD (Inactive) as Consulting Physician (Obstetrics and Gynecology)   Outpatient Medications Prior to Visit  Medication Sig   blood glucose meter kit and supplies Dispense based on patient and insurance preference. Use up to four times daily as directed. (FOR ICD-10 E10.9, E11.9).   Insulin Pen Needle (BD PEN NEEDLE NANO U/F) 32G X 4 MM MISC Use with Ozempic   Lancets (ONETOUCH DELICA PLUS [id number]) MISC USE AS DIRECTED FOUR TIMES A DAY   metFORMIN (GLUCOPHAGE) 1000 MG  tablet Take 1 tablet (1,000 mg total) by mouth 2 (two) times daily with a meal.   Omega 3 1000 MG CAPS Take 1 daily   ONETOUCH VERIO test strip USE AS DIRECTED TO TEST BLOOD SUGARS FOUR TIMES A DAY   Semaglutide, 2 MG/DOSE, 8 MG/3ML SOPN Inject 2 mg as directed once a week for 28 days.   OZEMPIC, 1 MG/DOSE, 4 MG/3ML SOPN INJECT 1 MG AS DIRECTED ONCE A WEEK FOR 28 DAYS. (Patient not taking: Reported on 09/05/2021)   Semaglutide,0.25 or 0.5MG/DOS, (OZEMPIC, 0.25 OR 0.5 MG/DOSE,) 2 MG/1.5ML SOPN Inject 0.5 mg into the skin once a week. (Patient not taking: Reported on 09/05/2021)   No facility-administered medications prior to visit.    Review of Systems  Constitutional:  Positive for malaise/fatigue. Negative for chills, diaphoresis, fever and weight loss.  HENT:  Negative for ear discharge, ear pain, hearing loss and tinnitus.   Eyes: Negative.  Negative for blurred vision, double vision, pain, discharge and redness.  Respiratory: Negative.  Negative for cough, hemoptysis and sputum production.   Cardiovascular: Negative.  Negative for chest pain, palpitations and orthopnea.  Gastrointestinal:  Positive for diarrhea. Negative for abdominal pain, blood in stool, constipation, heartburn, melena, nausea and vomiting.  Genitourinary: Negative.  Negative for dysuria, hematuria and urgency.  Musculoskeletal: Negative.  Negative for joint pain, myalgias and neck pain.  Skin:  Negative for itching and rash.  Neurological: Negative.  Negative for dizziness, tingling and headaches.  Endo/Heme/Allergies: Negative.  Negative for environmental allergies and polydipsia. Does not bruise/bleed easily.  Psychiatric/Behavioral: Negative.  Negative for depression, hallucinations, substance abuse and suicidal ideas.           Objective:     BP 104/65 (BP Location: Right Arm, Patient Position: Sitting, Cuff Size: Normal)   Pulse 98   Ht _0  (1.575 m)   Wt 183 lb (83 kg)   LMP 08/01/2021 (Approximate)    SpO2 100%   BMI 33.47 kg/m   Patient declined breast exam today Physical Exam Constitutional:      General: She is not in acute distress.    Appearance: She is obese. She is not ill-appearing, toxic-appearing or diaphoretic.  HENT:     Head: Normocephalic and atraumatic.     Right Ear: Tympanic membrane, ear canal and external ear normal. There is no impacted cerumen.     Left Ear: Tympanic membrane, ear canal and external ear normal. There is no impacted cerumen.     Nose: Nose normal. No congestion or rhinorrhea.     Mouth/Throat:     Mouth: Mucous membranes are moist.     Pharynx: Oropharynx is clear. No oropharyngeal exudate or posterior oropharyngeal erythema.  Eyes:     General: No scleral icterus.       Right eye: No discharge.        Left eye: No discharge.     Extraocular Movements: Extraocular movements intact.     Conjunctiva/sclera: Conjunctivae normal.     Pupils: Pupils are equal, round, and reactive to light.  Neck:     Vascular: No carotid bruit.  Cardiovascular:     Rate and Rhythm: Normal rate. Rhythm irregular.     Pulses: Normal pulses.     Heart sounds: Normal heart sounds. No murmur heard.    No friction rub. No gallop.  Pulmonary:     Effort: Pulmonary effort is normal. No respiratory distress.     Breath sounds: Normal breath sounds. No stridor. No wheezing, rhonchi or rales.  Chest:     Chest wall: No tenderness.  Abdominal:     General: There is no distension.     Palpations: Abdomen is soft. There is no mass.     Tenderness: There is no abdominal tenderness. There is no right CVA tenderness, left CVA tenderness, guarding or rebound.     Hernia: No hernia is present.  Musculoskeletal:        General: No swelling, tenderness, deformity or signs of injury.     Cervical back: Normal range of motion and neck supple. No rigidity or tenderness.     Right lower leg: No edema.     Left lower leg: No edema.  Lymphadenopathy:     Cervical: No cervical  adenopathy.  Skin:    General: Skin is warm and dry.     Capillary Refill: Capillary refill takes less than 2 seconds.     Coloration: Skin is not jaundiced or pale.     Findings: No bruising, erythema, lesion or rash.  Neurological:     Mental Status: She is alert and oriented to person, place, and time.     Cranial Nerves: No cranial nerve deficit.     Sensory: No sensory deficit.  Motor: No weakness.     Coordination: Coordination normal.     Gait: Gait normal.     Deep Tendon Reflexes: Reflexes normal.  Psychiatric:        Mood and Affect: Mood normal.        Behavior: Behavior normal.        Thought Content: Thought content normal.        Judgment: Judgment normal.      No results found for any visits on 09/05/21.     Assessment & Plan:    Routine Health Maintenance and Physical Exam  Immunization History  Administered Date(s) Administered   Influenza,inj,Quad PF,6+ Mos 10/07/2017   Tdap 01/20/2018    Health Maintenance  Topic Date Due   OPHTHALMOLOGY EXAM  Never done   Hepatitis C Screening  Never done   INFLUENZA VACCINE  09/03/2021   HEMOGLOBIN A1C  03/06/2022   FOOT EXAM  04/20/2022   URINE MICROALBUMIN  09/04/2022   PAP SMEAR-Modifier  12/18/2023   TETANUS/TDAP  01/21/2028   HIV Screening  Completed   HPV VACCINES  Aged Out   COVID-19 Vaccine  Discontinued    Discussed health benefits of physical activity, and encouraged her to engage in regular exercise appropriate for her age and condition.  Problem List Items Addressed This Visit       Endocrine   Uncontrolled diabetes mellitus with hyperglycemia, without long-term current use of insulin ([place])    Lab Results  Component Value Date   HGBA1C 7.0 (H) 09/03/2021  Chronic condition much improved from previous A1c of 8.6 Wegovy still on back order, continue current dose of metformin 1079m BID avoid sugar sweets soda She is still lactating so cannot take a statin at this time Follow-up in 3  months      Hyperlipidemia associated with type 2 diabetes mellitus ([place]    Lab Results  Component Value Date   CHOL 171 09/03/2021   HDL 35 (L) 09/03/2021   LDLCALC 118 (H) 09/03/2021   TRIG 98 09/03/2021   CHOLHDL 4.9 (H) 09/03/2021  LDL much improved Still lactating Avoid fatty fried foods        Other   Annual physical exam    Annual exam as documented.  Counseling done include healthy lifestyle involving committing to 150 minutes of exercise per week, heart healthy diet, and attaining healthy weight. The importance of adequate sleep also discussed.  Regular use of seat belt and home safety were also discussed . Changes in health habits are decided on by patient with goals and time frames set for achieving them.       Vitamin D deficiency - Primary    This could be contributing to her fatigue ,start vitamin D 1000 units daily Last vitamin D Lab Results  Component Value Date   VD25OH 27.5 (L) 09/03/2021        Relevant Medications   Cholecalciferol (VITAMIN D3) 25 MCG (1000 UT) CAPS   Diarrhea    Intermittent diarrhea Denies abdominal pain, nausea, vomiting bloody stool Take Imodium 2 mg 4 times daily as needed Patient encouraged to drink at least 64 ounces of water daily to avoid dehydration      Relevant Medications   loperamide (IMODIUM A-D) 2 MG tablet   Return in about 3 months (around 12/06/2021) for Diabetes.     FRenee Rival FNP

## 2021-09-05 NOTE — Assessment & Plan Note (Signed)
Lab Results  Component Value Date   CHOL 171 09/03/2021   HDL 35 (L) 09/03/2021   LDLCALC 118 (H) 09/03/2021   TRIG 98 09/03/2021   CHOLHDL 4.9 (H) 09/03/2021  LDL much improved Still lactating Avoid fatty fried foods

## 2021-09-05 NOTE — Patient Instructions (Addendum)
Please start taking vitamin D 1000 units daily for your vitamin D deficiency.   Please take imodium, Take 1 tablet (2 mg total) by mouth 4 (four) times daily as needed for diarrhea     It is important that you exercise regularly at least 30 minutes 5 times a week.  Think about what you will eat, plan ahead. Choose " clean, green, fresh or frozen" over canned, processed or packaged foods which are more sugary, salty and fatty. 70 to 75% of food eaten should be vegetables and fruit. Three meals at set times with snacks allowed between meals, but they must be fruit or vegetables. Aim to eat over a 12 hour period , example 7 am to 7 pm, and STOP after  your last meal of the day. Drink water,generally about 64 ounces per day, no other drink is as healthy. Fruit juice is best enjoyed in a healthy way, by EATING the fruit.  Thanks for choosing [facility], we consider it a privelige to serve you.

## 2021-09-05 NOTE — Assessment & Plan Note (Signed)
Annual exam as documented.  Counseling done include healthy lifestyle involving committing to 150 minutes of exercise per week, heart healthy diet, and attaining healthy weight. The importance of adequate sleep also discussed.  Regular use of seat belt and home safety were also discussed . Changes in health habits are decided on by patient with goals and time frames set for achieving them.  

## 2021-09-21 ENCOUNTER — Other Ambulatory Visit: Payer: Self-pay | Admitting: Nurse Practitioner

## 2021-10-05 ENCOUNTER — Other Ambulatory Visit: Payer: Self-pay | Admitting: Nurse Practitioner

## 2021-10-05 DIAGNOSIS — E1165 Type 2 diabetes mellitus with hyperglycemia: Secondary | ICD-10-CM

## 2021-10-19 ENCOUNTER — Other Ambulatory Visit: Payer: Self-pay | Admitting: Nurse Practitioner

## 2021-11-08 ENCOUNTER — Encounter: Payer: Self-pay | Admitting: Nurse Practitioner

## 2021-11-26 ENCOUNTER — Telehealth: Payer: Self-pay

## 2021-11-26 NOTE — Telephone Encounter (Signed)
Patient called need to switch pharmacy going forward.  Pharmacy: [facility]

## 2021-11-26 NOTE — Telephone Encounter (Signed)
Noted, pharmacy updated in chart.

## 2021-12-04 ENCOUNTER — Other Ambulatory Visit: Payer: Self-pay | Admitting: Nurse Practitioner

## 2021-12-04 DIAGNOSIS — E1165 Type 2 diabetes mellitus with hyperglycemia: Secondary | ICD-10-CM

## 2021-12-12 ENCOUNTER — Encounter: Payer: Self-pay | Admitting: Family Medicine

## 2021-12-12 ENCOUNTER — Ambulatory Visit (INDEPENDENT_AMBULATORY_CARE_PROVIDER_SITE_OTHER): Payer: 59 | Admitting: Family Medicine

## 2021-12-12 ENCOUNTER — Ambulatory Visit: Payer: 59 | Admitting: Nurse Practitioner

## 2021-12-12 VITALS — BP 116/75 | HR 70 | Ht 62.0 in | Wt 180.0 lb

## 2021-12-12 DIAGNOSIS — E559 Vitamin D deficiency, unspecified: Secondary | ICD-10-CM | POA: Diagnosis not present

## 2021-12-12 DIAGNOSIS — E038 Other specified hypothyroidism: Secondary | ICD-10-CM | POA: Diagnosis not present

## 2021-12-12 DIAGNOSIS — E7849 Other hyperlipidemia: Secondary | ICD-10-CM

## 2021-12-12 DIAGNOSIS — E1165 Type 2 diabetes mellitus with hyperglycemia: Secondary | ICD-10-CM

## 2021-12-12 MED ORDER — SEMAGLUTIDE (2 MG/DOSE) 8 MG/3ML ~~LOC~~ SOPN: PEN_INJECTOR | SUBCUTANEOUS | 0 refills | Status: DC

## 2021-12-12 NOTE — Progress Notes (Signed)
Established Patient Office Visit  Subjective:  Patient ID: Jessica Carey, female    DOB: March 28, 1985  Age: 36 y.o. MRN: [id number]  CC:  Chief Complaint  Patient presents with   Follow-up    Following up for dm , has not been able to pick up medication for ozempic, needs refills.     HPI Jessica Carey is a 36 y.o. female with past medical history of A-fib, gestational hypertension, type 2 diabetes, palpitation, and abdominal trauma presents for f/u of  chronic medical conditions.  Type 2 diabetes: She takes Ozempic 2 mg subcu injection weekly and metformin 1000 mg twice daily.  She denies polyuria, polyphagia, but dyspnea.  She reports compliance with treatment regimen.  Past Medical History:  Diagnosis Date   ASCUS of cervix with negative high risk HPV 12/24/2020   12/24/20 repeat pap in 3 years per [facility],5 year risk CIN3+ is 0.40%   Boil, breast 07/04/2013   Diabetes ([place]) 08/04/2013   Diabetes mellitus without complication (HCC)    glyburide and insulin   Dysrhythmia    a-fib   Hyperlipidemia    Irregular periods     Past Surgical History:  Procedure Laterality Date   BREAST SURGERY Right    breast abcess   ELECTROPHYSIOLOGY STUDY N/A 03/22/2018   Procedure: ELECTROPHYSIOLOGY STUDY;  Surgeon: Evans Lance, MD;  Location: [facility];  Service: Cardiovascular;  Laterality: N/A;   PERINEAL LACERATION REPAIR N/A 03/17/2018   Procedure: SUTURE REPAIR PERINEAL LACERATION;  Surgeon: Osborne Oman, MD;  Location: [facility];  Service: Gynecology;  Laterality: N/A;   SURGERY OF LIP     SVT ABLATION N/A 03/22/2018   Procedure: SVT ABLATION;  Surgeon: Evans Lance, MD;  Location: [facility];  Service: Cardiovascular;  Laterality: N/A;    Family History  Problem Relation Age of Onset   Diabetes Paternal Grandmother    Diabetes Father    Hyperlipidemia Father    Diabetes Mother    Diabetes Sister    Cancer Maternal Aunt        liver     Social History   Socioeconomic History   Marital status: Single    Spouse name: Not on file   Number of children: 1   Years of education: Not on file   Highest education level: Not on file  Occupational History   Not on file  Tobacco Use   Smoking status: Never   Smokeless tobacco: Never  Vaping Use   Vaping Use: Never used  Substance and Sexual Activity   Alcohol use: Not Currently    Comment: occ.   Drug use: No   Sexual activity: Yes    Partners: Male    Birth control/protection: Other-see comments    Comment: Vasectomy  Other Topics Concern   Not on file  Social History Narrative   Not on file   Social Determinants of Health   Financial Resource Strain: Low Risk  (03/16/2018)   Overall Financial Resource Strain (CARDIA)    Difficulty of Paying Living Expenses: Not hard at all  Food Insecurity: No Food Insecurity (03/16/2018)   Hunger Vital Sign    Worried About Running Out of Food in the Last Year: Never true    [place] in the Last Year: Never true  Transportation Needs: Unknown (03/16/2018)   PRAPARE - Hydrologist (Medical): No    Lack of Transportation (Non-Medical): Not on file  Physical Activity: Not on file  Stress: Stress Concern Present (03/16/2018)   [place]    Feeling of Stress : Rather much  Social Connections: Not on file  Intimate Partner Violence: Not At Risk (03/16/2018)   Humiliation, Afraid, Rape, and Kick questionnaire    Fear of Current or Ex-Partner: No    Emotionally Abused: No    Physically Abused: No    Sexually Abused: No    Outpatient Medications Prior to Visit  Medication Sig Dispense Refill   blood glucose meter kit and supplies Dispense based on patient and insurance preference. Use up to four times daily as directed. (FOR ICD-10 E10.9, E11.9). 1 each 0   Cholecalciferol (VITAMIN D3) 25 MCG (1000 UT) CAPS Take 1 capsule  (1,000 Units total) by mouth daily. 60 capsule 3   Insulin Pen Needle (BD PEN NEEDLE NANO U/F) 32G X 4 MM MISC Use with Ozempic 100 each 1   Lancets (ONETOUCH DELICA PLUS [id number]) MISC USE AS DIRECTED FOUR TIMES A DAY 100 each 0   Omega 3 1000 MG CAPS Take 1 daily 60 capsule    ONETOUCH VERIO test strip USE AS DIRECTED TO TEST BLOOD SUGARS FOUR TIMES A DAY 100 strip 3   loperamide (IMODIUM A-D) 2 MG tablet Take 1 tablet (2 mg total) by mouth 4 (four) times daily as needed for diarrhea or loose stools. 30 tablet 0   metFORMIN (GLUCOPHAGE) 1000 MG tablet TAKE 1 TABLET (1,000 MG TOTAL) BY MOUTH TWICE A DAY WITH FOOD (Patient not taking: Reported on 12/12/2021) 180 tablet 2   OZEMPIC, 1 MG/DOSE, 4 MG/3ML SOPN INJECT 1 MG AS DIRECTED ONCE A WEEK FOR 28 DAYS. (Patient not taking: Reported on 12/12/2021) 3 mL 0   Semaglutide,0.25 or 0.5MG/DOS, (OZEMPIC, 0.25 OR 0.5 MG/DOSE,) 2 MG/1.5ML SOPN Inject 0.5 mg into the skin once a week. (Patient not taking: Reported on 12/12/2021) 3 mL 0   No facility-administered medications prior to visit.    No Known Allergies  ROS Review of Systems  Constitutional:  Negative for chills, fatigue and fever.  Respiratory:  Negative for chest tightness and shortness of breath.   Cardiovascular:  Negative for chest pain and palpitations.  Endocrine: Negative for polyphagia and polyuria.  Neurological:  Negative for dizziness and headaches.      Objective:    Physical Exam HENT:     Head: Normocephalic.     Right Ear: External ear normal.     Left Ear: External ear normal.  Cardiovascular:     Rate and Rhythm: Normal rate and regular rhythm.     Pulses: Normal pulses.     Heart sounds: Normal heart sounds.  Pulmonary:     Effort: No respiratory distress.  Neurological:     Mental Status: She is alert.     BP 116/75   Pulse 70   Ht _0  (1.575 m)   Wt 180 lb (81.6 kg)   SpO2 97%   BMI 32.92 kg/m  Wt Readings from Last 3 Encounters:  12/12/21 180 lb  (81.6 kg)  09/05/21 183 lb (83 kg)  06/05/21 180 lb (81.6 kg)    Lab Results  Component Value Date   TSH 0.981 09/03/2021   Lab Results  Component Value Date   WBC 12.1 (H) 09/03/2021   HGB 13.3 09/03/2021   HCT 40.0 09/03/2021   MCV 87 09/03/2021   PLT 230 09/03/2021   Lab Results  Component  Value Date   NA 136 09/03/2021   K 4.3 09/03/2021   CO2 24 09/03/2021   GLUCOSE 113 (H) 09/03/2021   BUN 14 09/03/2021   CREATININE 0.78 09/03/2021   BILITOT 0.6 09/03/2021   ALKPHOS 66 09/03/2021   AST 25 09/03/2021   ALT 34 (H) 09/03/2021   PROT 7.7 09/03/2021   ALBUMIN 4.2 09/03/2021   CALCIUM 9.2 09/03/2021   ANIONGAP 8 03/22/2018   EGFR 102 09/03/2021   Lab Results  Component Value Date   CHOL 171 09/03/2021   Lab Results  Component Value Date   HDL 35 (L) 09/03/2021   Lab Results  Component Value Date   LDLCALC 118 (H) 09/03/2021   Lab Results  Component Value Date   TRIG 98 09/03/2021   Lab Results  Component Value Date   CHOLHDL 4.9 (H) 09/03/2021   Lab Results  Component Value Date   HGBA1C 7.0 (H) 09/03/2021      Assessment & Plan:   Problem List Items Addressed This Visit       Endocrine   Uncontrolled diabetes mellitus with hyperglycemia, without long-term current use of insulin (HCC) - Primary    Refilled Ozempic 2 mg subcu injection weekly Encouraged to continue taking metformin 1000 mg daily Will assess hemoglobin A1c today Lab Results  Component Value Date   HGBA1C 7.0 (H) 09/03/2021         Relevant Medications   Semaglutide, 2 MG/DOSE, 8 MG/3ML SOPN   Other Relevant Orders   Hemoglobin A1c   CMP14+EGFR   CBC with Differential/Platelet     Other   Vitamin D deficiency   Relevant Orders   VITAMIN D 25 Hydroxy (Vit-D Deficiency, Fractures)   Other Visit Diagnoses     Other specified hypothyroidism       Relevant Orders   TSH + free T4   Other hyperlipidemia       Relevant Orders   Lipid panel       Meds ordered  this encounter  Medications   Semaglutide, 2 MG/DOSE, 8 MG/3ML SOPN    Sig: Inject 2 mg as directed once a week for 28 days, THEN 2 mg once a week for 28 days, THEN 2 mg once a week for 28 days.    Dispense:  3 mL    Refill:  0    Follow-up: Return in about 3 months (around 03/14/2022).    Alvira Monday, FNP

## 2021-12-12 NOTE — Assessment & Plan Note (Addendum)
Refilled Ozempic 2 mg subcu injection weekly Encouraged to continue taking metformin 1000 mg daily Will assess hemoglobin A1c today Lab Results  Component Value Date   HGBA1C 7.0 (H) 09/03/2021

## 2021-12-12 NOTE — Patient Instructions (Addendum)
I appreciate the opportunity to provide care to you today!    Follow up:  3 months  Labs: please stop by the lab  during the week to get your blood drawn (CBC, CMP, TSH, Lipid profile, HgA1c, Vit D)   Please pick up your medications at the pharmacy   Please continue to a heart-healthy diet and increase your physical activities. Try to exercise for 30mins at least three times a week.      It was a pleasure to see you and I look forward to continuing to work together on your health and well-being. Please do not hesitate to call the office if you need care or have questions about your care.   Have a wonderful day and week. With Gratitude, Tevita Gomer MSN, FNP-BC  

## 2021-12-21 LAB — CBC WITH DIFFERENTIAL/PLATELET
Basophils Absolute: 0.1 10*3/uL (ref 0.0–0.2)
Basos: 1 %
EOS (ABSOLUTE): 0.1 10*3/uL (ref 0.0–0.4)
Eos: 1 %
Hematocrit: 41.2 % (ref 34.0–46.6)
Hemoglobin: 13.4 g/dL (ref 11.1–15.9)
Immature Grans (Abs): 0 10*3/uL (ref 0.0–0.1)
Immature Granulocytes: 0 %
Lymphocytes Absolute: 3.8 10*3/uL — ABNORMAL HIGH (ref 0.7–3.1)
Lymphs: 34 %
MCH: 27.9 pg (ref 26.6–33.0)
MCHC: 32.5 g/dL (ref 31.5–35.7)
MCV: 86 fL (ref 79–97)
Monocytes Absolute: 0.5 10*3/uL (ref 0.1–0.9)
Monocytes: 5 %
Neutrophils Absolute: 6.6 10*3/uL (ref 1.4–7.0)
Neutrophils: 59 %
Platelets: 232 10*3/uL (ref 150–450)
RBC: 4.8 x10E6/uL (ref 3.77–5.28)
RDW: 13.1 % (ref 11.7–15.4)
WBC: 11.1 10*3/uL — ABNORMAL HIGH (ref 3.4–10.8)

## 2021-12-21 LAB — LIPID PANEL
Chol/HDL Ratio: 4.3 ratio (ref 0.0–4.4)
Cholesterol, Total: 162 mg/dL (ref 100–199)
HDL: 38 mg/dL — ABNORMAL LOW (ref >39)
LDL Chol Calc (NIH): 105 mg/dL — ABNORMAL HIGH (ref 0–99)
Triglycerides: 101 mg/dL (ref 0–149)
VLDL Cholesterol Cal: 19 mg/dL (ref 5–40)

## 2021-12-21 LAB — CMP14+EGFR
ALT: 23 IU/L (ref 0–32)
AST: 18 IU/L (ref 0–40)
Albumin/Globulin Ratio: 1.3 (ref 1.2–2.2)
Albumin: 4.2 g/dL (ref 3.9–4.9)
Alkaline Phosphatase: 67 IU/L (ref 44–121)
BUN/Creatinine Ratio: 19 (ref 9–23)
BUN: 15 mg/dL (ref 6–20)
Bilirubin Total: 0.4 mg/dL (ref 0.0–1.2)
CO2: 22 mmol/L (ref 20–29)
Calcium: 9.9 mg/dL (ref 8.7–10.2)
Chloride: 99 mmol/L (ref 96–106)
Creatinine, Ser: 0.8 mg/dL (ref 0.57–1.00)
Globulin, Total: 3.2 g/dL (ref 1.5–4.5)
Glucose: 128 mg/dL — ABNORMAL HIGH (ref 70–99)
Potassium: 4.3 mmol/L (ref 3.5–5.2)
Sodium: 134 mmol/L (ref 134–144)
Total Protein: 7.4 g/dL (ref 6.0–8.5)
eGFR: 98 mL/min/{1.73_m2} (ref >59)

## 2021-12-21 LAB — VITAMIN D 25 HYDROXY (VIT D DEFICIENCY, FRACTURES): Vit D, 25-Hydroxy: 37.3 ng/mL (ref 30.0–100.0)

## 2021-12-21 LAB — TSH+FREE T4
Free T4: 1.06 ng/dL (ref 0.82–1.77)
TSH: 0.73 u[IU]/mL (ref 0.450–4.500)

## 2021-12-21 LAB — HEMOGLOBIN A1C
Est. average glucose Bld gHb Est-mCnc: 148 mg/dL
Hgb A1c MFr Bld: 6.8 % — ABNORMAL HIGH (ref 4.8–5.6)

## 2022-01-03 ENCOUNTER — Other Ambulatory Visit: Payer: Self-pay | Admitting: Family Medicine

## 2022-01-03 DIAGNOSIS — E1165 Type 2 diabetes mellitus with hyperglycemia: Secondary | ICD-10-CM

## 2022-02-04 ENCOUNTER — Other Ambulatory Visit: Payer: Self-pay | Admitting: Family Medicine

## 2022-02-04 DIAGNOSIS — E1165 Type 2 diabetes mellitus with hyperglycemia: Secondary | ICD-10-CM

## 2022-02-27 ENCOUNTER — Other Ambulatory Visit: Payer: Self-pay | Admitting: Nurse Practitioner

## 2022-03-14 ENCOUNTER — Ambulatory Visit (INDEPENDENT_AMBULATORY_CARE_PROVIDER_SITE_OTHER): Payer: 59 | Admitting: Family Medicine

## 2022-03-14 ENCOUNTER — Encounter: Payer: Self-pay | Admitting: Family Medicine

## 2022-03-14 VITALS — BP 105/72 | HR 100 | Resp 16 | Ht 66.0 in | Wt 182.0 lb

## 2022-03-14 DIAGNOSIS — E038 Other specified hypothyroidism: Secondary | ICD-10-CM | POA: Diagnosis not present

## 2022-03-14 DIAGNOSIS — E1165 Type 2 diabetes mellitus with hyperglycemia: Secondary | ICD-10-CM

## 2022-03-14 DIAGNOSIS — E559 Vitamin D deficiency, unspecified: Secondary | ICD-10-CM | POA: Diagnosis not present

## 2022-03-14 DIAGNOSIS — E7849 Other hyperlipidemia: Secondary | ICD-10-CM | POA: Diagnosis not present

## 2022-03-14 DIAGNOSIS — E119 Type 2 diabetes mellitus without complications: Secondary | ICD-10-CM

## 2022-03-14 DIAGNOSIS — Z1159 Encounter for screening for other viral diseases: Secondary | ICD-10-CM

## 2022-03-14 DIAGNOSIS — R7301 Impaired fasting glucose: Secondary | ICD-10-CM

## 2022-03-14 MED ORDER — OZEMPIC (2 MG/DOSE) 8 MG/3ML ~~LOC~~ SOPN: PEN_INJECTOR | SUBCUTANEOUS | 2 refills | Status: DC

## 2022-03-14 MED ORDER — BLOOD GLUCOSE METER KIT: PACK | 0 refills | Status: AC

## 2022-03-14 NOTE — Progress Notes (Signed)
Established Patient Office Visit  Subjective:  Patient ID: Jessica Carey, female    DOB: March 22, 1985  Age: 37 y.o. MRN: [id number]  CC:  Chief Complaint  Patient presents with   Diabetes    3 month follow up     HPI Jessica Carey is a 37 y.o. female with past medical history of A-fib, SVT, type 2 diabetes, vitamin D deficiency, and obesity presents for f/u of  chronic medical conditions. For the details of today's visit, please refer to the assessment and plan.     Past Medical History:  Diagnosis Date   ASCUS of cervix with negative high risk HPV 12/24/2020   12/24/20 repeat pap in 3 years per [facility],5 year risk CIN3+ is 0.40%   Boil, breast 07/04/2013   Diabetes ([place]) 08/04/2013   Diabetes mellitus without complication (HCC)    glyburide and insulin   Dysrhythmia    a-fib   Hyperlipidemia    Irregular periods     Past Surgical History:  Procedure Laterality Date   BREAST SURGERY Right    breast abcess   ELECTROPHYSIOLOGY STUDY N/A 03/22/2018   Procedure: ELECTROPHYSIOLOGY STUDY;  Surgeon: Evans Lance, MD;  Location: [facility];  Service: Cardiovascular;  Laterality: N/A;   PERINEAL LACERATION REPAIR N/A 03/17/2018   Procedure: SUTURE REPAIR PERINEAL LACERATION;  Surgeon: Osborne Oman, MD;  Location: [facility];  Service: Gynecology;  Laterality: N/A;   SURGERY OF LIP     SVT ABLATION N/A 03/22/2018   Procedure: SVT ABLATION;  Surgeon: Evans Lance, MD;  Location: [facility];  Service: Cardiovascular;  Laterality: N/A;    Family History  Problem Relation Age of Onset   Diabetes Paternal Grandmother    Diabetes Father    Hyperlipidemia Father    Diabetes Mother    Diabetes Sister    Cancer Maternal Aunt        liver    Social History   Socioeconomic History   Marital status: Single    Spouse name: Not on file   Number of children: 1   Years of education: Not on file   Highest education level: Not on file  Occupational  History   Not on file  Tobacco Use   Smoking status: Never   Smokeless tobacco: Never  Vaping Use   Vaping Use: Never used  Substance and Sexual Activity   Alcohol use: Not Currently    Comment: occ.   Drug use: No   Sexual activity: Yes    Partners: Male    Birth control/protection: Other-see comments    Comment: Vasectomy  Other Topics Concern   Not on file  Social History Narrative   Not on file   Social Determinants of Health   Financial Resource Strain: Low Risk  (03/16/2018)   Overall Financial Resource Strain (CARDIA)    Difficulty of Paying Living Expenses: Not hard at all  Food Insecurity: No Food Insecurity (03/16/2018)   Hunger Vital Sign    Worried About Running Out of Food in the Last Year: Never true    [place] in the Last Year: Never true  Transportation Needs: Unknown (03/16/2018)   PRAPARE - Hydrologist (Medical): No    Lack of Transportation (Non-Medical): Not on file  Physical Activity: Not on file  Stress: Stress Concern Present (03/16/2018)   [place]    Feeling of Stress :  Rather much  Social Connections: Not on file  Intimate Partner Violence: Not At Risk (03/16/2018)   Humiliation, Afraid, Rape, and Kick questionnaire    Fear of Current or Ex-Partner: No    Emotionally Abused: No    Physically Abused: No    Sexually Abused: No    Outpatient Medications Prior to Visit  Medication Sig Dispense Refill   Cholecalciferol (VITAMIN D3) 25 MCG (1000 UT) CAPS Take 1 capsule (1,000 Units total) by mouth daily. 60 capsule 3   Insulin Pen Needle (BD PEN NEEDLE NANO U/F) 32G X 4 MM MISC Use with Ozempic 100 each 1   Lancets (ONETOUCH DELICA PLUS 123XX123) MISC USE AS DIRECTED FOUR TIMES A DAY 100 each 0   Omega 3 1000 MG CAPS Take 1 daily 60 capsule    ONETOUCH VERIO test strip USE AS DIRECTED TO TEST BLOOD SUGARS FOUR TIMES A DAY 100 strip 3   blood  glucose meter kit and supplies Dispense based on patient and insurance preference. Use up to four times daily as directed. (FOR ICD-10 E10.9, E11.9). 1 each 0   OZEMPIC, 2 MG/DOSE, 8 MG/3ML SOPN INJECT 2 MG SUBCUTANEOUSLY ONCE A WEEK FOR 28 DAYS 3 mL 0   metFORMIN (GLUCOPHAGE) 1000 MG tablet TAKE 1 TABLET (1,000 MG TOTAL) BY MOUTH TWICE A DAY WITH FOOD (Patient not taking: Reported on 12/12/2021) 180 tablet 2   No facility-administered medications prior to visit.    No Known Allergies  ROS Review of Systems  Constitutional:  Negative for chills and fever.  Eyes:  Negative for pain and itching.  Respiratory:  Negative for chest tightness and shortness of breath.   Endocrine: Negative for polydipsia, polyphagia and polyuria.      Objective:    Physical Exam HENT:     Head: Normocephalic.     Mouth/Throat:     Mouth: Mucous membranes are moist.  Cardiovascular:     Rate and Rhythm: Normal rate.     Heart sounds: Normal heart sounds.  Pulmonary:     Effort: Pulmonary effort is normal.     Breath sounds: Normal breath sounds.  Neurological:     Mental Status: She is alert.     BP 105/72   Pulse 100   Resp 16   Ht 5' 6"$  (1.676 m)   Wt 182 lb (82.6 kg)   SpO2 98%   BMI 29.38 kg/m  Wt Readings from Last 3 Encounters:  03/14/22 182 lb (82.6 kg)  12/12/21 180 lb (81.6 kg)  09/05/21 183 lb (83 kg)    Lab Results  Component Value Date   TSH 0.730 12/20/2021   Lab Results  Component Value Date   WBC 11.1 (H) 12/20/2021   HGB 13.4 12/20/2021   HCT 41.2 12/20/2021   MCV 86 12/20/2021   PLT 232 12/20/2021   Lab Results  Component Value Date   NA 134 12/20/2021   K 4.3 12/20/2021   CO2 22 12/20/2021   GLUCOSE 128 (H) 12/20/2021   BUN 15 12/20/2021   CREATININE 0.80 12/20/2021   BILITOT 0.4 12/20/2021   ALKPHOS 67 12/20/2021   AST 18 12/20/2021   ALT 23 12/20/2021   PROT 7.4 12/20/2021   ALBUMIN 4.2 12/20/2021   CALCIUM 9.9 12/20/2021   ANIONGAP 8 03/22/2018    EGFR 98 12/20/2021   Lab Results  Component Value Date   CHOL 162 12/20/2021   Lab Results  Component Value Date   HDL 38 (L) 12/20/2021  Lab Results  Component Value Date   LDLCALC 105 (H) 12/20/2021   Lab Results  Component Value Date   TRIG 101 12/20/2021   Lab Results  Component Value Date   CHOLHDL 4.3 12/20/2021   Lab Results  Component Value Date   HGBA1C 6.8 (H) 12/20/2021      Assessment & Plan:  Type 2 diabetes mellitus without complication, without long-term current use of insulin ([place]) Assessment & Plan: She takes Ozempic 2 mg weekly She denies polyuria, polyphagia, polydipsia She reports not taking metformin at 1000 mg twice daily Will discontinue metformin 1000 mg Encouraged patient to take Ozempic 2 mg weekly Pending hemoglobin A1c today Lab Results  Component Value Date   HGBA1C 6.8 (H) 12/20/2021      Uncontrolled diabetes mellitus with hyperglycemia, without long-term current use of insulin (HCC) -     blood glucose meter kit and supplies; Dispense based on patient and insurance preference. Use up to four times daily as directed. (FOR ICD-10 E10.9, E11.9).  Dispense: 1 each; Refill: 0 -     Ozempic (2 MG/DOSE); INJECT 2 MG SUBCUTANEOUSLY ONCE A WEEK FOR 28 DAYS  Dispense: 3 mL; Refill: 2  IFG (impaired fasting glucose) -     Hemoglobin A1c  Vitamin D deficiency -     VITAMIN D 25 Hydroxy (Vit-D Deficiency, Fractures)  Other specified hypothyroidism -     TSH + free T4  Other hyperlipidemia -     Lipid panel -     CMP14+EGFR -     CBC with Differential/Platelet  Need for hepatitis C screening test -     Hepatitis C antibody    Follow-up: Return in about 3 months (around 06/12/2022).   Alvira Monday, FNP

## 2022-03-14 NOTE — Patient Instructions (Addendum)
I appreciate the opportunity to provide care to you today!    Follow up:  3 months  Labs: please stop by the lab during the week to get your blood drawn (CBC, CMP, TSH, Lipid profile, HgA1c, Vit D)   Please pick up your prescriptions at the pharmacy   Please continue to a heart-healthy diet and increase your physical activities. Try to exercise for 37mns at least five times a week.   Physical activity helps: Lower your blood glucose, improve your heart health, lower your blood pressure and cholesterol, burn calories to help manage her weight, gave you energy, lower stress, and improve his sleep.  The American diabetes Association (ADA) recommends being active for 2-1/2 hours (150 minutes) or more week.  Exercise for 30 minutes, 5 days a week (150 minutes total)    It was a pleasure to see you and I look forward to continuing to work together on your health and well-being. Please do not hesitate to call the office if you need care or have questions about your care.   Have a wonderful day and week. With Gratitude, GAlvira MondayMSN, FNP-BC

## 2022-03-14 NOTE — Assessment & Plan Note (Signed)
She takes Ozempic 2 mg weekly She denies polyuria, polyphagia, polydipsia She reports not taking metformin at 1000 mg twice daily Will discontinue metformin 1000 mg Encouraged patient to take Ozempic 2 mg weekly Pending hemoglobin A1c today Lab Results  Component Value Date   HGBA1C 6.8 (H) 12/20/2021

## 2022-03-22 LAB — HEPATITIS C ANTIBODY: Hep C Virus Ab: NONREACTIVE

## 2022-03-26 ENCOUNTER — Other Ambulatory Visit: Payer: Self-pay | Admitting: Nurse Practitioner

## 2022-04-14 ENCOUNTER — Encounter: Payer: Self-pay | Admitting: Family Medicine

## 2022-05-21 ENCOUNTER — Encounter: Payer: Self-pay | Admitting: Internal Medicine

## 2022-05-21 ENCOUNTER — Ambulatory Visit (INDEPENDENT_AMBULATORY_CARE_PROVIDER_SITE_OTHER): Payer: 59 | Admitting: Internal Medicine

## 2022-05-21 VITALS — BP 104/75 | HR 76 | Ht 66.0 in | Wt 186.2 lb

## 2022-05-21 DIAGNOSIS — N3000 Acute cystitis without hematuria: Secondary | ICD-10-CM | POA: Diagnosis not present

## 2022-05-21 DIAGNOSIS — K219 Gastro-esophageal reflux disease without esophagitis: Secondary | ICD-10-CM | POA: Diagnosis not present

## 2022-05-21 LAB — POCT URINALYSIS DIP (CLINITEK)
Bilirubin, UA: NEGATIVE
Glucose, UA: NEGATIVE mg/dL
Ketones, POC UA: NEGATIVE mg/dL
Leukocytes, UA: NEGATIVE
Nitrite, UA: NEGATIVE
POC PROTEIN,UA: NEGATIVE
Spec Grav, UA: >=1.03 — AB (ref 1.010–1.025)
Urobilinogen, UA: 0.2 E.U./dL
pH, UA: 5.5 (ref 5.0–8.0)

## 2022-05-21 MED ORDER — OMEPRAZOLE 40 MG PO CPDR: 40.0000 mg | DELAYED_RELEASE_CAPSULE | Freq: Every day | ORAL | 2 refills | Status: DC

## 2022-05-21 MED ORDER — NITROFURANTOIN MONOHYD MACRO 100 MG PO CAPS: 100.0000 mg | ORAL_CAPSULE | Freq: Two times a day (BID) | ORAL | 0 refills | Status: DC

## 2022-05-21 NOTE — Patient Instructions (Signed)
Please take Macrobid as prescribed.  Please start taking Omeprazole once daily for 1 week and then as needed for acid reflux/heartburn.  Please avoid hot and spicy food. Please take dinner at least 2 hours before bedtime.

## 2022-05-21 NOTE — Assessment & Plan Note (Signed)
Epigastric discomfort could be due to GERD/gastritis Started omeprazole Avoid hot and spicy food Advised to avoid caffeinated products and avoid lying down immediately after having meal

## 2022-05-21 NOTE — Assessment & Plan Note (Signed)
UA reviewed, showed moderate RBC Check urine culture Considering her dysuria and suprapubic pain, started empiric Macrobid Advised to increase fluid intake

## 2022-05-21 NOTE — Progress Notes (Signed)
Acute Office Visit  Subjective:    Patient ID: Jessica Carey, female    DOB: 09-23-1985, 37 y.o.   MRN: [id number]  Chief Complaint  Patient presents with   Abdominal Pain    Patient has abdominal pain for a week. She states she has a foul odor when urinating and feels like she can't complete the urination.    HPI Patient is in today for complaint of epigastric and suprapubic abdominal pain for the last 1 week.  She also reports foul-smelling urine and incomplete urination.  Denies fever, chills, nausea, vomiting or hematuria.  Denies any history of urinary stones.  Her UA showed blood.  She has intermittent epigastric pain as well, and feels bloated at times.  Denies any constipation or diarrhea.  Past Medical History:  Diagnosis Date   ASCUS of cervix with negative high risk HPV 12/24/2020   12/24/20 repeat pap in 3 years per [facility],5 year risk CIN3+ is 0.40%   Boil, breast 07/04/2013   Diabetes 08/04/2013   Diabetes mellitus without complication    glyburide and insulin   Dysrhythmia    a-fib   Hyperlipidemia    Irregular periods     Past Surgical History:  Procedure Laterality Date   BREAST SURGERY Right    breast abcess   ELECTROPHYSIOLOGY STUDY N/A 03/22/2018   Procedure: ELECTROPHYSIOLOGY STUDY;  Surgeon: Marinus Maw, MD;  Location: [facility];  Service: Cardiovascular;  Laterality: N/A;   PERINEAL LACERATION REPAIR N/A 03/17/2018   Procedure: SUTURE REPAIR PERINEAL LACERATION;  Surgeon: Tereso Newcomer, MD;  Location: [facility];  Service: Gynecology;  Laterality: N/A;   SURGERY OF LIP     SVT ABLATION N/A 03/22/2018   Procedure: SVT ABLATION;  Surgeon: Marinus Maw, MD;  Location: [facility];  Service: Cardiovascular;  Laterality: N/A;    Family History  Problem Relation Age of Onset   Diabetes Paternal Grandmother    Diabetes Father    Hyperlipidemia Father    Diabetes Mother    Diabetes Sister    Cancer Maternal Aunt         liver    Social History   Socioeconomic History   Marital status: Single    Spouse name: Not on file   Number of children: 1   Years of education: Not on file   Highest education level: Not on file  Occupational History   Not on file  Tobacco Use   Smoking status: Never   Smokeless tobacco: Never  Vaping Use   Vaping Use: Never used  Substance and Sexual Activity   Alcohol use: Not Currently    Comment: occ.   Drug use: No   Sexual activity: Yes    Partners: Male    Birth control/protection: Other-see comments    Comment: Vasectomy  Other Topics Concern   Not on file  Social History Narrative   Not on file   Social Determinants of Health   Financial Resource Strain: Low Risk  (03/16/2018)   Overall Financial Resource Strain (CARDIA)    Difficulty of Paying Living Expenses: Not hard at all  Food Insecurity: No Food Insecurity (03/16/2018)   Hunger Vital Sign    Worried About Running Out of Food in the Last Year: Never true    Ran Out of Food in the Last Year: Never true  Transportation Needs: Unknown (03/16/2018)   PRAPARE - Transportation    Lack of Transportation (Medical): No    Lack  of Transportation (Non-Medical): Not on file  Physical Activity: Not on file  Stress: Stress Concern Present (03/16/2018)   [employer] of Occupational Health - Occupational Stress Questionnaire    Feeling of Stress : Rather much  Social Connections: Not on file  Intimate Partner Violence: Not At Risk (03/16/2018)   Humiliation, Afraid, Rape, and Kick questionnaire    Fear of Current or Ex-Partner: No    Emotionally Abused: No    Physically Abused: No    Sexually Abused: No    Outpatient Medications Prior to Visit  Medication Sig Dispense Refill   blood glucose meter kit and supplies Dispense based on patient and insurance preference. Use up to four times daily as directed. (FOR ICD-10 E10.9, E11.9). 1 each 0   Cholecalciferol (VITAMIN D3) 25 MCG (1000 UT) CAPS Take 1  capsule (1,000 Units total) by mouth daily. 60 capsule 3   Insulin Pen Needle (BD PEN NEEDLE NANO U/F) 32G X 4 MM MISC Use with Ozempic 100 each 1   Lancets (ONETOUCH DELICA PLUS LANCET33G) MISC USE AS DIRECTED FOUR TIMES A DAY 100 each 0   metFORMIN (GLUCOPHAGE) 1000 MG tablet TAKE 1 TABLET (1,000 MG TOTAL) BY MOUTH TWICE A DAY WITH FOOD (Patient not taking: Reported on 12/12/2021) 180 tablet 2   Omega 3 1000 MG CAPS Take 1 daily 60 capsule    ONETOUCH VERIO test strip USE AS DIRECTED TO TEST BLOOD SUGARS FOUR TIMES A DAY 100 strip 3   Semaglutide, 2 MG/DOSE, (OZEMPIC, 2 MG/DOSE,) 8 MG/3ML SOPN INJECT 2 MG SUBCUTANEOUSLY ONCE A WEEK FOR 28 DAYS 3 mL 2   No facility-administered medications prior to visit.    No Known Allergies  Review of Systems  Constitutional:  Negative for chills and fever.  HENT:  Negative for congestion, sinus pressure, sinus pain and trouble swallowing.   Respiratory:  Negative for cough and shortness of breath.   Cardiovascular:  Negative for chest pain and palpitations.  Gastrointestinal:  Positive for abdominal pain. Negative for nausea and vomiting.  Genitourinary:  Positive for dysuria. Negative for hematuria.  Musculoskeletal:  Negative for neck pain and neck stiffness.  Skin:  Negative for rash.  Neurological:  Negative for dizziness and weakness.  Psychiatric/Behavioral:  Negative for agitation and behavioral problems.        Objective:    Physical Exam Vitals reviewed.  Constitutional:      General: She is not in acute distress.    Appearance: She is not diaphoretic.  Eyes:     General: No scleral icterus. Cardiovascular:     Rate and Rhythm: Regular rhythm.     Pulses: Normal pulses.     Heart sounds: No murmur heard. Pulmonary:     Breath sounds: Normal breath sounds. No wheezing or rales.  Abdominal:     Palpations: Abdomen is soft.     Tenderness: There is abdominal tenderness (Mild, suprapubic). There is no right CVA tenderness or left  CVA tenderness.  Musculoskeletal:     Cervical back: Neck supple. No tenderness.     Right lower leg: No edema.     Left lower leg: No edema.  Skin:    General: Skin is warm.     Findings: No rash.  Neurological:     General: No focal deficit present.     Mental Status: She is alert and oriented to person, place, and time.  Psychiatric:        Mood and Affect: Mood normal.  Behavior: Behavior normal.     BP 104/75 (BP Location: Left Arm, Patient Position: Sitting, Cuff Size: Normal)   Pulse 76   Ht  (1.676 m)   Wt 186 lb 3.2 oz (84.5 kg)   SpO2 98%   BMI 30.05 kg/m  Wt Readings from Last 3 Encounters:  05/21/22 186 lb 3.2 oz (84.5 kg)  03/14/22 182 lb (82.6 kg)  12/12/21 180 lb (81.6 kg)        Assessment & Plan:   Problem List Items Addressed This Visit       Digestive   Gastroesophageal reflux disease    Epigastric discomfort could be due to GERD/gastritis Started omeprazole Avoid hot and spicy food Advised to avoid caffeinated products and avoid lying down immediately after having meal      Relevant Medications   omeprazole (PRILOSEC) 40 MG capsule     Genitourinary   Acute cystitis without hematuria - Primary    UA reviewed, showed moderate RBC Check urine culture Considering her dysuria and suprapubic pain, started empiric Macrobid Advised to increase fluid intake      Relevant Medications   nitrofurantoin, macrocrystal-monohydrate, (MACROBID) 100 MG capsule   Other Relevant Orders   POCT URINALYSIS DIP (CLINITEK) (Completed)   Urine Culture     Meds ordered this encounter  Medications   nitrofurantoin, macrocrystal-monohydrate, (MACROBID) 100 MG capsule    Sig: Take 1 capsule (100 mg total) by mouth 2 (two) times daily.    Dispense:  10 capsule    Refill:  0   omeprazole (PRILOSEC) 40 MG capsule    Sig: Take 1 capsule (40 mg total) by mouth daily.    Dispense:  30 capsule    Refill:  2     Anacleto Batterman Concha Se, MD

## 2022-05-22 LAB — CBC WITH DIFFERENTIAL/PLATELET
Basophils Absolute: 0.1 10*3/uL (ref 0.0–0.2)
Basos: 1 %
EOS (ABSOLUTE): 0.2 10*3/uL (ref 0.0–0.4)
Eos: 2 %
Hematocrit: 40.7 % (ref 34.0–46.6)
Hemoglobin: 13.4 g/dL (ref 11.1–15.9)
Immature Grans (Abs): 0.1 10*3/uL (ref 0.0–0.1)
Immature Granulocytes: 1 %
Lymphocytes Absolute: 3.2 10*3/uL — ABNORMAL HIGH (ref 0.7–3.1)
Lymphs: 31 %
MCH: 28.8 pg (ref 26.6–33.0)
MCHC: 32.9 g/dL (ref 31.5–35.7)
MCV: 87 fL (ref 79–97)
Monocytes Absolute: 0.5 10*3/uL (ref 0.1–0.9)
Monocytes: 5 %
Neutrophils Absolute: 6.1 10*3/uL (ref 1.4–7.0)
Neutrophils: 60 %
Platelets: 244 10*3/uL (ref 150–450)
RBC: 4.66 x10E6/uL (ref 3.77–5.28)
RDW: 12.9 % (ref 11.7–15.4)
WBC: 10.1 10*3/uL (ref 3.4–10.8)

## 2022-05-22 LAB — CMP14+EGFR
ALT: 35 IU/L — ABNORMAL HIGH (ref 0–32)
AST: 18 IU/L (ref 0–40)
Albumin/Globulin Ratio: 1.3 (ref 1.2–2.2)
Albumin: 4 g/dL (ref 3.9–4.9)
Alkaline Phosphatase: 84 IU/L (ref 44–121)
BUN/Creatinine Ratio: 21 (ref 9–23)
BUN: 16 mg/dL (ref 6–20)
Bilirubin Total: 0.4 mg/dL (ref 0.0–1.2)
CO2: 22 mmol/L (ref 20–29)
Calcium: 9 mg/dL (ref 8.7–10.2)
Chloride: 99 mmol/L (ref 96–106)
Creatinine, Ser: 0.78 mg/dL (ref 0.57–1.00)
Globulin, Total: 3.2 g/dL (ref 1.5–4.5)
Glucose: 160 mg/dL — ABNORMAL HIGH (ref 70–99)
Potassium: 4.8 mmol/L (ref 3.5–5.2)
Sodium: 134 mmol/L (ref 134–144)
Total Protein: 7.2 g/dL (ref 6.0–8.5)
eGFR: 102 mL/min/{1.73_m2} (ref >59)

## 2022-05-22 LAB — LIPID PANEL
Chol/HDL Ratio: 4.6 ratio — ABNORMAL HIGH (ref 0.0–4.4)
Cholesterol, Total: 166 mg/dL (ref 100–199)
HDL: 36 mg/dL — ABNORMAL LOW (ref >39)
LDL Chol Calc (NIH): 107 mg/dL — ABNORMAL HIGH (ref 0–99)
Triglycerides: 126 mg/dL (ref 0–149)
VLDL Cholesterol Cal: 23 mg/dL (ref 5–40)

## 2022-05-22 LAB — TSH+FREE T4
Free T4: 1.16 ng/dL (ref 0.82–1.77)
TSH: 1.25 u[IU]/mL (ref 0.450–4.500)

## 2022-05-22 LAB — VITAMIN D 25 HYDROXY (VIT D DEFICIENCY, FRACTURES): Vit D, 25-Hydroxy: 25 ng/mL — ABNORMAL LOW (ref 30.0–100.0)

## 2022-05-22 LAB — HEMOGLOBIN A1C
Est. average glucose Bld gHb Est-mCnc: 169 mg/dL
Hgb A1c MFr Bld: 7.5 % — ABNORMAL HIGH (ref 4.8–5.6)

## 2022-05-23 LAB — URINE CULTURE

## 2022-05-26 ENCOUNTER — Other Ambulatory Visit: Payer: Self-pay | Admitting: Family Medicine

## 2022-05-26 DIAGNOSIS — E1165 Type 2 diabetes mellitus with hyperglycemia: Secondary | ICD-10-CM

## 2022-05-26 MED ORDER — METFORMIN HCL 1000 MG PO TABS: ORAL_TABLET | ORAL | 2 refills | Status: DC

## 2022-05-26 NOTE — Progress Notes (Unsigned)
The ASCVD Risk score (Arnett DK, et al., 2019) failed to calculate for the following reasons:    The 2019 ASCVD risk score is only valid for ages 40 to 79

## 2022-06-12 ENCOUNTER — Ambulatory Visit: Payer: 59 | Admitting: Family Medicine

## 2022-06-19 ENCOUNTER — Other Ambulatory Visit: Payer: Self-pay | Admitting: Family Medicine

## 2022-06-19 DIAGNOSIS — E1165 Type 2 diabetes mellitus with hyperglycemia: Secondary | ICD-10-CM

## 2022-07-04 ENCOUNTER — Ambulatory Visit (INDEPENDENT_AMBULATORY_CARE_PROVIDER_SITE_OTHER): Payer: 59 | Admitting: Family Medicine

## 2022-07-04 ENCOUNTER — Ambulatory Visit: Payer: 59 | Admitting: Family Medicine

## 2022-07-04 ENCOUNTER — Encounter: Payer: Self-pay | Admitting: Family Medicine

## 2022-07-04 VITALS — BP 105/72 | HR 80 | Ht 66.0 in | Wt 186.1 lb

## 2022-07-04 DIAGNOSIS — E1165 Type 2 diabetes mellitus with hyperglycemia: Secondary | ICD-10-CM | POA: Diagnosis not present

## 2022-07-04 DIAGNOSIS — E559 Vitamin D deficiency, unspecified: Secondary | ICD-10-CM | POA: Diagnosis not present

## 2022-07-04 DIAGNOSIS — K219 Gastro-esophageal reflux disease without esophagitis: Secondary | ICD-10-CM | POA: Diagnosis not present

## 2022-07-04 DIAGNOSIS — Z7984 Long term (current) use of oral hypoglycemic drugs: Secondary | ICD-10-CM

## 2022-07-04 DIAGNOSIS — E119 Type 2 diabetes mellitus without complications: Secondary | ICD-10-CM

## 2022-07-04 MED ORDER — GLIMEPIRIDE 2 MG PO TABS: 2.0000 mg | ORAL_TABLET | Freq: Every day | ORAL | 3 refills | Status: DC

## 2022-07-04 MED ORDER — ONETOUCH VERIO VI STRP: ORAL_STRIP | 3 refills | Status: DC

## 2022-07-04 NOTE — Assessment & Plan Note (Signed)
Encouraged to continue taking her weekly vitamin D supplement 

## 2022-07-04 NOTE — Assessment & Plan Note (Signed)
She reports not taking metformin 1000 mg twice daily due to side effects of diarrhea She has only been taking Ozempic 2 mg weekly Reports elevated blood sugars, the highest is 300 postprandial and the lowest is 84 Denies polyuria, polyphagia, polydipsia Will start the patient on glimepiride 2 mg daily Educated the patient on glimepiride increases insulin production thereby decreasing blood glucose Encouraged to continue monitoring her blood pressure 3 times daily

## 2022-07-04 NOTE — Progress Notes (Signed)
Established Patient Office Visit  Subjective:  Patient ID: Jessica Carey, female    DOB: 10/14/1985  Age: 37 y.o. MRN: [id number]  CC:  Chief Complaint  Patient presents with   Chronic Care Management    Follow up appt no health concerns.     HPI Jessica Carey is a 36 y.o. female with past medical history of type 2 diabetes, GERD, and vitamin D deficiency presents for f/u of chronic medical conditions. For the details of today's visit, please refer to the assessment and plan.     Past Medical History:  Diagnosis Date   ASCUS of cervix with negative high risk HPV 12/24/2020   12/24/20 repeat pap in 3 years per [facility],5 year risk CIN3+ is 0.40%   Boil, breast 07/04/2013   Diabetes (HCC) 08/04/2013   Diabetes mellitus without complication (HCC)    glyburide and insulin   Dysrhythmia    a-fib   Hyperlipidemia    Irregular periods     Past Surgical History:  Procedure Laterality Date   BREAST SURGERY Right    breast abcess   ELECTROPHYSIOLOGY STUDY N/A 03/22/2018   Procedure: ELECTROPHYSIOLOGY STUDY;  Surgeon: Marinus Maw, MD;  Location: [facility];  Service: Cardiovascular;  Laterality: N/A;   PERINEAL LACERATION REPAIR N/A 03/17/2018   Procedure: SUTURE REPAIR PERINEAL LACERATION;  Surgeon: Tereso Newcomer, MD;  Location: [facility];  Service: Gynecology;  Laterality: N/A;   SURGERY OF LIP     SVT ABLATION N/A 03/22/2018   Procedure: SVT ABLATION;  Surgeon: Marinus Maw, MD;  Location: [facility];  Service: Cardiovascular;  Laterality: N/A;    Family History  Problem Relation Age of Onset   Diabetes Paternal Grandmother    Diabetes Father    Hyperlipidemia Father    Diabetes Mother    Diabetes Sister    Cancer Maternal Aunt        liver    Social History   Socioeconomic History   Marital status: Single    Spouse name: Not on file   Number of children: 1   Years of education: Not on file   Highest education level: Not on file   Occupational History   Not on file  Tobacco Use   Smoking status: Never   Smokeless tobacco: Never  Vaping Use   Vaping Use: Never used  Substance and Sexual Activity   Alcohol use: Not Currently    Comment: occ.   Drug use: No   Sexual activity: Yes    Partners: Male    Birth control/protection: Other-see comments    Comment: Vasectomy  Other Topics Concern   Not on file  Social History Narrative   Not on file   Social Determinants of Health   Financial Resource Strain: Low Risk  (03/16/2018)   Overall Financial Resource Strain (CARDIA)    Difficulty of Paying Living Expenses: Not hard at all  Food Insecurity: No Food Insecurity (03/16/2018)   Hunger Vital Sign    Worried About Running Out of Food in the Last Year: Never true    Ran Out of Food in the Last Year: Never true  Transportation Needs: Unknown (03/16/2018)   PRAPARE - Administrator, Civil Service (Medical): No    Lack of Transportation (Non-Medical): Not on file  Physical Activity: Not on file  Stress: Stress Concern Present (03/16/2018)   [employer] of Occupational Health - Occupational Stress Questionnaire    Feeling of Stress :  Rather much  Social Connections: Not on file  Intimate Partner Violence: Not At Risk (03/16/2018)   Humiliation, Afraid, Rape, and Kick questionnaire    Fear of Current or Ex-Partner: No    Emotionally Abused: No    Physically Abused: No    Sexually Abused: No    Outpatient Medications Prior to Visit  Medication Sig Dispense Refill   blood glucose meter kit and supplies Dispense based on patient and insurance preference. Use up to four times daily as directed. (FOR ICD-10 E10.9, E11.9). 1 each 0   Cholecalciferol (VITAMIN D3) 25 MCG (1000 UT) CAPS Take 1 capsule (1,000 Units total) by mouth daily. 60 capsule 3   Insulin Pen Needle (BD PEN NEEDLE NANO U/F) 32G X 4 MM MISC Use with Ozempic 100 each 1   Lancets (ONETOUCH DELICA PLUS LANCET33G) MISC USE AS DIRECTED  FOUR TIMES A DAY 100 each 0   Omega 3 1000 MG CAPS Take 1 daily 60 capsule    omeprazole (PRILOSEC) 40 MG capsule Take 1 capsule (40 mg total) by mouth daily. 30 capsule 2   OZEMPIC, 2 MG/DOSE, 8 MG/3ML SOPN INJECT 2 MG SUBCUTANEOUSLY ONCE A WEEK FOR 28 DAYS 3 mL 0   metFORMIN (GLUCOPHAGE) 1000 MG tablet TAKE 1 TABLET (1,000 MG TOTAL) BY MOUTH TWICE A DAY WITH FOOD 180 tablet 2   nitrofurantoin, macrocrystal-monohydrate, (MACROBID) 100 MG capsule Take 1 capsule (100 mg total) by mouth 2 (two) times daily. 10 capsule 0   ONETOUCH VERIO test strip USE AS DIRECTED TO TEST BLOOD SUGARS FOUR TIMES A DAY 100 strip 3   No facility-administered medications prior to visit.    No Known Allergies  ROS Review of Systems  Constitutional:  Negative for chills and fever.  Cardiovascular:  Negative for chest pain and palpitations.  Gastrointestinal:  Negative for diarrhea, nausea and vomiting.  Endocrine: Negative for polydipsia, polyphagia and polyuria.  Skin:  Negative for rash and wound.      Objective:    Physical Exam HENT:     Head: Normocephalic.     Mouth/Throat:     Mouth: Mucous membranes are moist.  Cardiovascular:     Rate and Rhythm: Normal rate.     Heart sounds: Normal heart sounds.  Pulmonary:     Effort: Pulmonary effort is normal.     Breath sounds: Normal breath sounds.  Neurological:     Mental Status: She is alert.     BP 105/72   Pulse 80   Ht 5\' 6"  (1.676 m)   Wt 186 lb 1.3 oz (84.4 kg)   SpO2 97%   BMI 30.03 kg/m  Wt Readings from Last 3 Encounters:  07/04/22 186 lb 1.3 oz (84.4 kg)  05/21/22 186 lb 3.2 oz (84.5 kg)  03/14/22 182 lb (82.6 kg)    Lab Results  Component Value Date   TSH 1.250 05/21/2022   Lab Results  Component Value Date   WBC 10.1 05/21/2022   HGB 13.4 05/21/2022   HCT 40.7 05/21/2022   MCV 87 05/21/2022   PLT 244 05/21/2022   Lab Results  Component Value Date   NA 134 05/21/2022   K 4.8 05/21/2022   CO2 22 05/21/2022    GLUCOSE 160 (H) 05/21/2022   BUN 16 05/21/2022   CREATININE 0.78 05/21/2022   BILITOT 0.4 05/21/2022   ALKPHOS 84 05/21/2022   AST 18 05/21/2022   ALT 35 (H) 05/21/2022   PROT 7.2 05/21/2022   ALBUMIN 4.0  05/21/2022   CALCIUM 9.0 05/21/2022   ANIONGAP 8 03/22/2018   EGFR 102 05/21/2022   Lab Results  Component Value Date   CHOL 166 05/21/2022   Lab Results  Component Value Date   HDL 36 (L) 05/21/2022   Lab Results  Component Value Date   LDLCALC 107 (H) 05/21/2022   Lab Results  Component Value Date   TRIG 126 05/21/2022   Lab Results  Component Value Date   CHOLHDL 4.6 (H) 05/21/2022   Lab Results  Component Value Date   HGBA1C 7.5 (H) 05/21/2022      Assessment & Plan:  Uncontrolled diabetes mellitus with hyperglycemia, without long-term current use of insulin (HCC) Assessment & Plan: She reports not taking metformin 1000 mg twice daily due to side effects of diarrhea She has only been taking Ozempic 2 mg weekly Reports elevated blood sugars, the highest is 300 postprandial and the lowest is 84 Denies polyuria, polyphagia, polydipsia Will start the patient on glimepiride 2 mg daily Educated the patient on glimepiride increases insulin production thereby decreasing blood glucose Encouraged to continue monitoring her blood pressure 3 times daily   Type 2 diabetes mellitus without complication, without long-term current use of insulin (HCC) -     HM Diabetes Foot Exam -     Glimepiride; Take 1 tablet (2 mg total) by mouth daily before breakfast.  Dispense: 30 tablet; Refill: 3 -     OneTouch Verio; USE AS DIRECTED TO TEST BLOOD SUGARS FOUR TIMES A DAY  Dispense: 100 strip; Refill: 3  Gastroesophageal reflux disease without esophagitis Assessment & Plan: Stable on omeprazole 40 mg daily Encouraged GERD diet Advised to avoid caffeinated products and avoid lying down immediately after having meals   Vitamin D deficiency Assessment & Plan: Encouraged to  continue taking her weekly vitamin D supplement   Note: This chart has been completed using Engineer, civil (consulting) software, and while attempts have been made to ensure accuracy, certain words and phrases may not be transcribed as intended.    Follow-up: Return in about 3 months (around 10/04/2022).   Gilmore Laroche, FNP

## 2022-07-04 NOTE — Patient Instructions (Addendum)
I appreciate the opportunity to provide care to you today!    Follow up:  3 months  Labs: Next visit  Please start taking glimepiride 2 mg daily along with Ozempic 2 mg weekly  Glimepiride increases insulin production thereby decreasing your blood glucose   Please start taking over-the-counter B12 daily    Please continue to a heart-healthy diet and increase your physical activities. Try to exercise for at least five days a week.      It was a pleasure to see you and I look forward to continuing to work together on your health and well-being. Please do not hesitate to call the office if you need care or have questions about your care.   Have a wonderful day and week. With Gratitude, Gilmore Laroche MSN, FNP-BC

## 2022-07-04 NOTE — Assessment & Plan Note (Signed)
Stable on omeprazole 40 mg daily Encouraged GERD diet Advised to avoid caffeinated products and avoid lying down immediately after having meals

## 2022-07-19 ENCOUNTER — Other Ambulatory Visit: Payer: Self-pay | Admitting: Family Medicine

## 2022-07-19 DIAGNOSIS — E1165 Type 2 diabetes mellitus with hyperglycemia: Secondary | ICD-10-CM

## 2022-08-06 ENCOUNTER — Ambulatory Visit: Payer: 59 | Admitting: Family Medicine

## 2022-08-15 ENCOUNTER — Other Ambulatory Visit: Payer: Self-pay | Admitting: Family Medicine

## 2022-08-15 DIAGNOSIS — E1165 Type 2 diabetes mellitus with hyperglycemia: Secondary | ICD-10-CM

## 2022-09-15 ENCOUNTER — Other Ambulatory Visit: Payer: Self-pay | Admitting: Family Medicine

## 2022-09-15 DIAGNOSIS — E1165 Type 2 diabetes mellitus with hyperglycemia: Secondary | ICD-10-CM

## 2022-10-03 ENCOUNTER — Encounter: Payer: Self-pay | Admitting: Family Medicine

## 2022-10-03 ENCOUNTER — Ambulatory Visit (INDEPENDENT_AMBULATORY_CARE_PROVIDER_SITE_OTHER): Payer: 59 | Admitting: Family Medicine

## 2022-10-03 VITALS — BP 133/89 | HR 92 | Ht 66.0 in | Wt 181.1 lb

## 2022-10-03 DIAGNOSIS — E038 Other specified hypothyroidism: Secondary | ICD-10-CM

## 2022-10-03 DIAGNOSIS — K219 Gastro-esophageal reflux disease without esophagitis: Secondary | ICD-10-CM

## 2022-10-03 DIAGNOSIS — E559 Vitamin D deficiency, unspecified: Secondary | ICD-10-CM | POA: Diagnosis not present

## 2022-10-03 DIAGNOSIS — E1165 Type 2 diabetes mellitus with hyperglycemia: Secondary | ICD-10-CM

## 2022-10-03 DIAGNOSIS — E7849 Other hyperlipidemia: Secondary | ICD-10-CM

## 2022-10-03 DIAGNOSIS — Z7984 Long term (current) use of oral hypoglycemic drugs: Secondary | ICD-10-CM

## 2022-10-03 DIAGNOSIS — R7301 Impaired fasting glucose: Secondary | ICD-10-CM

## 2022-10-03 NOTE — Assessment & Plan Note (Signed)
Reports symptoms have resolve by implementing dietary changes and not eating at nighttime Has not been taking omeprazol 40 mg dail For managing GERD, I recommend the following lifestyle changes:  Avoid Certain Foods and Drinks: Limit or eliminate coffee, chocolate, onions, peppermint, spicy foods, carbonated beverages, citrus fruits, tomatoes, garlic, alcohol, and fatty foods such as bacon, burgers, sausages, steak, fried foods, and dairy products.  Recommended Foods: Increase your intake of high-fiber foods including whole grain cereals, oatmeal, brown rice, root vegetables, and non-citrus fruits. Opt for high-protein foods and healthy fats such as avocados, olive oil, nuts, and seeds.

## 2022-10-03 NOTE — Progress Notes (Signed)
Established Patient Office Visit  Subjective:  Patient ID: Jessica Carey, female    DOB: Nov 15, 1985  Age: 37 y.o. MRN: [id number]  CC:  Chief Complaint  Patient presents with   Care Management    3 month f/u    HPI Jessica Carey is a 37 y.o. female with past medical history of type 2 diabetes, vitamin D deficiency, and GERD presents for f/u of  chronic medical conditions. For the details of today's visit, please refer to the assessment and plan.     Past Medical History:  Diagnosis Date   ASCUS of cervix with negative high risk HPV 12/24/2020   12/24/20 repeat pap in 3 years per [facility],5 year risk CIN3+ is 0.40%   Boil, breast 07/04/2013   Diabetes (HCC) 08/04/2013   Diabetes mellitus without complication (HCC)    glyburide and insulin   Dysrhythmia    a-fib   Hyperlipidemia    Irregular periods     Past Surgical History:  Procedure Laterality Date   BREAST SURGERY Right    breast abcess   ELECTROPHYSIOLOGY STUDY N/A 03/22/2018   Procedure: ELECTROPHYSIOLOGY STUDY;  Surgeon: Marinus Maw, MD;  Location: [facility];  Service: Cardiovascular;  Laterality: N/A;   PERINEAL LACERATION REPAIR N/A 03/17/2018   Procedure: SUTURE REPAIR PERINEAL LACERATION;  Surgeon: Tereso Newcomer, MD;  Location: [facility];  Service: Gynecology;  Laterality: N/A;   SURGERY OF LIP     SVT ABLATION N/A 03/22/2018   Procedure: SVT ABLATION;  Surgeon: Marinus Maw, MD;  Location: [facility];  Service: Cardiovascular;  Laterality: N/A;    Family History  Problem Relation Age of Onset   Diabetes Paternal Grandmother    Diabetes Father    Hyperlipidemia Father    Diabetes Mother    Diabetes Sister    Cancer Maternal Aunt        liver    Social History   Socioeconomic History   Marital status: Single    Spouse name: Not on file   Number of children: 1   Years of education: Not on file   Highest education level: Not on file  Occupational History   Not on  file  Tobacco Use   Smoking status: Never   Smokeless tobacco: Never  Vaping Use   Vaping status: Never Used  Substance and Sexual Activity   Alcohol use: Not Currently    Comment: occ.   Drug use: No   Sexual activity: Yes    Partners: Male    Birth control/protection: Other-see comments    Comment: Vasectomy  Other Topics Concern   Not on file  Social History Narrative   Not on file   Social Determinants of Health   Financial Resource Strain: Low Risk  (03/16/2018)   Overall Financial Resource Strain (CARDIA)    Difficulty of Paying Living Expenses: Not hard at all  Food Insecurity: No Food Insecurity (03/16/2018)   Hunger Vital Sign    Worried About Running Out of Food in the Last Year: Never true    Ran Out of Food in the Last Year: Never true  Transportation Needs: Unknown (03/16/2018)   PRAPARE - Administrator, Civil Service (Medical): No    Lack of Transportation (Non-Medical): Not on file  Physical Activity: Not on file  Stress: Stress Concern Present (03/16/2018)   [employer] of Occupational Health - Occupational Stress Questionnaire    Feeling of Stress : Rather much  Social Connections: Not on file  Intimate Partner Violence: Not At Risk (03/16/2018)   Humiliation, Afraid, Rape, and Kick questionnaire    Fear of Current or Ex-Partner: No    Emotionally Abused: No    Physically Abused: No    Sexually Abused: No    Outpatient Medications Prior to Visit  Medication Sig Dispense Refill   blood glucose meter kit and supplies Dispense based on patient and insurance preference. Use up to four times daily as directed. (FOR ICD-10 E10.9, E11.9). 1 each 0   Cholecalciferol (VITAMIN D3) 25 MCG (1000 UT) CAPS Take 1 capsule (1,000 Units total) by mouth daily. 60 capsule 3   glimepiride (AMARYL) 2 MG tablet Take 1 tablet (2 mg total) by mouth daily before breakfast. 30 tablet 3   glucose blood (ONETOUCH VERIO) test strip USE AS DIRECTED TO TEST BLOOD  SUGARS FOUR TIMES A DAY 100 strip 3   Insulin Pen Needle (BD PEN NEEDLE NANO U/F) 32G X 4 MM MISC Use with Ozempic 100 each 1   Lancets (ONETOUCH DELICA PLUS LANCET33G) MISC USE AS DIRECTED FOUR TIMES A DAY 100 each 0   Omega 3 1000 MG CAPS Take 1 daily 60 capsule    omeprazole (PRILOSEC) 40 MG capsule Take 1 capsule (40 mg total) by mouth daily. 30 capsule 2   OZEMPIC, 2 MG/DOSE, 8 MG/3ML SOPN INJECT 2 MG SUBCUTANEOUSLY ONCE A WEEK 3 mL 0   No facility-administered medications prior to visit.    No Known Allergies  ROS Review of Systems  Constitutional:  Negative for chills and fever.  Eyes:  Negative for visual disturbance.  Respiratory:  Negative for chest tightness and shortness of breath.   Neurological:  Negative for dizziness and headaches.      Objective:    Physical Exam HENT:     Head: Normocephalic.     Mouth/Throat:     Mouth: Mucous membranes are moist.  Cardiovascular:     Rate and Rhythm: Normal rate.     Heart sounds: Normal heart sounds.  Pulmonary:     Effort: Pulmonary effort is normal.     Breath sounds: Normal breath sounds.  Neurological:     Mental Status: She is alert.     BP 133/89   Pulse 92   Ht 5\' 6"  (1.676 m)   Wt 181 lb 1.9 oz (82.2 kg)   SpO2 97%   BMI 29.23 kg/m  Wt Readings from Last 3 Encounters:  10/03/22 181 lb 1.9 oz (82.2 kg)  07/04/22 186 lb 1.3 oz (84.4 kg)  05/21/22 186 lb 3.2 oz (84.5 kg)    Lab Results  Component Value Date   TSH 1.250 05/21/2022   Lab Results  Component Value Date   WBC 10.1 05/21/2022   HGB 13.4 05/21/2022   HCT 40.7 05/21/2022   MCV 87 05/21/2022   PLT 244 05/21/2022   Lab Results  Component Value Date   NA 134 05/21/2022   K 4.8 05/21/2022   CO2 22 05/21/2022   GLUCOSE 160 (H) 05/21/2022   BUN 16 05/21/2022   CREATININE 0.78 05/21/2022   BILITOT 0.4 05/21/2022   ALKPHOS 84 05/21/2022   AST 18 05/21/2022   ALT 35 (H) 05/21/2022   PROT 7.2 05/21/2022   ALBUMIN 4.0 05/21/2022    CALCIUM 9.0 05/21/2022   ANIONGAP 8 03/22/2018   EGFR 102 05/21/2022   Lab Results  Component Value Date   CHOL 166 05/21/2022   Lab Results  Component Value Date   HDL 36 (L) 05/21/2022   Lab Results  Component Value Date   LDLCALC 107 (H) 05/21/2022   Lab Results  Component Value Date   TRIG 126 05/21/2022   Lab Results  Component Value Date   CHOLHDL 4.6 (H) 05/21/2022   Lab Results  Component Value Date   HGBA1C 7.5 (H) 05/21/2022      Assessment & Plan:  Uncontrolled diabetes mellitus with hyperglycemia, without long-term current use of insulin (HCC) Assessment & Plan: The patient denies experiencing polyuria, polyphagia, or polydipsia. She is currently taking Ozempic 2 mg weekly without reporting any gastrointestinal adverse effects. I encourage her to decrease her intake of high-sugar foods and beverages and to increase her physical activity. Lab Results  Component Value Date   HGBA1C 7.5 (H) 05/21/2022       Gastroesophageal reflux disease without esophagitis Assessment & Plan: Reports symptoms have resolve by implementing dietary changes and not eating at nighttime Has not been taking omeprazol 40 mg dail For managing GERD, I recommend the following lifestyle changes:  Avoid Certain Foods and Drinks: Limit or eliminate coffee, chocolate, onions, peppermint, spicy foods, carbonated beverages, citrus fruits, tomatoes, garlic, alcohol, and fatty foods such as bacon, burgers, sausages, steak, fried foods, and dairy products.  Recommended Foods: Increase your intake of high-fiber foods including whole grain cereals, oatmeal, brown rice, root vegetables, and non-citrus fruits. Opt for high-protein foods and healthy fats such as avocados, olive oil, nuts, and seeds.    IFG (impaired fasting glucose) -     Microalbumin / creatinine urine ratio -     Hemoglobin A1c  Vitamin D deficiency -     VITAMIN D 25 Hydroxy (Vit-D Deficiency, Fractures)  Other  specified hypothyroidism -     TSH + free T4  Other hyperlipidemia -     Lipid panel -     CMP14+EGFR -     CBC with Differential/Platelet  Note: This chart has been completed using Engineer, civil (consulting) software, and while attempts have been made to ensure accuracy, certain words and phrases may not be transcribed as intended.    Follow-up: Return in about 4 months (around 02/02/2023).   Gilmore Laroche, FNP

## 2022-10-03 NOTE — Patient Instructions (Signed)
I appreciate the opportunity to provide care to you today!    Follow up:  4 months  Labs: please stop by the lab today to get your blood drawn (CBC, CMP, TSH, Lipid profile, HgA1c, Vit D)   Attached with your AVS, you will find valuable resources for self-education. I highly recommend dedicating some time to thoroughly examine them.   Please continue to a heart-healthy diet and increase your physical activities. Try to exercise for 30mins at least five days a week.    It was a pleasure to see you and I look forward to continuing to work together on your health and well-being. Please do not hesitate to call the office if you need care or have questions about your care.  In case of emergency, please visit the Emergency Department for urgent care, or contact our clinic at [phone] to schedule an appointment. We're here to help you!   Have a wonderful day and week. With Gratitude, Gloria Zarwolo MSN, FNP-BC  

## 2022-10-03 NOTE — Assessment & Plan Note (Addendum)
The patient denies experiencing polyuria, polyphagia, or polydipsia. She is currently taking Ozempic 2 mg weekly without reporting any gastrointestinal adverse effects. I encourage her to decrease her intake of high-sugar foods and beverages and to increase her physical activity. Lab Results  Component Value Date   HGBA1C 7.5 (H) 05/21/2022

## 2022-10-06 ENCOUNTER — Other Ambulatory Visit: Payer: Self-pay | Admitting: Family Medicine

## 2022-10-06 DIAGNOSIS — E785 Hyperlipidemia, unspecified: Secondary | ICD-10-CM

## 2022-10-06 DIAGNOSIS — E119 Type 2 diabetes mellitus without complications: Secondary | ICD-10-CM

## 2022-10-06 LAB — CMP14+EGFR
ALT: 27 IU/L (ref 0–32)
AST: 24 IU/L (ref 0–40)
Albumin: 4.2 g/dL (ref 3.9–4.9)
Alkaline Phosphatase: 75 IU/L (ref 44–121)
BUN/Creatinine Ratio: 19 (ref 9–23)
BUN: 15 mg/dL (ref 6–20)
Bilirubin Total: 0.5 mg/dL (ref 0.0–1.2)
CO2: 22 mmol/L (ref 20–29)
Calcium: 9.4 mg/dL (ref 8.7–10.2)
Chloride: 102 mmol/L (ref 96–106)
Creatinine, Ser: 0.8 mg/dL (ref 0.57–1.00)
Globulin, Total: 3.3 g/dL (ref 1.5–4.5)
Glucose: 107 mg/dL — ABNORMAL HIGH (ref 70–99)
Potassium: 4.3 mmol/L (ref 3.5–5.2)
Sodium: 138 mmol/L (ref 134–144)
Total Protein: 7.5 g/dL (ref 6.0–8.5)
eGFR: 98 mL/min/{1.73_m2} (ref >59)

## 2022-10-06 LAB — MICROALBUMIN / CREATININE URINE RATIO
Creatinine, Urine: 197.2 mg/dL
Microalb/Creat Ratio: 16 mg/g{creat} (ref 0–29)
Microalbumin, Urine: 31.5 ug/mL

## 2022-10-06 LAB — LIPID PANEL
Chol/HDL Ratio: 4.7 ratio — ABNORMAL HIGH (ref 0.0–4.4)
Cholesterol, Total: 163 mg/dL (ref 100–199)
HDL: 35 mg/dL — ABNORMAL LOW (ref >39)
LDL Chol Calc (NIH): 106 mg/dL — ABNORMAL HIGH (ref 0–99)
Triglycerides: 118 mg/dL (ref 0–149)
VLDL Cholesterol Cal: 22 mg/dL (ref 5–40)

## 2022-10-06 LAB — CBC WITH DIFFERENTIAL/PLATELET
Basophils Absolute: 0.1 10*3/uL (ref 0.0–0.2)
Basos: 1 %
EOS (ABSOLUTE): 0.2 10*3/uL (ref 0.0–0.4)
Eos: 2 %
Hematocrit: 40.8 % (ref 34.0–46.6)
Hemoglobin: 13.2 g/dL (ref 11.1–15.9)
Immature Grans (Abs): 0 10*3/uL (ref 0.0–0.1)
Immature Granulocytes: 0 %
Lymphocytes Absolute: 4.1 10*3/uL — ABNORMAL HIGH (ref 0.7–3.1)
Lymphs: 43 %
MCH: 27.6 pg (ref 26.6–33.0)
MCHC: 32.4 g/dL (ref 31.5–35.7)
MCV: 85 fL (ref 79–97)
Monocytes Absolute: 0.5 10*3/uL (ref 0.1–0.9)
Monocytes: 5 %
Neutrophils Absolute: 4.7 10*3/uL (ref 1.4–7.0)
Neutrophils: 49 %
Platelets: 268 10*3/uL (ref 150–450)
RBC: 4.79 x10E6/uL (ref 3.77–5.28)
RDW: 13.3 % (ref 11.7–15.4)
WBC: 9.5 10*3/uL (ref 3.4–10.8)

## 2022-10-06 LAB — HEMOGLOBIN A1C
Est. average glucose Bld gHb Est-mCnc: 177 mg/dL
Hgb A1c MFr Bld: 7.8 % — ABNORMAL HIGH (ref 4.8–5.6)

## 2022-10-06 LAB — VITAMIN D 25 HYDROXY (VIT D DEFICIENCY, FRACTURES): Vit D, 25-Hydroxy: 70.8 ng/mL (ref 30.0–100.0)

## 2022-10-06 LAB — TSH+FREE T4
Free T4: 1.07 ng/dL (ref 0.82–1.77)
TSH: 1.7 u[IU]/mL (ref 0.450–4.500)

## 2022-10-06 MED ORDER — ROSUVASTATIN CALCIUM 10 MG PO TABS
10.0000 mg | ORAL_TABLET | Freq: Every day | ORAL | 1 refills | Status: DC
Start: 2022-10-06 — End: 2022-11-26

## 2022-10-06 MED ORDER — GLIMEPIRIDE 2 MG PO TABS
2.0000 mg | ORAL_TABLET | Freq: Every day | ORAL | 1 refills | Status: DC
Start: 2022-10-06 — End: 2022-10-20

## 2022-10-06 NOTE — Progress Notes (Unsigned)
The ASCVD Risk score (Arnett DK, et al., 2019) failed to calculate for the following reasons:    The 2019 ASCVD risk score is only valid for ages 40 to 79

## 2022-10-14 ENCOUNTER — Other Ambulatory Visit: Payer: Self-pay | Admitting: Family Medicine

## 2022-10-14 DIAGNOSIS — E119 Type 2 diabetes mellitus without complications: Secondary | ICD-10-CM

## 2022-10-16 ENCOUNTER — Other Ambulatory Visit: Payer: Self-pay | Admitting: Family Medicine

## 2022-10-16 DIAGNOSIS — E1165 Type 2 diabetes mellitus with hyperglycemia: Secondary | ICD-10-CM

## 2022-10-19 ENCOUNTER — Other Ambulatory Visit: Payer: Self-pay | Admitting: Family Medicine

## 2022-10-19 DIAGNOSIS — E119 Type 2 diabetes mellitus without complications: Secondary | ICD-10-CM

## 2022-11-17 ENCOUNTER — Other Ambulatory Visit: Payer: Self-pay | Admitting: Family Medicine

## 2022-11-17 DIAGNOSIS — E1165 Type 2 diabetes mellitus with hyperglycemia: Secondary | ICD-10-CM

## 2022-11-17 DIAGNOSIS — E119 Type 2 diabetes mellitus without complications: Secondary | ICD-10-CM

## 2022-11-26 ENCOUNTER — Other Ambulatory Visit (HOSPITAL_COMMUNITY)
Admission: RE | Admit: 2022-11-26 | Discharge: 2022-11-26 | Disposition: A | Discharge status: Home / Self Care | Payer: 59 | Source: Ambulatory Visit | Attending: Adult Health | Admitting: Adult Health

## 2022-11-26 ENCOUNTER — Encounter: Payer: Self-pay | Admitting: Adult Health

## 2022-11-26 ENCOUNTER — Ambulatory Visit: Payer: 59 | Admitting: Adult Health

## 2022-11-26 VITALS — BP 103/71 | HR 65 | Ht 63.0 in | Wt 187.5 lb

## 2022-11-26 DIAGNOSIS — N920 Excessive and frequent menstruation with regular cycle: Secondary | ICD-10-CM | POA: Insufficient documentation

## 2022-11-26 DIAGNOSIS — N946 Dysmenorrhea, unspecified: Secondary | ICD-10-CM | POA: Insufficient documentation

## 2022-11-26 DIAGNOSIS — Z01419 Encounter for gynecological examination (general) (routine) without abnormal findings: Secondary | ICD-10-CM | POA: Insufficient documentation

## 2022-11-26 DIAGNOSIS — Z1339 Encounter for screening examination for other mental health and behavioral disorders: Secondary | ICD-10-CM

## 2022-11-26 NOTE — Progress Notes (Signed)
Patient ID: Jessica Carey, female   DOB: 21-Aug-1985, 37 y.o.   MRN: [id number] History of Present Illness: Jessica Carey is a 37 year old female, single, G1P1001, in for a well woman gyn exam and she requests pap today. She says periods are heavier in last 6 months, lasting 6-7 days, will cramping week before period starts and may change tampon every hour. She is working third shift. Has reflux at times(has Prilosec and TUMS).  HGB was 13.2 10/03/22 and A1c was 7.8.  PCP is Gilmore Laroche NP    Current Medications, Allergies, Past Medical History, Past Surgical History, Family History and Social History were reviewed in [name].     Review of Systems: Patient denies any headaches, hearing loss, fatigue, blurred vision, shortness of breath, chest pain, abdominal pain, problems with bowel movements, urination, or intercourse(not currently active). No joint pain or mood swings.  See HPI for positives.   Physical Exam:BP 103/71 (BP Location: Left Arm, Patient Position: Sitting, Cuff Size: Normal)   Pulse 65   Ht 5\' 3"  (1.6 m)   Wt 187 lb 8 oz (85 kg)   LMP 11/16/2022   BMI 33.21 kg/m   General:  Well developed, well nourished, no acute distress Skin:  Warm and dry Neck:  Midline trachea, normal thyroid, good ROM, no lymphadenopathy Lungs; Clear to auscultation bilaterally Breast:  No dominant palpable mass, retraction, or nipple discharge Cardiovascular: Regular rate and rhythm Abdomen:  Soft, non tender, no hepatosplenomegaly Pelvic:  External genitalia is normal in appearance, no lesions.  The vagina is normal in appearance. Urethra has no lesions or masses. The cervix is bulbous,everted at os, pap with GC/CHL and HR HPV genotyping performed, friable with EC brush.  Uterus is felt to be normal size, shape, and contour.  No adnexal masses or tenderness noted.Bladder is non tender, no masses felt. Extremities/musculoskeletal:  No swelling or varicosities noted, no  clubbing or cyanosis Psych:  No mood changes, alert and cooperative,seems happy AA is 0    11/26/2022    9:02 AM 10/03/2022    8:31 AM 07/04/2022    8:55 AM  Depression screen PHQ 2/9  Decreased Interest 0 0 0  Down, Depressed, Hopeless 0 0 0  PHQ - 2 Score 0 0 0  Altered sleeping 2 0 0  Tired, decreased energy 0 0 0  Change in appetite 0 0 0  Feeling bad or failure about yourself  0 0 0  Trouble concentrating 0 0 0  Moving slowly or fidgety/restless 0 0 0  Suicidal thoughts 0 0 0  PHQ-9 Score 2 0 0  Difficult doing work/chores  Not difficult at all Not difficult at all       11/26/2022    9:03 AM 10/03/2022    8:31 AM 07/04/2022    8:55 AM 12/12/2021    8:54 AM  GAD 7 : Generalized Anxiety Score  Nervous, Anxious, on Edge 0 0 0 0  Control/stop worrying 0 0 0 0  Worry too much - different things 0 0 0 0  Trouble relaxing 0 0 0 0  Restless 0 0 0 0  Easily annoyed or irritable 1 0 0 0  Afraid - awful might happen 0 0 0 0  Total GAD 7 Score 1 0 0 0  Anxiety Difficulty  Not difficult at all Not difficult at all Not difficult at all    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 Encounter for  gynecological examination with Papanicolaou smear of cervix Pap sent Pap in 3 years if normal Labs with PCP  - Cytology - PAP( [facility])  2. Menorrhagia with regular cycle Periods lasting 6-7 days May change tampon every hour  3. Menstrual cramps Has cramps before starts

## 2022-12-01 LAB — CYTOLOGY - PAP
Chlamydia: NEGATIVE
Comment: NEGATIVE
Comment: NEGATIVE
Comment: NORMAL
Diagnosis: NEGATIVE
High risk HPV: NEGATIVE
Neisseria Gonorrhea: NEGATIVE

## 2022-12-15 ENCOUNTER — Other Ambulatory Visit: Payer: Self-pay | Admitting: Family Medicine

## 2022-12-15 DIAGNOSIS — E1165 Type 2 diabetes mellitus with hyperglycemia: Secondary | ICD-10-CM

## 2022-12-16 ENCOUNTER — Other Ambulatory Visit: Payer: Self-pay | Admitting: Family Medicine

## 2022-12-16 DIAGNOSIS — E119 Type 2 diabetes mellitus without complications: Secondary | ICD-10-CM

## 2023-01-06 ENCOUNTER — Ambulatory Visit: Payer: 59 | Attending: Internal Medicine | Admitting: Internal Medicine

## 2023-01-06 ENCOUNTER — Encounter: Payer: Self-pay | Admitting: Internal Medicine

## 2023-01-06 VITALS — BP 116/70 | HR 71 | Ht 63.0 in | Wt 186.4 lb

## 2023-01-06 DIAGNOSIS — R002 Palpitations: Secondary | ICD-10-CM

## 2023-01-06 NOTE — Patient Instructions (Addendum)
 Medication Instructions:  Your physician recommends that you continue on your current medications as directed. Please refer to the Current Medication list given to you today.  *If you need a refill on your cardiac medications before your next appointment, please call your pharmacy*  Lab Work: None ordered.  If you have labs (blood work) drawn today and your tests are completely normal, you will receive your results only by: MyChart Message (if you have MyChart) OR A paper copy in the mail If you have any lab test that is abnormal or we need to change your treatment, we will call you to review the results.  Testing/Procedures: None ordered.  Follow-Up: At [facility], you and your health needs are our priority.  As part of our continuing mission to provide you with exceptional heart care, we have created designated Provider Care Teams.  These Care Teams include your primary Cardiologist (physician) and Advanced Practice Providers (APPs -  Physician Assistants and Nurse Practitioners) who all work together to provide you with the care you need, when you need it.   Your next appointment:   As needed.  The format for your next appointment:   In Person  Provider:   Lewayne Bunting, MD{or one of the following Advanced Practice Providers on your designated Care Team:   Francis Dowse, [place] Casimiro Needle "Mardelle Matte" [place], [place] Earnest Rosier, NP   Important Information About Sugar

## 2023-01-06 NOTE — Progress Notes (Signed)
HPI Ms. Amedee returns today for followup. She is a pleasant 37 yo woman with SVT who underwent catheter ablation a couple of years ago. She had recurrent palpitations which appeared to be due to brief paroxysms of atrial tachcardia. She had an improvement in these symptoms but when she is tired they will recur. She denies chest pain or sob. She has had some musculoskeletal pain. She is asymptomatic from her PAC's. She has gained weight.     No Known Allergies   Current Outpatient Medications  Medication Sig Dispense Refill   blood glucose meter kit and supplies Dispense based on patient and insurance preference. Use up to four times daily as directed. (FOR ICD-10 E10.9, E11.9). 1 each 0   Cholecalciferol (VITAMIN D3) 25 MCG (1000 UT) CAPS Take 1 capsule (1,000 Units total) by mouth daily. 60 capsule 3   glimepiride (AMARYL) 2 MG tablet TAKE 1 TABLET BY MOUTH ONCE DAILY BEFORE BREAKFAST 30 tablet 0   glucose blood (ONETOUCH VERIO) test strip USE 1 STRIP TO CHECK GLUCOSE 4 TIMES DAILY AS DIRECTED 100 each 0   Insulin Pen Needle (BD PEN NEEDLE NANO U/F) 32G X 4 MM MISC Use with Ozempic 100 each 1   Lancets (ONETOUCH DELICA PLUS LANCET33G) MISC USE AS DIRECTED FOUR TIMES A DAY 100 each 0   Omega 3 1000 MG CAPS Take 1 daily 60 capsule    OZEMPIC, 2 MG/DOSE, 8 MG/3ML SOPN INJECT 2 MG SUBCUTANEOUSLY ONCE A WEEK 3 mL 0   No current facility-administered medications for this visit.     Past Medical History:  Diagnosis Date   ASCUS of cervix with negative high risk HPV 12/24/2020   12/24/20 repeat pap in 3 years per [facility],5 year risk CIN3+ is 0.40%   Boil, breast 07/04/2013   Diabetes (HCC) 08/04/2013   Diabetes mellitus without complication (HCC)    glyburide and insulin   Dysrhythmia    a-fib   Hyperlipidemia    Irregular periods     ROS:   All systems reviewed and negative except as noted in the HPI.   Past Surgical History:  Procedure Laterality Date   BREAST SURGERY  Right    breast abcess   ELECTROPHYSIOLOGY STUDY N/A 03/22/2018   Procedure: ELECTROPHYSIOLOGY STUDY;  Surgeon: Marinus Maw, MD;  Location: [facility];  Service: Cardiovascular;  Laterality: N/A;   PERINEAL LACERATION REPAIR N/A 03/17/2018   Procedure: SUTURE REPAIR PERINEAL LACERATION;  Surgeon: Tereso Newcomer, MD;  Location: [facility];  Service: Gynecology;  Laterality: N/A;   SURGERY OF LIP     SVT ABLATION N/A 03/22/2018   Procedure: SVT ABLATION;  Surgeon: Marinus Maw, MD;  Location: [facility];  Service: Cardiovascular;  Laterality: N/A;     Family History  Problem Relation Age of Onset   Diabetes Paternal Grandmother    Diabetes Father    Hyperlipidemia Father    Diabetes Mother    Diabetes Sister    Cancer Maternal Aunt        liver     Social History   Socioeconomic History   Marital status: Single    Spouse name: Not on file   Number of children: 1   Years of education: Not on file   Highest education level: Not on file  Occupational History   Not on file  Tobacco Use   Smoking status: Never   Smokeless tobacco: Never  Vaping Use  Vaping status: Never Used  Substance and Sexual Activity   Alcohol use: Not Currently    Comment: occ.   Drug use: No   Sexual activity: Not Currently    Partners: Male    Birth control/protection: None  Other Topics Concern   Not on file  Social History Narrative   Not on file   Social Determinants of Health   Financial Resource Strain: Low Risk  (11/26/2022)   Overall Financial Resource Strain (CARDIA)    Difficulty of Paying Living Expenses: Not hard at all  Food Insecurity: No Food Insecurity (11/26/2022)   Hunger Vital Sign    Worried About Running Out of Food in the Last Year: Never true    Ran Out of Food in the Last Year: Never true  Transportation Needs: No Transportation Needs (11/26/2022)   PRAPARE - Administrator, Civil Service (Medical): No    Lack of  Transportation (Non-Medical): No  Physical Activity: Insufficiently Active (11/26/2022)   Exercise Vital Sign    Days of Exercise per Week: 1 day    Minutes of Exercise per Session: 20 min  Stress: No Stress Concern Present (11/26/2022)   [employer] of Occupational Health - Occupational Stress Questionnaire    Feeling of Stress : Not at all  Social Connections: Socially Isolated (11/26/2022)   Social Connection and Isolation Panel [NHANES]    Frequency of Communication with Friends and Family: Once a week    Frequency of Social Gatherings with Friends and Family: Once a week    Attends Religious Services: More than 4 times per year    Active Member of [employer] or Organizations: No    Attends Banker Meetings: Never    Marital Status: Divorced  Catering manager Violence: Not At Risk (11/26/2022)   Humiliation, Afraid, Rape, and Kick questionnaire    Fear of Current or Ex-Partner: No    Emotionally Abused: No    Physically Abused: No    Sexually Abused: No     BP 116/70   Pulse 71   Ht 5\' 3"  (1.6 m)   Wt 186 lb 6.4 oz (84.6 kg)   SpO2 98%   BMI 33.02 kg/m   Physical Exam:  Well appearing NAD HEENT: Unremarkable Neck:  No JVD, no thyromegally Lymphatics:  No adenopathy Back:  No CVA tenderness Lungs:  Clear HEART:  Regular rate rhythm, no murmurs, no rubs, no clicks Abd:  soft, positive bowel sounds, no organomegally, no rebound, no guarding Ext:  2 plus pulses, no edema, no cyanosis, no clubbing Skin:  No rashes no nodules Neuro:  CN II through XII intact, motor grossly intact  EKG - nsr  Assess/Plan:  1. Palpitations - these are infrequent. She did not feel her palpitations today. 2. Obesity - she has been placed on ozempic but has gained weight.   3. SVT - she is s/p ablation of AVNRT. She will call us if she has recurrent symptoms.     Sharlot Gowda Ayriana Wix,MD

## 2023-01-11 ENCOUNTER — Other Ambulatory Visit: Payer: Self-pay | Admitting: Family Medicine

## 2023-01-11 DIAGNOSIS — E1165 Type 2 diabetes mellitus with hyperglycemia: Secondary | ICD-10-CM

## 2023-01-14 ENCOUNTER — Other Ambulatory Visit: Payer: Self-pay | Admitting: Family Medicine

## 2023-01-14 DIAGNOSIS — E119 Type 2 diabetes mellitus without complications: Secondary | ICD-10-CM

## 2023-02-02 ENCOUNTER — Encounter: Payer: Self-pay | Admitting: Family Medicine

## 2023-02-02 ENCOUNTER — Ambulatory Visit (INDEPENDENT_AMBULATORY_CARE_PROVIDER_SITE_OTHER): Payer: 59 | Admitting: Family Medicine

## 2023-02-02 VITALS — BP 111/70 | HR 78 | Ht 63.0 in | Wt 185.1 lb

## 2023-02-02 DIAGNOSIS — E559 Vitamin D deficiency, unspecified: Secondary | ICD-10-CM

## 2023-02-02 DIAGNOSIS — E663 Overweight: Secondary | ICD-10-CM

## 2023-02-02 DIAGNOSIS — E7849 Other hyperlipidemia: Secondary | ICD-10-CM

## 2023-02-02 DIAGNOSIS — E038 Other specified hypothyroidism: Secondary | ICD-10-CM

## 2023-02-02 DIAGNOSIS — Z7984 Long term (current) use of oral hypoglycemic drugs: Secondary | ICD-10-CM

## 2023-02-02 DIAGNOSIS — E1165 Type 2 diabetes mellitus with hyperglycemia: Secondary | ICD-10-CM

## 2023-02-02 MED ORDER — GLIMEPIRIDE 2 MG PO TABS: 2.0000 mg | ORAL_TABLET | Freq: Every day | ORAL | 0 refills | Status: DC

## 2023-02-02 MED ORDER — ONETOUCH DELICA PLUS LANCET33G MISC: 0 refills | Status: AC

## 2023-02-02 MED ORDER — OZEMPIC (2 MG/DOSE) 8 MG/3ML ~~LOC~~ SOPN: PEN_INJECTOR | SUBCUTANEOUS | 0 refills | Status: DC

## 2023-02-02 MED ORDER — ONETOUCH VERIO VI STRP: ORAL_STRIP | 0 refills | Status: DC

## 2023-02-02 MED ORDER — BD PEN NEEDLE NANO U/F 32G X 4 MM MISC: 5 refills | Status: AC

## 2023-02-02 NOTE — Assessment & Plan Note (Addendum)
The patient was recommended to follow a heart-healthy diet and increase physical activity. At least 150 minutes of moderate-intensity physical activity per week is encouraged. Wt Readings from Last 3 Encounters:  02/02/23 185 lb 1.9 oz (84 kg)  01/06/23 186 lb 6.4 oz (84.6 kg)  11/26/22 187 lb 8 oz (85 kg)

## 2023-02-02 NOTE — Patient Instructions (Signed)
I appreciate the opportunity to provide care to you today!    Follow up:  4 months  Labs: please stop by the lab today to get your blood drawn (CBC, CMP, TSH, Lipid profile, HgA1c, Vit D)   For managing Diabetes, I recommend the following lifestyle changes:  Reduce Intake of High-Sugar Foods and Beverages: Limit foods and drinks high in sugar to help regulate blood sugar levels. Increase Consumption of Nutrient-Rich Foods: Focus on incorporating more fruits, vegetables, and whole grains into your diet. Choose Lean Proteins: Opt for lean proteins such as chicken, fish, beans, and legumes. Select Low-Fat Dairy Products: Choose low-fat or non-fat dairy options. Minimize Saturated Fats, Trans Fats, and Cholesterol: Reduce intake of foods high in saturated fats, trans fatty acids, and cholesterol. Engage in Regular Physical Activity: Aim for at least 30 minutes of brisk walking or other moderate activity at least 5 days a week.  Lifestyle modifications for prediabetes were discussed, including adopting a heart-healthy diet and increasing physical activity. The patient was encouraged to decrease her intake of high-sugar foods and beverages. She verbalized understanding and is aware of the plan of care.    Attached with your AVS, you will find valuable resources for self-education. I highly recommend dedicating some time to thoroughly examine them.   Please continue to a heart-healthy diet and increase your physical activities. Try to exercise for at least five days a week.    It was a pleasure to see you and I look forward to continuing to work together on your health and well-being. Please do not hesitate to call the office if you need care or have questions about your care.  In case of emergency, please visit the Emergency Department for urgent care, or contact our clinic at [phone] to schedule an appointment. We're here to help you!   Have a wonderful day and week. With  Gratitude, Gilmore Laroche MSN, FNP-BC

## 2023-02-02 NOTE — Assessment & Plan Note (Signed)
The patient reports that her fasting blood sugar is 223, with the lowest being 120. She indicates poor glycemic control while on Ozempic 2 mg weekly and glimepiride 2 mg daily. There are no reports of polyuria, polyphagia, or polydipsia. We will assess her hemoglobin A1c today and consider adjustments to her treatment regimen, including possibly discontinuing Ozempic and starting Mounjaro for better glycemic control, along with increasing the dose of glimepiride. The patient was advised to decrease her intake of high-sugar foods and beverages and increase physical activity. The patient verbalized understanding and is aware of the plan of care.

## 2023-02-02 NOTE — Assessment & Plan Note (Signed)
Encouraged to continue taking her weekly vitamin D supplement 

## 2023-02-02 NOTE — Progress Notes (Signed)
Established Patient Office Visit  Subjective:  Patient ID: Jessica Carey, female    DOB: 07-31-1985  Age: 37 y.o. MRN: [id number]  CC:  Chief Complaint  Patient presents with   Care Management    4 month f/u, pt states she is fasting today, in case labs are needed.     HPI Jessica Carey is a 37 y.o. female with past medical history of type 2 diabetes, vitamin D deficiency and obesity presents for f/u of  chronic medical conditions. For the details of today's visit, please refer to the assessment and plan.     Past Medical History:  Diagnosis Date   ASCUS of cervix with negative high risk HPV 12/24/2020   12/24/20 repeat pap in 3 years per [facility],5 year risk CIN3+ is 0.40%   Boil, breast 07/04/2013   Diabetes (HCC) 08/04/2013   Diabetes mellitus without complication (HCC)    glyburide and insulin   Dysrhythmia    a-fib   Hyperlipidemia    Irregular periods     Past Surgical History:  Procedure Laterality Date   BREAST SURGERY Right    breast abcess   ELECTROPHYSIOLOGY STUDY N/A 03/22/2018   Procedure: ELECTROPHYSIOLOGY STUDY;  Surgeon: Marinus Maw, MD;  Location: [facility];  Service: Cardiovascular;  Laterality: N/A;   PERINEAL LACERATION REPAIR N/A 03/17/2018   Procedure: SUTURE REPAIR PERINEAL LACERATION;  Surgeon: Tereso Newcomer, MD;  Location: [facility];  Service: Gynecology;  Laterality: N/A;   SURGERY OF LIP     SVT ABLATION N/A 03/22/2018   Procedure: SVT ABLATION;  Surgeon: Marinus Maw, MD;  Location: [facility];  Service: Cardiovascular;  Laterality: N/A;    Family History  Problem Relation Age of Onset   Diabetes Paternal Grandmother    Diabetes Father    Hyperlipidemia Father    Diabetes Mother    Diabetes Sister    Cancer Maternal Aunt        liver    Social History   Socioeconomic History   Marital status: Single    Spouse name: Not on file   Number of children: 1   Years of education: Not on file   Highest  education level: Not on file  Occupational History   Not on file  Tobacco Use   Smoking status: Never   Smokeless tobacco: Never  Vaping Use   Vaping status: Never Used  Substance and Sexual Activity   Alcohol use: Not Currently    Comment: occ.   Drug use: No   Sexual activity: Not Currently    Partners: Male    Birth control/protection: None  Other Topics Concern   Not on file  Social History Narrative   Not on file   Social Drivers of Health   Financial Resource Strain: Low Risk  (11/26/2022)   Overall Financial Resource Strain (CARDIA)    Difficulty of Paying Living Expenses: Not hard at all  Food Insecurity: No Food Insecurity (11/26/2022)   Hunger Vital Sign    Worried About Running Out of Food in the Last Year: Never true    Ran Out of Food in the Last Year: Never true  Transportation Needs: No Transportation Needs (11/26/2022)   PRAPARE - Administrator, Civil Service (Medical): No    Lack of Transportation (Non-Medical): No  Physical Activity: Insufficiently Active (11/26/2022)   Exercise Vital Sign    Days of Exercise per Week: 1 day    Minutes of  Exercise per Session: 20 min  Stress: No Stress Concern Present (11/26/2022)   [employer] of Occupational Health - Occupational Stress Questionnaire    Feeling of Stress : Not at all  Social Connections: Socially Isolated (11/26/2022)   Social Connection and Isolation Panel [NHANES]    Frequency of Communication with Friends and Family: Once a week    Frequency of Social Gatherings with Friends and Family: Once a week    Attends Religious Services: More than 4 times per year    Active Member of [employer] or Organizations: No    Attends Banker Meetings: Never    Marital Status: Divorced  Catering manager Violence: Not At Risk (11/26/2022)   Humiliation, Afraid, Rape, and Kick questionnaire    Fear of Current or Ex-Partner: No    Emotionally Abused: No    Physically Abused: No     Sexually Abused: No    Outpatient Medications Prior to Visit  Medication Sig Dispense Refill   blood glucose meter kit and supplies Dispense based on patient and insurance preference. Use up to four times daily as directed. (FOR ICD-10 E10.9, E11.9). 1 each 0   Cholecalciferol (VITAMIN D3) 25 MCG (1000 UT) CAPS Take 1 capsule (1,000 Units total) by mouth daily. 60 capsule 3   Omega 3 1000 MG CAPS Take 1 daily 60 capsule    glimepiride (AMARYL) 2 MG tablet TAKE 1 TABLET BY MOUTH ONCE DAILY BEFORE BREAKFAST 30 tablet 0   glucose blood (ONETOUCH VERIO) test strip USE 1 STRIP TO CHECK GLUCOSE 4 TIMES DAILY AS DIRECTED 100 each 0   Insulin Pen Needle (BD PEN NEEDLE NANO U/F) 32G X 4 MM MISC Use with Ozempic 100 each 1   Lancets (ONETOUCH DELICA PLUS LANCET33G) MISC USE AS DIRECTED FOUR TIMES A DAY 100 each 0   Semaglutide, 2 MG/DOSE, (OZEMPIC, 2 MG/DOSE,) 8 MG/3ML SOPN INJECT 2 MG INTO THE SKIN ONCE A WEEK 3 mL 0   No facility-administered medications prior to visit.    No Known Allergies  ROS Review of Systems  Constitutional:  Negative for chills and fever.  Eyes:  Negative for visual disturbance.  Respiratory:  Negative for chest tightness and shortness of breath.   Neurological:  Negative for dizziness and headaches.      Objective:    Physical Exam HENT:     Head: Normocephalic.     Mouth/Throat:     Mouth: Mucous membranes are moist.  Cardiovascular:     Rate and Rhythm: Normal rate.     Heart sounds: Normal heart sounds.  Pulmonary:     Effort: Pulmonary effort is normal.     Breath sounds: Normal breath sounds.  Neurological:     Mental Status: She is alert.     BP 111/70   Pulse 78   Ht 5\' 3"  (1.6 m)   Wt 185 lb 1.9 oz (84 kg)   SpO2 96%   BMI 32.79 kg/m  Wt Readings from Last 3 Encounters:  02/02/23 185 lb 1.9 oz (84 kg)  01/06/23 186 lb 6.4 oz (84.6 kg)  11/26/22 187 lb 8 oz (85 kg)    Lab Results  Component Value Date   TSH 1.700 10/03/2022   Lab  Results  Component Value Date   WBC 9.5 10/03/2022   HGB 13.2 10/03/2022   HCT 40.8 10/03/2022   MCV 85 10/03/2022   PLT 268 10/03/2022   Lab Results  Component Value Date   NA 138 10/03/2022  K 4.3 10/03/2022   CO2 22 10/03/2022   GLUCOSE 107 (H) 10/03/2022   BUN 15 10/03/2022   CREATININE 0.80 10/03/2022   BILITOT 0.5 10/03/2022   ALKPHOS 75 10/03/2022   AST 24 10/03/2022   ALT 27 10/03/2022   PROT 7.5 10/03/2022   ALBUMIN 4.2 10/03/2022   CALCIUM 9.4 10/03/2022   ANIONGAP 8 03/22/2018   EGFR 98 10/03/2022   Lab Results  Component Value Date   CHOL 163 10/03/2022   Lab Results  Component Value Date   HDL 35 (L) 10/03/2022   Lab Results  Component Value Date   LDLCALC 106 (H) 10/03/2022   Lab Results  Component Value Date   TRIG 118 10/03/2022   Lab Results  Component Value Date   CHOLHDL 4.7 (H) 10/03/2022   Lab Results  Component Value Date   HGBA1C 7.8 (H) 10/03/2022      Assessment & Plan:  Uncontrolled diabetes mellitus with hyperglycemia, without long-term current use of insulin (HCC) Assessment & Plan: The patient reports that her fasting blood sugar is 223, with the lowest being 120. She indicates poor glycemic control while on Ozempic 2 mg weekly and glimepiride 2 mg daily. There are no reports of polyuria, polyphagia, or polydipsia. We will assess her hemoglobin A1c today and consider adjustments to her treatment regimen, including possibly discontinuing Ozempic and starting Mounjaro for better glycemic control, along with increasing the dose of glimepiride. The patient was advised to decrease her intake of high-sugar foods and beverages and increase physical activity. The patient verbalized understanding and is aware of the plan of care.   Orders: -     Glimepiride; Take 1 tablet (2 mg total) by mouth daily before breakfast.  Dispense: 30 tablet; Refill: 0 -     OneTouch Verio; USE 1 STRIP TO CHECK GLUCOSE 4 TIMES DAILY AS DIRECTED   Dispense: 100 each; Refill: 0 -     BD Pen Needle Nano U/F; Use with Ozempic  Dispense: 100 each; Refill: 5 -     OneTouch Delica Plus Lancet33G; as directed  Dispense: 100 each; Refill: 0 -     Ozempic (2 MG/DOSE); INJECT 2 MG INTO THE SKIN ONCE A WEEK  Dispense: 3 mL; Refill: 0 -     Hemoglobin A1c  Overweight (BMI 25.0-29.9) Assessment & Plan: The patient was recommended to follow a heart-healthy diet and increase physical activity. At least 150 minutes of moderate-intensity physical activity per week is encouraged. Wt Readings from Last 3 Encounters:  02/02/23 185 lb 1.9 oz (84 kg)  01/06/23 186 lb 6.4 oz (84.6 kg)  11/26/22 187 lb 8 oz (85 kg)       Vitamin D deficiency Assessment & Plan: Encouraged to continue taking her weekly vitamin D supplement   Orders: -     VITAMIN D 25 Hydroxy (Vit-D Deficiency, Fractures)  TSH (thyroid-stimulating hormone deficiency) -     TSH + free T4  Other hyperlipidemia -     Lipid panel -     CMP14+EGFR -     CBC with Differential/Platelet  Note: This chart has been completed using Engineer, civil (consulting) software, and while attempts have been made to ensure accuracy, certain words and phrases may not be transcribed as intended.    Follow-up: Return in about 4 months (around 06/03/2023).   Gilmore Laroche, FNP

## 2023-02-03 ENCOUNTER — Other Ambulatory Visit: Payer: Self-pay | Admitting: Family Medicine

## 2023-02-03 DIAGNOSIS — E1165 Type 2 diabetes mellitus with hyperglycemia: Secondary | ICD-10-CM

## 2023-02-03 DIAGNOSIS — E1169 Type 2 diabetes mellitus with other specified complication: Secondary | ICD-10-CM

## 2023-02-03 LAB — LIPID PANEL
Chol/HDL Ratio: 5.9 {ratio} — ABNORMAL HIGH (ref 0.0–4.4)
Cholesterol, Total: 202 mg/dL — ABNORMAL HIGH (ref 100–199)
HDL: 34 mg/dL — ABNORMAL LOW (ref >39)
LDL Chol Calc (NIH): 133 mg/dL — ABNORMAL HIGH (ref 0–99)
Triglycerides: 192 mg/dL — ABNORMAL HIGH (ref 0–149)
VLDL Cholesterol Cal: 35 mg/dL (ref 5–40)

## 2023-02-03 LAB — CMP14+EGFR
ALT: 66 [IU]/L — ABNORMAL HIGH (ref 0–32)
AST: 46 [IU]/L — ABNORMAL HIGH (ref 0–40)
Albumin: 4.2 g/dL (ref 3.9–4.9)
Alkaline Phosphatase: 100 [IU]/L (ref 44–121)
BUN/Creatinine Ratio: 23 (ref 9–23)
BUN: 18 mg/dL (ref 6–20)
Bilirubin Total: <0.2 mg/dL (ref 0.0–1.2)
CO2: 23 mmol/L (ref 20–29)
Calcium: 9.2 mg/dL (ref 8.7–10.2)
Chloride: 101 mmol/L (ref 96–106)
Creatinine, Ser: 0.77 mg/dL (ref 0.57–1.00)
Globulin, Total: 3.4 g/dL (ref 1.5–4.5)
Glucose: 169 mg/dL — ABNORMAL HIGH (ref 70–99)
Potassium: 4.8 mmol/L (ref 3.5–5.2)
Sodium: 138 mmol/L (ref 134–144)
Total Protein: 7.6 g/dL (ref 6.0–8.5)
eGFR: 102 mL/min/{1.73_m2} (ref >59)

## 2023-02-03 LAB — CBC WITH DIFFERENTIAL/PLATELET
Basophils Absolute: 0.1 10*3/uL (ref 0.0–0.2)
Basos: 1 %
EOS (ABSOLUTE): 0.2 10*3/uL (ref 0.0–0.4)
Eos: 2 %
Hematocrit: 42.4 % (ref 34.0–46.6)
Hemoglobin: 13.6 g/dL (ref 11.1–15.9)
Immature Grans (Abs): 0 10*3/uL (ref 0.0–0.1)
Immature Granulocytes: 0 %
Lymphocytes Absolute: 3.7 10*3/uL — ABNORMAL HIGH (ref 0.7–3.1)
Lymphs: 42 %
MCH: 27.4 pg (ref 26.6–33.0)
MCHC: 32.1 g/dL (ref 31.5–35.7)
MCV: 85 fL (ref 79–97)
Monocytes Absolute: 0.5 10*3/uL (ref 0.1–0.9)
Monocytes: 6 %
Neutrophils Absolute: 4.4 10*3/uL (ref 1.4–7.0)
Neutrophils: 49 %
Platelets: 249 10*3/uL (ref 150–450)
RBC: 4.97 x10E6/uL (ref 3.77–5.28)
RDW: 13.3 % (ref 11.7–15.4)
WBC: 8.8 10*3/uL (ref 3.4–10.8)

## 2023-02-03 LAB — HEMOGLOBIN A1C
Est. average glucose Bld gHb Est-mCnc: 223 mg/dL
Hgb A1c MFr Bld: 9.4 % — ABNORMAL HIGH (ref 4.8–5.6)

## 2023-02-03 LAB — TSH+FREE T4
Free T4: 1.12 ng/dL (ref 0.82–1.77)
TSH: 1.96 u[IU]/mL (ref 0.450–4.500)

## 2023-02-03 LAB — VITAMIN D 25 HYDROXY (VIT D DEFICIENCY, FRACTURES): Vit D, 25-Hydroxy: 41.6 ng/mL (ref 30.0–100.0)

## 2023-02-03 MED ORDER — TIRZEPATIDE 2.5 MG/0.5ML ~~LOC~~ SOAJ: 2.5000 mg | SUBCUTANEOUS | 0 refills | Status: DC

## 2023-02-03 MED ORDER — GLIMEPIRIDE 4 MG PO TABS: 4.0000 mg | ORAL_TABLET | Freq: Every day | ORAL | 1 refills | Status: DC

## 2023-02-03 MED ORDER — ROSUVASTATIN CALCIUM 10 MG PO TABS: 10.0000 mg | ORAL_TABLET | Freq: Every day | ORAL | 1 refills | Status: DC

## 2023-02-03 NOTE — Progress Notes (Signed)
Please inform the patient that her hemoglobin A1c has increased from 7.8 to 9.4. After completing her current dose of Ozempic, she should start taking Mounjaro 2.5 mg weekly and request a medication refill monthly. Additionally, I have increased her glimepiride to 4 mg daily for better glycemic control. I recommend lifestyle changes, including decreasing her intake of high-sugar foods and beverages and increasing physical activity. Her cholesterol levels are elevated, and a prescription for rosuvastatin 10 mg daily has been sent to her pharmacy. I also recommend lifestyle changes by decreasing her intake of greasy, fatty, and starchy foods and increasing physical activity. All other labs are stable.

## 2023-02-17 ENCOUNTER — Other Ambulatory Visit: Payer: Self-pay | Admitting: Family Medicine

## 2023-02-17 DIAGNOSIS — E119 Type 2 diabetes mellitus without complications: Secondary | ICD-10-CM

## 2023-02-27 ENCOUNTER — Other Ambulatory Visit: Payer: Self-pay | Admitting: Family Medicine

## 2023-02-27 DIAGNOSIS — E1165 Type 2 diabetes mellitus with hyperglycemia: Secondary | ICD-10-CM

## 2023-03-01 ENCOUNTER — Other Ambulatory Visit: Payer: Self-pay | Admitting: Family Medicine

## 2023-03-01 DIAGNOSIS — E1165 Type 2 diabetes mellitus with hyperglycemia: Secondary | ICD-10-CM

## 2023-03-01 MED ORDER — TIRZEPATIDE 5 MG/0.5ML ~~LOC~~ SOAJ: 5.0000 mg | SUBCUTANEOUS | 0 refills | Status: DC

## 2023-03-18 ENCOUNTER — Other Ambulatory Visit: Payer: Self-pay | Admitting: Family Medicine

## 2023-03-18 DIAGNOSIS — E1165 Type 2 diabetes mellitus with hyperglycemia: Secondary | ICD-10-CM

## 2023-03-27 ENCOUNTER — Other Ambulatory Visit: Payer: Self-pay | Admitting: Family Medicine

## 2023-03-27 DIAGNOSIS — E1165 Type 2 diabetes mellitus with hyperglycemia: Secondary | ICD-10-CM

## 2023-03-27 MED ORDER — TIRZEPATIDE 7.5 MG/0.5ML ~~LOC~~ SOAJ
7.5000 mg | SUBCUTANEOUS | 0 refills | Status: DC
Start: 2023-03-27 — End: 2023-04-27

## 2023-04-11 ENCOUNTER — Other Ambulatory Visit: Payer: Self-pay | Admitting: Family Medicine

## 2023-04-11 DIAGNOSIS — E1165 Type 2 diabetes mellitus with hyperglycemia: Secondary | ICD-10-CM

## 2023-04-27 ENCOUNTER — Other Ambulatory Visit: Payer: Self-pay | Admitting: Family Medicine

## 2023-04-27 DIAGNOSIS — E1165 Type 2 diabetes mellitus with hyperglycemia: Secondary | ICD-10-CM

## 2023-04-27 MED ORDER — TIRZEPATIDE 10 MG/0.5ML ~~LOC~~ SOAJ: 10.0000 mg | SUBCUTANEOUS | 0 refills | Status: DC

## 2023-05-10 ENCOUNTER — Other Ambulatory Visit: Payer: Self-pay | Admitting: Family Medicine

## 2023-05-10 DIAGNOSIS — E1165 Type 2 diabetes mellitus with hyperglycemia: Secondary | ICD-10-CM

## 2023-05-18 ENCOUNTER — Ambulatory Visit: Admitting: Family Medicine

## 2023-05-26 ENCOUNTER — Other Ambulatory Visit: Payer: Self-pay | Admitting: Family Medicine

## 2023-05-26 DIAGNOSIS — E1165 Type 2 diabetes mellitus with hyperglycemia: Secondary | ICD-10-CM

## 2023-05-27 ENCOUNTER — Other Ambulatory Visit: Payer: Self-pay | Admitting: Family Medicine

## 2023-05-27 DIAGNOSIS — E1165 Type 2 diabetes mellitus with hyperglycemia: Secondary | ICD-10-CM

## 2023-05-27 MED ORDER — TIRZEPATIDE 12.5 MG/0.5ML ~~LOC~~ SOAJ: 12.5000 mg | SUBCUTANEOUS | 0 refills | Status: DC

## 2023-06-02 ENCOUNTER — Ambulatory Visit: Payer: 59 | Admitting: Family Medicine

## 2023-06-17 ENCOUNTER — Other Ambulatory Visit: Payer: Self-pay | Admitting: Family Medicine

## 2023-06-17 DIAGNOSIS — E1165 Type 2 diabetes mellitus with hyperglycemia: Secondary | ICD-10-CM

## 2023-06-18 ENCOUNTER — Encounter: Payer: Self-pay | Admitting: Family Medicine

## 2023-06-19 ENCOUNTER — Other Ambulatory Visit: Payer: Self-pay | Admitting: Family Medicine

## 2023-06-19 DIAGNOSIS — E1165 Type 2 diabetes mellitus with hyperglycemia: Secondary | ICD-10-CM

## 2023-06-23 ENCOUNTER — Ambulatory Visit (INDEPENDENT_AMBULATORY_CARE_PROVIDER_SITE_OTHER)

## 2023-06-23 VITALS — BP 96/68 | HR 89 | Resp 15 | Ht 63.0 in | Wt 186.1 lb

## 2023-06-23 DIAGNOSIS — R1084 Generalized abdominal pain: Secondary | ICD-10-CM

## 2023-06-23 DIAGNOSIS — K5900 Constipation, unspecified: Secondary | ICD-10-CM

## 2023-06-23 DIAGNOSIS — E1165 Type 2 diabetes mellitus with hyperglycemia: Secondary | ICD-10-CM

## 2023-06-23 NOTE — Progress Notes (Unsigned)
 Acute Office Visit  Subjective:     Patient ID: Jessica Carey, female    DOB: 04-09-1985, 38 y.o.   MRN: [id number]  Chief Complaint  Patient presents with   Constipation    Has been having issues with constipation and then that will turn into diarrhea. Some days she belches a lot and feel bloated and nauseated sometimes. Has been going on for over a month     HPI Patient is in today for constipation  ROS      Objective:    BP 96/68   Pulse 89   Resp 15   Ht 5\' 3"  (1.6 m)   Wt 186 lb 1.9 oz (84.4 kg)   SpO2 98%   BMI 32.97 kg/m  BP Readings from Last 3 Encounters:  06/23/23 96/68  02/02/23 111/70  01/06/23 116/70   Wt Readings from Last 3 Encounters:  06/23/23 186 lb 1.9 oz (84.4 kg)  02/02/23 185 lb 1.9 oz (84 kg)  01/06/23 186 lb 6.4 oz (84.6 kg)     Physical Exam Vitals and nursing note reviewed.  Constitutional:      Appearance: Normal appearance.  HENT:     Head: Normocephalic.     Right Ear: Tympanic membrane, ear canal and external ear normal.     Left Ear: Tympanic membrane, ear canal and external ear normal.     Nose: Nose normal.     Mouth/Throat:     Mouth: Mucous membranes are moist.     Pharynx: Oropharynx is clear.  Cardiovascular:     Rate and Rhythm: Normal rate and regular rhythm.  Pulmonary:     Effort: Pulmonary effort is normal.     Breath sounds: Normal breath sounds.  Abdominal:     General: Abdomen is flat. Bowel sounds are decreased.     Palpations: Abdomen is soft.     Tenderness: There is generalized abdominal tenderness.  Musculoskeletal:     Cervical back: Normal range of motion and neck supple.  Skin:    General: Skin is warm and dry.  Neurological:     Mental Status: She is alert and oriented to person, place, and time.  Psychiatric:        Mood and Affect: Mood normal.        Thought Content: Thought content normal.     Results for orders placed or performed in visit on 06/23/23  HgB A1c  Result Value Ref  Range   Hgb A1c MFr Bld 6.7 (H) 4.8 - 5.6 %   Est. average glucose Bld gHb Est-mCnc 146 mg/dL  XBM84+XLKG  Result Value Ref Range   Glucose 81 70 - 99 mg/dL   BUN 16 6 - 20 mg/dL   Creatinine, Ser 4.01 0.57 - 1.00 mg/dL   eGFR 027 >25 DG/UYQ/0.34   BUN/Creatinine Ratio 21 9 - 23   Sodium 143 134 - 144 mmol/L   Potassium 4.6 3.5 - 5.2 mmol/L   Chloride 106 96 - 106 mmol/L   CO2 22 20 - 29 mmol/L   Calcium  9.3 8.7 - 10.2 mg/dL   Total Protein 7.5 6.0 - 8.5 g/dL   Albumin 4.0 3.9 - 4.9 g/dL   Globulin, Total 3.5 1.5 - 4.5 g/dL   Bilirubin Total 0.3 0.0 - 1.2 mg/dL   Alkaline Phosphatase 84 44 - 121 IU/L   AST 18 0 - 40 IU/L   ALT 17 0 - 32 IU/L  Lipase  Result Value Ref Range   Lipase  74 (H) 14 - 72 U/L  Amylase  Result Value Ref Range   Amylase 99 31 - 110 U/L        Assessment & Plan:   Problem List Items Addressed This Visit       Endocrine   Uncontrolled diabetes mellitus with hyperglycemia, without long-term current use of insulin  (HCC) - Primary   Relevant Orders   HgB A1c (Completed)   CMP14+EGFR (Completed)   Other Visit Diagnoses       Generalized abdominal pain       check labs for further evaluation and refer to GI   Relevant Orders   Lipase (Completed)   Amylase (Completed)     Constipation, unspecified constipation type       Recommend increasing intake of fiber and water.  recommend adding Miralax  as well for relief of constipation. Referral placed to GI for further evaluation.   Relevant Orders   Ambulatory referral to Gastroenterology       No orders of the defined types were placed in this encounter.   No follow-ups on file.  Alison Irvine, FNP

## 2023-06-24 LAB — CMP14+EGFR
ALT: 17 IU/L (ref 0–32)
AST: 18 IU/L (ref 0–40)
Albumin: 4 g/dL (ref 3.9–4.9)
Alkaline Phosphatase: 84 IU/L (ref 44–121)
BUN/Creatinine Ratio: 21 (ref 9–23)
BUN: 16 mg/dL (ref 6–20)
Bilirubin Total: 0.3 mg/dL (ref 0.0–1.2)
CO2: 22 mmol/L (ref 20–29)
Calcium: 9.3 mg/dL (ref 8.7–10.2)
Chloride: 106 mmol/L (ref 96–106)
Creatinine, Ser: 0.78 mg/dL (ref 0.57–1.00)
Globulin, Total: 3.5 g/dL (ref 1.5–4.5)
Glucose: 81 mg/dL (ref 70–99)
Potassium: 4.6 mmol/L (ref 3.5–5.2)
Sodium: 143 mmol/L (ref 134–144)
Total Protein: 7.5 g/dL (ref 6.0–8.5)
eGFR: 101 mL/min/{1.73_m2} (ref >59)

## 2023-06-24 LAB — LIPASE: Lipase: 74 U/L — ABNORMAL HIGH (ref 14–72)

## 2023-06-24 LAB — HEMOGLOBIN A1C
Est. average glucose Bld gHb Est-mCnc: 146 mg/dL
Hgb A1c MFr Bld: 6.7 % — ABNORMAL HIGH (ref 4.8–5.6)

## 2023-06-24 LAB — AMYLASE: Amylase: 99 U/L (ref 31–110)

## 2023-06-29 ENCOUNTER — Ambulatory Visit: Payer: Self-pay

## 2023-06-30 NOTE — Progress Notes (Unsigned)
 Referring Provider: Alison Irvine, FNP  Primary Care Physician:  Zarwolo, Gloria, FNP Primary Gastroenterologist:  Dr. Riley Cheadle  Chief Complaint  Patient presents with   Constipation    Alternating constipation and diarrhea. Has a lot of gas and bloating.     HPI:   Jessica Carey is a 38 y.o. female presenting today at the request of Alison Irvine, FNP for constipation.   Reviewed last OV with Alison Irvine, FNP dated 06/23/23. Patient reporting 1+ month history of issues with constipation that will turn into diarrhea. Some days she belches a lot and feel bloated and nauseated sometimes. Noted generalized abdominal TTP on exam. Labs ordered. Recommend increasing intake of fiber and water. Recommend adding Miralax  as well for relief of constipation. Referral placed to GI.   Labs 06/23/23: CMP wnl.  Lipase elevated at 74.  Amylase within normal limits Hemoglobin A1c 6.7, down from 9.4 in December 2024.  Today:  Alternating constipation and diarrhea:  Will get diarrhea and other times will feel like she has to have a BM, but can't. This started about 1-2 months ago. Skips 2 days between a bowel movement. Sometimes will pass small little balls of stool. Then will get diarrhea. Will have a couple of loose mushy stools per day when diarrhea happens. Diarrhea usually last a couple days. Last week, she had more diarrhea than normal with watery stool.   Doesn't drink a lot of water.    Was on ozempic  in the past and had terrible constipation. Started Mounjaro about 6 months ago.   Had some lower abdominal cramping starting the later part of 2024. Seemed to go away. Came back in January. Would come on for a few days, then go away.  Did seem to improve with a bowel movement.  Last had pain over 2 months ago.   No brbpr or melena.  No unintentional weight loss.  Hasn't tried anything to help with bowel movements.   No Fhx of colon cancer.   Nausea/belching: Increased burping recently, but  not regularly. Sometimes with nausea over the last month with the fluctuation in her bowels. No vomiting. If eating something heavy at night, would get heartburn, but since eating lighter, she is not having any issues. Works night shift.   Elevated Lipase: No epigastric pain.  No Fhx of pancreatitis.   No regular NSAID use.   Past Medical History:  Diagnosis Date   ASCUS of cervix with negative high risk HPV 12/24/2020   12/24/20 repeat pap in 3 years per [facility],5 year risk CIN3+ is 0.40%   Boil, breast 07/04/2013   Diabetes (HCC) 08/04/2013   Diabetes mellitus without complication (HCC)    glyburide  and insulin    Dysrhythmia    a-fib   Hyperlipidemia    Irregular periods     Past Surgical History:  Procedure Laterality Date   BREAST SURGERY Right    breast abcess   ELECTROPHYSIOLOGY STUDY N/A 03/22/2018   Procedure: ELECTROPHYSIOLOGY STUDY;  Surgeon: Tammie Fall, MD;  Location: [facility];  Service: Cardiovascular;  Laterality: N/A;   PERINEAL LACERATION REPAIR N/A 03/17/2018   Procedure: SUTURE REPAIR PERINEAL LACERATION;  Surgeon: Julianne Octave, MD;  Location: [facility];  Service: Gynecology;  Laterality: N/A;   SURGERY OF LIP     SVT ABLATION N/A 03/22/2018   Procedure: SVT ABLATION;  Surgeon: Tammie Fall, MD;  Location: [facility];  Service: Cardiovascular;  Laterality: N/A;    Current Outpatient Medications  Medication Sig Dispense Refill   blood glucose meter kit and supplies Dispense based on patient and insurance preference. Use up to four times daily as directed. (FOR ICD-10 E10.9, E11.9). 1 each 0   glimepiride  (AMARYL ) 4 MG tablet Take 1 tablet (4 mg total) by mouth daily before breakfast. 90 tablet 1   glucose blood (ONETOUCH VERIO) test strip USE 1 STRIP TO CHECK GLUCOSE 4 TIMES DAILY AS DIRECTED 100 each 0   Insulin  Pen Needle (BD PEN NEEDLE NANO U/F) 32G X 4 MM MISC Use with Ozempic  100 each 5   Lancets (ONETOUCH DELICA PLUS  LANCET33G) MISC as directed 100 each 0   Omega 3 1000 MG CAPS Take 1 daily 60 capsule    rosuvastatin  (CRESTOR ) 10 MG tablet Take 1 tablet (10 mg total) by mouth daily. 90 tablet 1   tirzepatide (MOUNJARO) 12.5 MG/0.5ML Pen Inject 12.5 mg into the skin once a week. 2 mL 2   Cholecalciferol (VITAMIN D3) 25 MCG (1000 UT) CAPS Take 1 capsule (1,000 Units total) by mouth daily. (Patient not taking: Reported on 07/02/2023) 60 capsule 3   No current facility-administered medications for this visit.    Allergies as of 07/02/2023   (No Known Allergies)    Family History  Problem Relation Age of Onset   Diabetes Paternal Grandmother    Diabetes Father    Hyperlipidemia Father    Diabetes Mother    Diabetes Sister    Cancer Maternal Aunt        liver    Social History   Socioeconomic History   Marital status: Single    Spouse name: Not on file   Number of children: 1   Years of education: Not on file   Highest education level: Not on file  Occupational History   Not on file  Tobacco Use   Smoking status: Never   Smokeless tobacco: Never  Vaping Use   Vaping status: Never Used  Substance and Sexual Activity   Alcohol use: Not Currently    Comment: occ.   Drug use: No   Sexual activity: Not Currently    Partners: Male    Birth control/protection: None  Other Topics Concern   Not on file  Social History Narrative   Not on file   Social Drivers of Health   Financial Resource Strain: Low Risk  (11/26/2022)   Overall Financial Resource Strain (CARDIA)    Difficulty of Paying Living Expenses: Not hard at all  Food Insecurity: No Food Insecurity (11/26/2022)   Hunger Vital Sign    Worried About Running Out of Food in the Last Year: Never true    Ran Out of Food in the Last Year: Never true  Transportation Needs: No Transportation Needs (11/26/2022)   PRAPARE - Administrator, Civil Service (Medical): No    Lack of Transportation (Non-Medical): No  Physical  Activity: Insufficiently Active (11/26/2022)   Exercise Vital Sign    Days of Exercise per Week: 1 day    Minutes of Exercise per Session: 20 min  Stress: No Stress Concern Present (11/26/2022)   [employer] of Occupational Health - Occupational Stress Questionnaire    Feeling of Stress : Not at all  Social Connections: Socially Isolated (11/26/2022)   Social Connection and Isolation Panel [NHANES]    Frequency of Communication with Friends and Family: Once a week    Frequency of Social Gatherings with Friends and Family: Once a week    Attends Religious  Services: More than 4 times per year    Active Member of Clubs or Organizations: No    Attends Banker Meetings: Never    Marital Status: Divorced  Catering manager Violence: Not At Risk (11/26/2022)   Humiliation, Afraid, Rape, and Kick questionnaire    Fear of Current or Ex-Partner: No    Emotionally Abused: No    Physically Abused: No    Sexually Abused: No    Review of Systems: Gen: Denies any fever, chills, cold or flulike symptoms, presyncope, syncope. CV: Denies chest pain, heart palpitations. Resp: Denies shortness of breath, cough. GI: See HPI GU : Denies urinary burning, urinary frequency, urinary hesitancy MS: Denies joint pain. Derm: Denies rash. Psych: Denies depression, anxiety. Heme: Denies bruising, bleeding.  Physical Exam: BP 117/77 (BP Location: Right Arm, Patient Position: Sitting, Cuff Size: Large)   Pulse 65   Temp 97.7 F (36.5 C) (Temporal)   Ht 5\' 2"  (1.575 m)   Wt 187 lb 12.8 oz (85.2 kg)   LMP 06/22/2023 (Approximate)   BMI 34.35 kg/m  General:   Alert and oriented. Pleasant and cooperative. Well-nourished and well-developed.  Head:  Normocephalic and atraumatic. Eyes:  Without icterus, sclera clear and conjunctiva pink.  Ears:  Normal auditory acuity. Lungs:  Clear to auscultation bilaterally. No wheezes, rales, or rhonchi. No distress.  Heart:  S1, S2 present without  murmurs appreciated.  Abdomen:  +BS, soft, non-tender and non-distended. No HSM noted. No guarding or rebound. No masses appreciated.  Rectal:  Deferred  Msk:  Symmetrical without gross deformities. Normal posture. Extremities:  Without edema. Neurologic:  Alert and  oriented x4;  grossly normal neurologically. Skin:  Intact without significant lesions or rashes. Psych:   Normal mood and affect.    Assessment:  38 year old female with history of atrial fibrillation s/p ablation, HLD, diabetes, presenting today for further evaluation of alternating constipation and diarrhea.  Alternate constipation and diarrhea: Likely baseline constipation, possibly driven by Mounjaro, with overflow diarrhea. No alarm symptoms. Will try starting fiber and MiraLAX .   Nausea/belching:  Intermittent since bowel issues have been more of a problem. No heartburn, abdominal pain, vomiting. Could be secondary to constipation, atypical GERD or possibly Mounjaro. Patient prefers to monitor for now. If persistent symptoms, would consider checking for H pylori and if negative, start daily PPI and monitor.   Elevate Lipase: Very mildly elevated lipase of 74 on labs completed 06/23/2023.  She has no typical symptoms of pancreatitis.  No family history of pancreatitis or pancreatic cancer.  Will plan to recheck lipase next week.  If persistent elevation or increasing, would recommend CT with pancreatic protocol.  If unrevealing, could consider EGD to evaluate for gastritis, duodenitis, PUD as this can also cause elevated lipase. She has no CKD.    Plan:  Lipase Start Benefiber 1 tablespoon daily x 2 weeks, then increase to twice daily Start MiraLAX  17 g daily After bowels are consistently moving well, can try stopping MiraLAX  and monitoring, but if she has recurrent constipation, recommend resuming MiraLAX  daily or every other day. Follow-up in 8 weeks or sooner if needed.   Shana Daring, PA-C [facility]  Gastroenterology 07/02/2023

## 2023-07-02 ENCOUNTER — Encounter: Payer: Self-pay | Admitting: Gastroenterology

## 2023-07-02 ENCOUNTER — Ambulatory Visit (INDEPENDENT_AMBULATORY_CARE_PROVIDER_SITE_OTHER): Admitting: Gastroenterology

## 2023-07-02 VITALS — BP 117/77 | HR 65 | Temp 97.7°F | Ht 62.0 in | Wt 187.8 lb

## 2023-07-02 DIAGNOSIS — R110 Nausea: Secondary | ICD-10-CM

## 2023-07-02 DIAGNOSIS — R142 Eructation: Secondary | ICD-10-CM | POA: Diagnosis not present

## 2023-07-02 DIAGNOSIS — R748 Abnormal levels of other serum enzymes: Secondary | ICD-10-CM | POA: Diagnosis not present

## 2023-07-02 DIAGNOSIS — R197 Diarrhea, unspecified: Secondary | ICD-10-CM | POA: Diagnosis not present

## 2023-07-02 DIAGNOSIS — R198 Other specified symptoms and signs involving the digestive system and abdomen: Secondary | ICD-10-CM

## 2023-07-02 DIAGNOSIS — K5900 Constipation, unspecified: Secondary | ICD-10-CM

## 2023-07-02 NOTE — Patient Instructions (Signed)
 Start benefiber 1 tablespoon daily x 2 weeks, then increase to twice daily.   Start MiraLAX  1 capful (17 g) daily in 8 ounces of water or other noncarbonated beverage of your choice.  After you like your bowels are moving very well on a daily basis, you can try stopping MiraLAX  and monitor your bowel movements.  If you start to have intermittent constipation again, I would like for you to resume MiraLAX  daily or every other day.  As we discussed, you will likely have some diarrhea initially with starting MiraLAX , but this should taper off.   Next Tuesday, please go to [facility] to have your lipase level rechecked.   I will plan to see you back in 8 weeks or sooner if needed.

## 2023-07-07 ENCOUNTER — Other Ambulatory Visit: Payer: Self-pay | Admitting: Family Medicine

## 2023-07-07 DIAGNOSIS — E1165 Type 2 diabetes mellitus with hyperglycemia: Secondary | ICD-10-CM

## 2023-07-08 ENCOUNTER — Ambulatory Visit: Payer: Self-pay | Admitting: Gastroenterology

## 2023-07-08 LAB — LIPASE: Lipase: 39 U/L (ref 14–72)

## 2023-07-09 ENCOUNTER — Ambulatory Visit: Admitting: Family Medicine

## 2023-07-31 ENCOUNTER — Ambulatory Visit (INDEPENDENT_AMBULATORY_CARE_PROVIDER_SITE_OTHER): Admitting: Family Medicine

## 2023-07-31 ENCOUNTER — Encounter: Payer: Self-pay | Admitting: Family Medicine

## 2023-07-31 VITALS — BP 109/75 | HR 70 | Resp 16 | Ht 64.0 in | Wt 183.0 lb

## 2023-07-31 DIAGNOSIS — E1165 Type 2 diabetes mellitus with hyperglycemia: Secondary | ICD-10-CM | POA: Diagnosis not present

## 2023-07-31 DIAGNOSIS — Z1159 Encounter for screening for other viral diseases: Secondary | ICD-10-CM

## 2023-07-31 DIAGNOSIS — E7849 Other hyperlipidemia: Secondary | ICD-10-CM

## 2023-07-31 DIAGNOSIS — E559 Vitamin D deficiency, unspecified: Secondary | ICD-10-CM | POA: Diagnosis not present

## 2023-07-31 DIAGNOSIS — Z7984 Long term (current) use of oral hypoglycemic drugs: Secondary | ICD-10-CM

## 2023-07-31 DIAGNOSIS — E1169 Type 2 diabetes mellitus with other specified complication: Secondary | ICD-10-CM

## 2023-07-31 DIAGNOSIS — R7301 Impaired fasting glucose: Secondary | ICD-10-CM

## 2023-07-31 DIAGNOSIS — E038 Other specified hypothyroidism: Secondary | ICD-10-CM

## 2023-07-31 MED ORDER — GLIMEPIRIDE 4 MG PO TABS: 4.0000 mg | ORAL_TABLET | Freq: Every day | ORAL | 1 refills | Status: DC

## 2023-07-31 NOTE — Assessment & Plan Note (Signed)
 The patient remains stable on Mounjaro  12.5 mg weekly and Glimepiride  4 mg daily. She is encouraged to continue her current treatment regimen, while also decreasing intake of high-sugar foods and beverages and increasing physical activity to support better glycemic control.

## 2023-07-31 NOTE — Patient Instructions (Addendum)
 I appreciate the opportunity to provide care to you today!    Follow up:  4 months  Labs: please stop by the lab today to get your blood drawn (CBC, CMP, TSH, Lipid profile, HgA1c, Vit D)  Schedule diabetic eye exam   Please follow up if your symptoms worsen or fail to improve.    Please continue to a heart-healthy diet and increase your physical activities. Try to exercise for at least five days a week.    It was a pleasure to see you and I look forward to continuing to work together on your health and well-being. Please do not hesitate to call the office if you need care or have questions about your care.  In case of emergency, please visit the Emergency Department for urgent care, or contact our clinic at [phone] to schedule an appointment. We're here to help you!   Have a wonderful day and week. With Gratitude, Masayuki Sakai MSN, FNP-BC

## 2023-07-31 NOTE — Assessment & Plan Note (Signed)
 The patient was encouraged to continue taking rosuvastatin 10 mg daily for cholesterol management. Lifestyle modifications were also discussed, including avoiding simple carbohydrates such as cakes, sweet desserts, ice cream, soda (diet or regular), sweet tea, candies, chips, cookies, store-bought juices, excessive alcohol (more than 1-2 drinks per day), lemonade, artificial sweeteners, donuts, coffee creamers, and sugar-free products. Additionally, the patient was advised to reduce the consumption of greasy, fatty foods and increase physical activity to support cardiovascular health. The patient verbalized understanding and is aware of the plan of care.

## 2023-07-31 NOTE — Progress Notes (Signed)
 Established Patient Office Visit  Subjective:  Patient ID: Jessica Carey, female    DOB: 16-May-1985  Age: 38 y.o. MRN: [id number]  CC:  Chief Complaint  Patient presents with   Follow-up    With lab review     HPI Jessica Carey is a 38 y.o. female with past medical history of type 2 diabetes, hyperlipidemia presents for f/u of  chronic medical conditions.  For the details of today's visit, please refer to the assessment and plan.      Past Medical History:  Diagnosis Date   ASCUS of cervix with negative high risk HPV 12/24/2020   12/24/20 repeat pap in 3 years per [facility],5 year risk CIN3+ is 0.40%   Boil, breast 07/04/2013   Diabetes (HCC) 08/04/2013   Diabetes mellitus without complication (HCC)    glyburide  and insulin    Dysrhythmia    a-fib   Hyperlipidemia    Irregular periods     Past Surgical History:  Procedure Laterality Date   BREAST SURGERY Right    breast abcess   ELECTROPHYSIOLOGY STUDY N/A 03/22/2018   Procedure: ELECTROPHYSIOLOGY STUDY;  Surgeon: Waddell Danelle ORN, MD;  Location: [facility];  Service: Cardiovascular;  Laterality: N/A;   PERINEAL LACERATION REPAIR N/A 03/17/2018   Procedure: SUTURE REPAIR PERINEAL LACERATION;  Surgeon: Herchel Gloris LABOR, MD;  Location: [facility];  Service: Gynecology;  Laterality: N/A;   SURGERY OF LIP     SVT ABLATION N/A 03/22/2018   Procedure: SVT ABLATION;  Surgeon: Waddell Danelle ORN, MD;  Location: [facility];  Service: Cardiovascular;  Laterality: N/A;    Family History  Problem Relation Age of Onset   Diabetes Paternal Grandmother    Diabetes Father    Hyperlipidemia Father    Diabetes Mother    Diabetes Sister    Cancer Maternal Aunt        liver    Social History   Socioeconomic History   Marital status: Single    Spouse name: Not on file   Number of children: 1   Years of education: Not on file   Highest education level: Not on file  Occupational History   Not on file  Tobacco  Use   Smoking status: Never   Smokeless tobacco: Never  Vaping Use   Vaping status: Never Used  Substance and Sexual Activity   Alcohol use: Not Currently    Comment: occ.   Drug use: No   Sexual activity: Not Currently    Partners: Male    Birth control/protection: None  Other Topics Concern   Not on file  Social History Narrative   Not on file   Social Drivers of Health   Financial Resource Strain: Low Risk  (11/26/2022)   Overall Financial Resource Strain (CARDIA)    Difficulty of Paying Living Expenses: Not hard at all  Food Insecurity: No Food Insecurity (11/26/2022)   Hunger Vital Sign    Worried About Running Out of Food in the Last Year: Never true    Ran Out of Food in the Last Year: Never true  Transportation Needs: No Transportation Needs (11/26/2022)   PRAPARE - Administrator, Civil Service (Medical): No    Lack of Transportation (Non-Medical): No  Physical Activity: Insufficiently Active (11/26/2022)   Exercise Vital Sign    Days of Exercise per Week: 1 day    Minutes of Exercise per Session: 20 min  Stress: No Stress Concern Present (11/26/2022)  [employer] of Occupational Health - Occupational Stress Questionnaire    Feeling of Stress : Not at all  Social Connections: Socially Isolated (11/26/2022)   Social Connection and Isolation Panel    Frequency of Communication with Friends and Family: Once a week    Frequency of Social Gatherings with Friends and Family: Once a week    Attends Religious Services: More than 4 times per year    Active Member of [employer] or Organizations: No    Attends Banker Meetings: Never    Marital Status: Divorced  Catering manager Violence: Not At Risk (11/26/2022)   Humiliation, Afraid, Rape, and Kick questionnaire    Fear of Current or Ex-Partner: No    Emotionally Abused: No    Physically Abused: No    Sexually Abused: No    Outpatient Medications Prior to Visit  Medication Sig Dispense  Refill   blood glucose meter kit and supplies Dispense based on patient and insurance preference. Use up to four times daily as directed. (FOR ICD-10 E10.9, E11.9). 1 each 0   Lancets (ONETOUCH DELICA PLUS LANCET33G) MISC as directed 100 each 0   Omega 3 1000 MG CAPS Take 1 daily 60 capsule    ONETOUCH VERIO test strip USE 1 STRIP TO CHECK GLUCOSE 4 TIMES DAILY AS DIRECTED 100 each 0   rosuvastatin  (CRESTOR ) 10 MG tablet Take 1 tablet (10 mg total) by mouth daily. 90 tablet 1   tirzepatide  (MOUNJARO ) 12.5 MG/0.5ML Pen Inject 12.5 mg into the skin once a week. 2 mL 2   glimepiride  (AMARYL ) 4 MG tablet Take 1 tablet (4 mg total) by mouth daily before breakfast. 90 tablet 1   Cholecalciferol (VITAMIN D3) 25 MCG (1000 UT) CAPS Take 1 capsule (1,000 Units total) by mouth daily. (Patient not taking: Reported on 07/31/2023) 60 capsule 3   Insulin  Pen Needle (BD PEN NEEDLE NANO U/F) 32G X 4 MM MISC Use with Ozempic  100 each 5   No facility-administered medications prior to visit.    No Known Allergies  ROS Review of Systems  Constitutional:  Negative for chills and fever.  Eyes:  Negative for visual disturbance.  Respiratory:  Negative for chest tightness and shortness of breath.   Neurological:  Negative for dizziness and headaches.      Objective:    Physical Exam HENT:     Head: Normocephalic.     Mouth/Throat:     Mouth: Mucous membranes are moist.   Cardiovascular:     Rate and Rhythm: Normal rate.     Heart sounds: Normal heart sounds.  Pulmonary:     Effort: Pulmonary effort is normal.     Breath sounds: Normal breath sounds.   Neurological:     Mental Status: She is alert.     BP 109/75   Pulse 70   Resp 16   Ht 5' 4 (1.626 m)   Wt 183 lb (83 kg)   LMP 06/22/2023 (Approximate)   SpO2 93%   BMI 31.41 kg/m  Wt Readings from Last 3 Encounters:  07/31/23 183 lb (83 kg)  07/02/23 187 lb 12.8 oz (85.2 kg)  06/23/23 186 lb 1.9 oz (84.4 kg)    Lab Results   Component Value Date   TSH 1.960 02/02/2023   Lab Results  Component Value Date   WBC 8.8 02/02/2023   HGB 13.6 02/02/2023   HCT 42.4 02/02/2023   MCV 85 02/02/2023   PLT 249 02/02/2023   Lab Results  Component Value Date   NA 143 06/23/2023   K 4.6 06/23/2023   CO2 22 06/23/2023   GLUCOSE 81 06/23/2023   BUN 16 06/23/2023   CREATININE 0.78 06/23/2023   BILITOT 0.3 06/23/2023   ALKPHOS 84 06/23/2023   AST 18 06/23/2023   ALT 17 06/23/2023   PROT 7.5 06/23/2023   ALBUMIN 4.0 06/23/2023   CALCIUM  9.3 06/23/2023   ANIONGAP 8 03/22/2018   EGFR 101 06/23/2023   Lab Results  Component Value Date   CHOL 202 (H) 02/02/2023   Lab Results  Component Value Date   HDL 34 (L) 02/02/2023   Lab Results  Component Value Date   LDLCALC 133 (H) 02/02/2023   Lab Results  Component Value Date   TRIG 192 (H) 02/02/2023   Lab Results  Component Value Date   CHOLHDL 5.9 (H) 02/02/2023   Lab Results  Component Value Date   HGBA1C 6.7 (H) 06/23/2023      Assessment & Plan:  Uncontrolled diabetes mellitus with hyperglycemia, without long-term current use of insulin  (HCC) Assessment & Plan: The patient remains stable on Mounjaro  12.5 mg weekly and Glimepiride  4 mg daily. She is encouraged to continue her current treatment regimen, while also decreasing intake of high-sugar foods and beverages and increasing physical activity to support better glycemic control.  Orders: -     Glimepiride ; Take 1 tablet (4 mg total) by mouth daily before breakfast.  Dispense: 90 tablet; Refill: 1  Hyperlipidemia associated with type 2 diabetes mellitus (HCC) Assessment & Plan: The patient was encouraged to continue taking rosuvastatin  10 mg daily for cholesterol management. Lifestyle modifications were also discussed, including avoiding simple carbohydrates such as cakes, sweet desserts, ice cream, soda (diet or regular), sweet tea, candies, chips, cookies, store-bought juices, excessive  alcohol (more than 1-2 drinks per day), lemonade, artificial sweeteners, donuts, coffee creamers, and sugar-free products. Additionally, the patient was advised to reduce the consumption of greasy, fatty foods and increase physical activity to support cardiovascular health. The patient verbalized understanding and is aware of the plan of care.    IFG (impaired fasting glucose)  Vitamin D  deficiency -     VITAMIN D  25 Hydroxy (Vit-D Deficiency, Fractures)  Need for hepatitis C screening test  TSH (thyroid -stimulating hormone deficiency) -     TSH + free T4  Other hyperlipidemia -     Lipid panel -     CBC with Differential/Platelet  Note: This chart has been completed using Engineer, civil (consulting) software, and while attempts have been made to ensure accuracy, certain words and phrases may not be transcribed as intended.    Follow-up: Return in about 4 months (around 11/30/2023).   Sufyan Meidinger, FNP

## 2023-08-01 LAB — CBC WITH DIFFERENTIAL/PLATELET
Basophils Absolute: 0 10*3/uL (ref 0.0–0.2)
Basos: 0 %
EOS (ABSOLUTE): 0.2 10*3/uL (ref 0.0–0.4)
Eos: 2 %
Hematocrit: 41.3 % (ref 34.0–46.6)
Hemoglobin: 12.9 g/dL (ref 11.1–15.9)
Immature Grans (Abs): 0 10*3/uL (ref 0.0–0.1)
Immature Granulocytes: 0 %
Lymphocytes Absolute: 3.3 10*3/uL — ABNORMAL HIGH (ref 0.7–3.1)
Lymphs: 34 %
MCH: 27.4 pg (ref 26.6–33.0)
MCHC: 31.2 g/dL — ABNORMAL LOW (ref 31.5–35.7)
MCV: 88 fL (ref 79–97)
Monocytes Absolute: 0.6 10*3/uL (ref 0.1–0.9)
Monocytes: 6 %
Neutrophils Absolute: 5.6 10*3/uL (ref 1.4–7.0)
Neutrophils: 58 %
Platelets: 222 10*3/uL (ref 150–450)
RBC: 4.71 x10E6/uL (ref 3.77–5.28)
RDW: 13.6 % (ref 11.7–15.4)
WBC: 9.7 10*3/uL (ref 3.4–10.8)

## 2023-08-01 LAB — LIPID PANEL
Chol/HDL Ratio: 2.8 ratio (ref 0.0–4.4)
Cholesterol, Total: 98 mg/dL — ABNORMAL LOW (ref 100–199)
HDL: 35 mg/dL — ABNORMAL LOW (ref >39)
LDL Chol Calc (NIH): 47 mg/dL (ref 0–99)
Triglycerides: 76 mg/dL (ref 0–149)
VLDL Cholesterol Cal: 16 mg/dL (ref 5–40)

## 2023-08-01 LAB — VITAMIN D 25 HYDROXY (VIT D DEFICIENCY, FRACTURES): Vit D, 25-Hydroxy: 44.7 ng/mL (ref 30.0–100.0)

## 2023-08-01 LAB — TSH+FREE T4
Free T4: 1.07 ng/dL (ref 0.82–1.77)
TSH: 0.856 u[IU]/mL (ref 0.450–4.500)

## 2023-08-03 ENCOUNTER — Other Ambulatory Visit: Payer: Self-pay | Admitting: Family Medicine

## 2023-08-03 DIAGNOSIS — E1165 Type 2 diabetes mellitus with hyperglycemia: Secondary | ICD-10-CM

## 2023-08-07 ENCOUNTER — Ambulatory Visit: Payer: Self-pay | Admitting: Family Medicine

## 2023-08-11 ENCOUNTER — Ambulatory Visit

## 2023-08-25 NOTE — Progress Notes (Unsigned)
 Referring Provider: Zarwolo, Gloria, FNP Primary Care Physician:  Zarwolo, Gloria, FNP Primary GI Physician: Dr. Shaaron  No chief complaint on file.   HPI:   Jessica Carey is a 38 y.o. female with history of atrial fibrillation s/p ablation, HLD, diabetes, presenting today for follow-up of off-and-on constipation and diarrhea, nausea/belching.  Last seen in the office 07/02/2023.  Suspected she was dealing with baseline constipation possibly driven by Mounjaro  with overflow diarrhea.  Recommended starting fiber and MiraLAX .  Noted intermittent nausea and belching since and bowel issues.  No heartburn, abdominal pain, vomiting.  Queried whether symptoms were related to constipation, atypical GERD, or side effect of Mounjaro .  Recommended monitoring for now and if persistent symptoms despite improvement in bowel function, consider checking for H. pylori and if negative, starting daily PPI.  Today:   Past Medical History:  Diagnosis Date   ASCUS of cervix with negative high risk HPV 12/24/2020   12/24/20 repeat pap in 3 years per [facility],5 year risk CIN3+ is 0.40%   Boil, breast 07/04/2013   Diabetes (HCC) 08/04/2013   Diabetes mellitus without complication (HCC)    glyburide  and insulin    Dysrhythmia    a-fib   Hyperlipidemia    Irregular periods     Past Surgical History:  Procedure Laterality Date   BREAST SURGERY Right    breast abcess   ELECTROPHYSIOLOGY STUDY N/A 03/22/2018   Procedure: ELECTROPHYSIOLOGY STUDY;  Surgeon: Waddell Danelle ORN, MD;  Location: [facility];  Service: Cardiovascular;  Laterality: N/A;   PERINEAL LACERATION REPAIR N/A 03/17/2018   Procedure: SUTURE REPAIR PERINEAL LACERATION;  Surgeon: Herchel Gloris LABOR, MD;  Location: [facility];  Service: Gynecology;  Laterality: N/A;   SURGERY OF LIP     SVT ABLATION N/A 03/22/2018   Procedure: SVT ABLATION;  Surgeon: Waddell Danelle ORN, MD;  Location: [facility];  Service: Cardiovascular;   Laterality: N/A;    Current Outpatient Medications  Medication Sig Dispense Refill   blood glucose meter kit and supplies Dispense based on patient and insurance preference. Use up to four times daily as directed. (FOR ICD-10 E10.9, E11.9). 1 each 0   Cholecalciferol (VITAMIN D3) 25 MCG (1000 UT) CAPS Take 1 capsule (1,000 Units total) by mouth daily. (Patient not taking: Reported on 07/31/2023) 60 capsule 3   glimepiride  (AMARYL ) 4 MG tablet Take 1 tablet (4 mg total) by mouth daily before breakfast. 90 tablet 1   glucose blood (ONETOUCH VERIO) test strip USE TEST STRIP TO CHECK GLUCOSE 4 TIMES DAILY AS DIRECTED 100 each 0   Insulin  Pen Needle (BD PEN NEEDLE NANO U/F) 32G X 4 MM MISC Use with Ozempic  100 each 5   Lancets (ONETOUCH DELICA PLUS LANCET33G) MISC as directed 100 each 0   Omega 3 1000 MG CAPS Take 1 daily 60 capsule    rosuvastatin  (CRESTOR ) 10 MG tablet Take 1 tablet (10 mg total) by mouth daily. 90 tablet 1   tirzepatide  (MOUNJARO ) 12.5 MG/0.5ML Pen Inject 12.5 mg into the skin once a week. 2 mL 2   No current facility-administered medications for this visit.    Allergies as of 08/27/2023   (No Known Allergies)    Family History  Problem Relation Age of Onset   Diabetes Paternal Grandmother    Diabetes Father    Hyperlipidemia Father    Diabetes Mother    Diabetes Sister    Cancer Maternal Aunt        liver  Social History   Socioeconomic History   Marital status: Single    Spouse name: Not on file   Number of children: 1   Years of education: Not on file   Highest education level: Not on file  Occupational History   Not on file  Tobacco Use   Smoking status: Never   Smokeless tobacco: Never  Vaping Use   Vaping status: Never Used  Substance and Sexual Activity   Alcohol use: Not Currently    Comment: occ.   Drug use: No   Sexual activity: Not Currently    Partners: Male    Birth control/protection: None  Other Topics Concern   Not on file   Social History Narrative   Not on file   Social Drivers of Health   Financial Resource Strain: Low Risk  (11/26/2022)   Overall Financial Resource Strain (CARDIA)    Difficulty of Paying Living Expenses: Not hard at all  Food Insecurity: No Food Insecurity (11/26/2022)   Hunger Vital Sign    Worried About Running Out of Food in the Last Year: Never true    Ran Out of Food in the Last Year: Never true  Transportation Needs: No Transportation Needs (11/26/2022)   PRAPARE - Administrator, Civil Service (Medical): No    Lack of Transportation (Non-Medical): No  Physical Activity: Insufficiently Active (11/26/2022)   Exercise Vital Sign    Days of Exercise per Week: 1 day    Minutes of Exercise per Session: 20 min  Stress: No Stress Concern Present (11/26/2022)   [employer] of Occupational Health - Occupational Stress Questionnaire    Feeling of Stress : Not at all  Social Connections: Socially Isolated (11/26/2022)   Social Connection and Isolation Panel    Frequency of Communication with Friends and Family: Once a week    Frequency of Social Gatherings with Friends and Family: Once a week    Attends Religious Services: More than 4 times per year    Active Member of [employer] or Organizations: No    Attends Banker Meetings: Never    Marital Status: Divorced    Review of Systems: Gen: Denies fever, chills, anorexia. Denies fatigue, weakness, weight loss.  CV: Denies chest pain, palpitations, syncope, peripheral edema, and claudication. Resp: Denies dyspnea at rest, cough, wheezing, coughing up blood, and pleurisy. GI: Denies vomiting blood, jaundice, and fecal incontinence.   Denies dysphagia or odynophagia. Derm: Denies rash, itching, dry skin Psych: Denies depression, anxiety, memory loss, confusion. No homicidal or suicidal ideation.  Heme: Denies bruising, bleeding, and enlarged lymph nodes.  Physical Exam: There were no vitals taken for this  visit. General:   Alert and oriented. No distress noted. Pleasant and cooperative.  Head:  Normocephalic and atraumatic. Eyes:  Conjuctiva clear without scleral icterus. Heart:  S1, S2 present without murmurs appreciated. Lungs:  Clear to auscultation bilaterally. No wheezes, rales, or rhonchi. No distress.  Abdomen:  +BS, soft, non-tender and non-distended. No rebound or guarding. No HSM or masses noted. Msk:  Symmetrical without gross deformities. Normal posture. Extremities:  Without edema. Neurologic:  Alert and  oriented x4 Psych:  Normal mood and affect.    Assessment:     Plan:  ***   Josette Centers, PA-C [facility] 08/27/2023

## 2023-08-27 ENCOUNTER — Ambulatory Visit (INDEPENDENT_AMBULATORY_CARE_PROVIDER_SITE_OTHER): Admitting: Gastroenterology

## 2023-08-27 ENCOUNTER — Encounter: Payer: Self-pay | Admitting: Gastroenterology

## 2023-08-27 VITALS — BP 117/78 | HR 68 | Temp 97.7°F | Ht 62.0 in | Wt 187.2 lb

## 2023-08-27 DIAGNOSIS — R198 Other specified symptoms and signs involving the digestive system and abdomen: Secondary | ICD-10-CM

## 2023-08-27 DIAGNOSIS — Z09 Encounter for follow-up examination after completed treatment for conditions other than malignant neoplasm: Secondary | ICD-10-CM

## 2023-08-27 DIAGNOSIS — R110 Nausea: Secondary | ICD-10-CM

## 2023-08-27 NOTE — Patient Instructions (Signed)
 We will follow-up with you as needed.   I am glad you are feeling much better!   Josette Centers, PA-C [facility]

## 2023-09-01 ENCOUNTER — Other Ambulatory Visit: Payer: Self-pay | Admitting: Family Medicine

## 2023-09-01 DIAGNOSIS — E1165 Type 2 diabetes mellitus with hyperglycemia: Secondary | ICD-10-CM

## 2023-09-05 ENCOUNTER — Other Ambulatory Visit: Payer: Self-pay

## 2023-09-05 DIAGNOSIS — E1165 Type 2 diabetes mellitus with hyperglycemia: Secondary | ICD-10-CM

## 2023-09-07 ENCOUNTER — Other Ambulatory Visit: Payer: Self-pay | Admitting: Family Medicine

## 2023-09-07 DIAGNOSIS — E1169 Type 2 diabetes mellitus with other specified complication: Secondary | ICD-10-CM

## 2023-11-28 ENCOUNTER — Other Ambulatory Visit: Payer: Self-pay

## 2023-11-28 DIAGNOSIS — E1165 Type 2 diabetes mellitus with hyperglycemia: Secondary | ICD-10-CM

## 2023-11-30 ENCOUNTER — Ambulatory Visit: Payer: Self-pay | Admitting: Family Medicine

## 2023-12-05 LAB — OPHTHALMOLOGY REPORT-SCANNED

## 2024-02-19 ENCOUNTER — Other Ambulatory Visit: Payer: Self-pay | Admitting: Family Medicine

## 2024-02-19 DIAGNOSIS — E1165 Type 2 diabetes mellitus with hyperglycemia: Secondary | ICD-10-CM

## 2024-02-22 ENCOUNTER — Other Ambulatory Visit: Payer: Self-pay | Admitting: Family Medicine

## 2024-02-22 DIAGNOSIS — E1165 Type 2 diabetes mellitus with hyperglycemia: Secondary | ICD-10-CM

## 2024-02-22 MED ORDER — TIRZEPATIDE 15 MG/0.5ML ~~LOC~~ SOAJ: 15.0000 mg | SUBCUTANEOUS | 0 refills | Status: DC

## 2024-02-22 NOTE — Telephone Encounter (Signed)
 Rx sent.

## 2024-03-01 DIAGNOSIS — E669 Obesity, unspecified: Secondary | ICD-10-CM | POA: Insufficient documentation

## 2024-03-01 NOTE — Progress Notes (Unsigned)
 "  Subjective:    Patient ID: Jessica Carey, female    DOB: 04/12/1985, 39 y.o.   MRN: [id number]   Chief Complaint: medical management of chronic issues     HPI:  Jessica Carey is a 40 y.o. who identifies as a female who was assigned female at birth.   Social history: Lives with: daughter Work history: [employer]   Comes in today for follow up of the following chronic medical issues:  1. Hyperlipidemia associated with type 2 diabetes mellitus (HCC) Does not watch diet Does occasional exercise Is on crestor  daily Lab Results  Component Value Date   CHOL 98 (L) 07/31/2023   HDL 35 (L) 07/31/2023   LDLCALC 47 07/31/2023   TRIG 76 07/31/2023   CHOLHDL 2.8 07/31/2023     2. Uncontrolled diabetes mellitus with hyperglycemia, without long-term current use of insulin  (HCC) Blood sugars at home are running around 110-140. She is currently on amaryl  and mounjario Lab Results  Component Value Date   HGBA1C 6.7 (H) 06/23/2023     3. Paroxysmal atrial fibrillation (HCC) Denies palpitations or heart racing On no blood thinner   4. Gastroesophageal reflux disease without esophagitis Denies any recent flare up  5. Vitamin D  deficiency Has not been taking a vitamin d  supplement Last vitamin D  Lab Results  Component Value Date   VD25OH 44.7 07/31/2023     6. Obesity (BMI 30-39.9) Weight is unchanged Wt Readings from Last 3 Encounters:  03/02/24 187 lb (84.8 kg)  08/27/23 187 lb 3.2 oz (84.9 kg)  07/31/23 183 lb (83 kg)   BMI Readings from Last 3 Encounters:  03/02/24 32.10 kg/m  08/27/23 34.24 kg/m  07/31/23 31.41 kg/m      New complaints: None today  Allergies[1] Outpatient Encounter Medications as of 03/02/2024  Medication Sig   blood glucose meter kit and supplies Dispense based on patient and insurance preference. Use up to four times daily as directed. (FOR ICD-10 E10.9, E11.9).   Cholecalciferol (VITAMIN D3) 25 MCG (1000 UT) CAPS Take 1 capsule (1,000  Units total) by mouth daily. (Patient not taking: Reported on 08/27/2023)   glimepiride  (AMARYL ) 4 MG tablet Take 1 tablet (4 mg total) by mouth daily before breakfast.   glucose blood (ONETOUCH VERIO) test strip USE AS DIRECTED 4 TIMES DAILY TO CHECK GLUCOSE   Insulin  Pen Needle (BD PEN NEEDLE NANO U/F) 32G X 4 MM MISC Use with Ozempic    Lancets (ONETOUCH DELICA PLUS LANCET33G) MISC as directed   Omega 3 1000 MG CAPS Take 1 daily   rosuvastatin  (CRESTOR ) 10 MG tablet Take 1 tablet (10 mg total) by mouth daily.   tirzepatide  (MOUNJARO ) 15 MG/0.5ML Pen Inject 15 mg into the skin once a week.   [DISCONTINUED] MOUNJARO  12.5 MG/0.5ML Pen Inject 12.5 mg into the skin once a week.   No facility-administered encounter medications on file as of 03/02/2024.    Past Surgical History:  Procedure Laterality Date   BREAST SURGERY Right    breast abcess   ELECTROPHYSIOLOGY STUDY N/A 03/22/2018   Procedure: ELECTROPHYSIOLOGY STUDY;  Surgeon: Waddell Danelle ORN, MD;  Location: [facility];  Service: Cardiovascular;  Laterality: N/A;   PERINEAL LACERATION REPAIR N/A 03/17/2018   Procedure: SUTURE REPAIR PERINEAL LACERATION;  Surgeon: Herchel Gloris LABOR, MD;  Location: [facility];  Service: Gynecology;  Laterality: N/A;   SURGERY OF LIP     SVT ABLATION N/A 03/22/2018   Procedure: SVT ABLATION;  Surgeon: Waddell Danelle ORN, MD;  Location: [facility];  Service: Cardiovascular;  Laterality: N/A;    Family History  Problem Relation Age of Onset   Diabetes Mother    Diabetes Father    Hyperlipidemia Father    Colon polyps Father 29   Diabetes Sister    Diabetes Paternal Grandmother    Cancer Maternal Aunt        liver      Controlled substance contract: n/a      Review of Systems  Constitutional:  Negative for diaphoresis.  Eyes:  Negative for pain.  Respiratory:  Negative for shortness of breath.   Cardiovascular:  Negative for chest pain, palpitations and leg swelling.   Gastrointestinal:  Negative for abdominal pain.  Endocrine: Negative for polydipsia.  Skin:  Negative for rash.  Neurological:  Negative for dizziness, weakness and headaches.  Hematological:  Does not bruise/bleed easily.  All other systems reviewed and are negative.      Objective:   Physical Exam Vitals and nursing note reviewed.  Constitutional:      General: She is not in acute distress.    Appearance: Normal appearance. She is well-developed.  HENT:     Head: Normocephalic.     Right Ear: Tympanic membrane normal.     Left Ear: Tympanic membrane normal.     Nose: Nose normal.     Mouth/Throat:     Mouth: Mucous membranes are moist.  Eyes:     Pupils: Pupils are equal, round, and reactive to light.  Neck:     Vascular: No carotid bruit or JVD.  Cardiovascular:     Rate and Rhythm: Normal rate and regular rhythm.     Heart sounds: Normal heart sounds.  Pulmonary:     Effort: Pulmonary effort is normal. No respiratory distress.     Breath sounds: Normal breath sounds. No wheezing or rales.  Chest:     Chest wall: No tenderness.  Abdominal:     General: Bowel sounds are normal. There is no distension or abdominal bruit.     Palpations: Abdomen is soft. There is no hepatomegaly, splenomegaly, mass or pulsatile mass.     Tenderness: There is no abdominal tenderness.  Musculoskeletal:        General: Normal range of motion.     Cervical back: Normal range of motion and neck supple.  Lymphadenopathy:     Cervical: No cervical adenopathy.  Skin:    General: Skin is warm and dry.  Neurological:     Mental Status: She is alert and oriented to person, place, and time.     Deep Tendon Reflexes: Reflexes are normal and symmetric.  Psychiatric:        Behavior: Behavior normal.        Thought Content: Thought content normal.        Judgment: Judgment normal.     BP 108/74   Pulse 80   Ht 5' 4 (1.626 m)   Wt 187 lb (84.8 kg)   SpO2 96%   BMI 32.10 kg/m         Assessment & Plan:   [name] comes in today with chief complaint of Medical Management of Chronic Issues (Four month follow up )   Diagnosis and orders addressed:  1. Hyperlipidemia associated with type 2 diabetes mellitus (HCC) (Primary) Low fat diet - rosuvastatin  (CRESTOR ) 10 MG tablet; Take 1 tablet (10 mg total) by mouth daily.  Dispense: 90 tablet; Refill: 1 - CBC with Differential/Platelet - Lipid  panel  2. Type 2 diabetes mellitus with other specified complication, with long-term current use of insulin  (HCC) Continue to watch carbs in diet - glimepiride  (AMARYL ) 4 MG tablet; Take 1 tablet (4 mg total) by mouth daily before breakfast.  Dispense: 90 tablet; Refill: 1 - tirzepatide  (MOUNJARO ) 15 MG/0.5ML Pen; Inject 15 mg into the skin once a week.  Dispense: 6 mL; Refill: 1 - Bayer DCA Hb A1c Waived - CMP14+EGFR  3. Paroxysmal atrial fibrillation (HCC) Avoid caffeine   4. Gastroesophageal reflux disease without esophagitis Avoid spicy foods Do not eat 2 hours prior to bedtime   5. Vitamin D  deficiency Labs pending - VITAMIN D  25 Hydroxy (Vit-D Deficiency, Fractures)  6. Obesity (BMI 30-39.9) Discussed diet and exercise for person with BMI >25 Will recheck weight in 3-6 months     Labs pending Health Maintenance reviewed Diet and exercise encouraged  Follow up plan: 6 months   Mary-Margaret Gladis, FNP     [1] No Known Allergies  "

## 2024-03-02 ENCOUNTER — Encounter: Payer: Self-pay | Admitting: Family Medicine

## 2024-03-02 ENCOUNTER — Encounter: Payer: Self-pay | Admitting: Nurse Practitioner

## 2024-03-02 ENCOUNTER — Ambulatory Visit: Admitting: Nurse Practitioner

## 2024-03-02 VITALS — BP 108/74 | HR 80 | Ht 64.0 in | Wt 187.0 lb

## 2024-03-02 DIAGNOSIS — Z794 Long term (current) use of insulin: Secondary | ICD-10-CM | POA: Diagnosis not present

## 2024-03-02 DIAGNOSIS — K219 Gastro-esophageal reflux disease without esophagitis: Secondary | ICD-10-CM

## 2024-03-02 DIAGNOSIS — E1169 Type 2 diabetes mellitus with other specified complication: Secondary | ICD-10-CM

## 2024-03-02 DIAGNOSIS — E1165 Type 2 diabetes mellitus with hyperglycemia: Secondary | ICD-10-CM

## 2024-03-02 DIAGNOSIS — E559 Vitamin D deficiency, unspecified: Secondary | ICD-10-CM | POA: Diagnosis not present

## 2024-03-02 DIAGNOSIS — E785 Hyperlipidemia, unspecified: Secondary | ICD-10-CM

## 2024-03-02 DIAGNOSIS — I480 Paroxysmal atrial fibrillation: Secondary | ICD-10-CM | POA: Diagnosis not present

## 2024-03-02 DIAGNOSIS — E669 Obesity, unspecified: Secondary | ICD-10-CM | POA: Diagnosis not present

## 2024-03-02 MED ORDER — ROSUVASTATIN CALCIUM 10 MG PO TABS: 10.0000 mg | ORAL_TABLET | Freq: Every day | ORAL | 1 refills | Status: AC

## 2024-03-02 MED ORDER — GLIMEPIRIDE 4 MG PO TABS: 4.0000 mg | ORAL_TABLET | Freq: Every day | ORAL | 1 refills | Status: AC

## 2024-03-02 MED ORDER — TIRZEPATIDE 15 MG/0.5ML ~~LOC~~ SOAJ: 15.0000 mg | SUBCUTANEOUS | 1 refills | Status: AC

## 2024-03-02 NOTE — Patient Instructions (Signed)

## 2024-03-03 ENCOUNTER — Ambulatory Visit: Payer: Self-pay | Admitting: Nurse Practitioner

## 2024-03-03 LAB — CMP14+EGFR
ALT: 14 [IU]/L (ref 0–32)
AST: 13 [IU]/L (ref 0–40)
Albumin: 4.2 g/dL (ref 3.9–4.9)
Alkaline Phosphatase: 70 [IU]/L (ref 41–116)
BUN/Creatinine Ratio: 20 (ref 9–23)
BUN: 18 mg/dL (ref 6–20)
Bilirubin Total: 0.8 mg/dL (ref 0.0–1.2)
CO2: 22 mmol/L (ref 20–29)
Calcium: 9.1 mg/dL (ref 8.7–10.2)
Chloride: 100 mmol/L (ref 96–106)
Creatinine, Ser: 0.89 mg/dL (ref 0.57–1.00)
Globulin, Total: 3.7 g/dL (ref 1.5–4.5)
Glucose: 103 mg/dL — ABNORMAL HIGH (ref 70–99)
Potassium: 4.5 mmol/L (ref 3.5–5.2)
Sodium: 136 mmol/L (ref 134–144)
Total Protein: 7.9 g/dL (ref 6.0–8.5)
eGFR: 86 mL/min/{1.73_m2}

## 2024-03-03 LAB — VITAMIN D 25 HYDROXY (VIT D DEFICIENCY, FRACTURES): Vit D, 25-Hydroxy: 29.6 ng/mL — ABNORMAL LOW (ref 30.0–100.0)

## 2024-03-03 LAB — CBC WITH DIFFERENTIAL/PLATELET
Basophils Absolute: 0 10*3/uL (ref 0.0–0.2)
Basos: 0 %
EOS (ABSOLUTE): 0.1 10*3/uL (ref 0.0–0.4)
Eos: 1 %
Hematocrit: 43.3 % (ref 34.0–46.6)
Hemoglobin: 14.1 g/dL (ref 11.1–15.9)
Immature Grans (Abs): 0 10*3/uL (ref 0.0–0.1)
Immature Granulocytes: 0 %
Lymphocytes Absolute: 3 10*3/uL (ref 0.7–3.1)
Lymphs: 28 %
MCH: 27.8 pg (ref 26.6–33.0)
MCHC: 32.6 g/dL (ref 31.5–35.7)
MCV: 85 fL (ref 79–97)
Monocytes Absolute: 0.4 10*3/uL (ref 0.1–0.9)
Monocytes: 4 %
Neutrophils Absolute: 7 10*3/uL (ref 1.4–7.0)
Neutrophils: 67 %
Platelets: 234 10*3/uL (ref 150–450)
RBC: 5.08 x10E6/uL (ref 3.77–5.28)
RDW: 13.3 % (ref 11.7–15.4)
WBC: 10.5 10*3/uL (ref 3.4–10.8)

## 2024-03-03 LAB — LIPID PANEL
Chol/HDL Ratio: 3 ratio (ref 0.0–4.4)
Cholesterol, Total: 100 mg/dL (ref 100–199)
HDL: 33 mg/dL — ABNORMAL LOW
LDL Chol Calc (NIH): 51 mg/dL (ref 0–99)
Triglycerides: 77 mg/dL (ref 0–149)
VLDL Cholesterol Cal: 16 mg/dL (ref 5–40)

## 2024-03-04 ENCOUNTER — Emergency Department (HOSPITAL_COMMUNITY)

## 2024-03-04 ENCOUNTER — Observation Stay (HOSPITAL_COMMUNITY)

## 2024-03-04 ENCOUNTER — Observation Stay (HOSPITAL_COMMUNITY)
Admission: EM | Admit: 2024-03-04 | Discharge: 2024-03-05 | Disposition: A | Attending: Emergency Medicine | Admitting: Emergency Medicine

## 2024-03-04 ENCOUNTER — Other Ambulatory Visit: Payer: Self-pay

## 2024-03-04 ENCOUNTER — Encounter (HOSPITAL_COMMUNITY): Payer: Self-pay

## 2024-03-04 DIAGNOSIS — I4891 Unspecified atrial fibrillation: Secondary | ICD-10-CM | POA: Insufficient documentation

## 2024-03-04 DIAGNOSIS — Z7984 Long term (current) use of oral hypoglycemic drugs: Secondary | ICD-10-CM | POA: Insufficient documentation

## 2024-03-04 DIAGNOSIS — Z794 Long term (current) use of insulin: Secondary | ICD-10-CM | POA: Insufficient documentation

## 2024-03-04 DIAGNOSIS — E119 Type 2 diabetes mellitus without complications: Secondary | ICD-10-CM | POA: Insufficient documentation

## 2024-03-04 DIAGNOSIS — Z79899 Other long term (current) drug therapy: Secondary | ICD-10-CM | POA: Diagnosis not present

## 2024-03-04 DIAGNOSIS — R200 Anesthesia of skin: Secondary | ICD-10-CM | POA: Diagnosis present

## 2024-03-04 DIAGNOSIS — Z6832 Body mass index (BMI) 32.0-32.9, adult: Secondary | ICD-10-CM | POA: Diagnosis not present

## 2024-03-04 DIAGNOSIS — G459 Transient cerebral ischemic attack, unspecified: Secondary | ICD-10-CM | POA: Diagnosis not present

## 2024-03-04 DIAGNOSIS — E785 Hyperlipidemia, unspecified: Secondary | ICD-10-CM | POA: Diagnosis not present

## 2024-03-04 DIAGNOSIS — I480 Paroxysmal atrial fibrillation: Secondary | ICD-10-CM

## 2024-03-04 DIAGNOSIS — I6389 Other cerebral infarction: Principal | ICD-10-CM | POA: Insufficient documentation

## 2024-03-04 DIAGNOSIS — I639 Cerebral infarction, unspecified: Secondary | ICD-10-CM

## 2024-03-04 DIAGNOSIS — Z8679 Personal history of other diseases of the circulatory system: Secondary | ICD-10-CM | POA: Diagnosis not present

## 2024-03-04 DIAGNOSIS — E1165 Type 2 diabetes mellitus with hyperglycemia: Secondary | ICD-10-CM

## 2024-03-04 LAB — CBC
HCT: 39.6 % (ref 36.0–46.0)
Hemoglobin: 12.9 g/dL (ref 12.0–15.0)
MCH: 27.6 pg (ref 26.0–34.0)
MCHC: 32.6 g/dL (ref 30.0–36.0)
MCV: 84.8 fL (ref 80.0–100.0)
Platelets: 199 10*3/uL (ref 150–400)
RBC: 4.67 MIL/uL (ref 3.87–5.11)
RDW: 13.5 % (ref 11.5–15.5)
WBC: 11.3 10*3/uL — ABNORMAL HIGH (ref 4.0–10.5)
nRBC: 0 % (ref 0.0–0.2)

## 2024-03-04 LAB — I-STAT CHEM 8, ED
BUN: 15 mg/dL (ref 6–20)
Calcium, Ion: 1.17 mmol/L (ref 1.15–1.40)
Chloride: 101 mmol/L (ref 98–111)
Creatinine, Ser: 0.8 mg/dL (ref 0.44–1.00)
Glucose, Bld: 164 mg/dL — ABNORMAL HIGH (ref 70–99)
HCT: 41 % (ref 36.0–46.0)
Hemoglobin: 13.9 g/dL (ref 12.0–15.0)
Potassium: 3.8 mmol/L (ref 3.5–5.1)
Sodium: 140 mmol/L (ref 135–145)
TCO2: 27 mmol/L (ref 22–32)

## 2024-03-04 LAB — DIFFERENTIAL
Abs Immature Granulocytes: 0.04 10*3/uL (ref 0.00–0.07)
Basophils Absolute: 0 10*3/uL (ref 0.0–0.1)
Basophils Relative: 0 %
Eosinophils Absolute: 0.1 10*3/uL (ref 0.0–0.5)
Eosinophils Relative: 1 %
Immature Granulocytes: 0 %
Lymphocytes Relative: 23 %
Lymphs Abs: 2.6 10*3/uL (ref 0.7–4.0)
Monocytes Absolute: 0.6 10*3/uL (ref 0.1–1.0)
Monocytes Relative: 5 %
Neutro Abs: 7.9 10*3/uL — ABNORMAL HIGH (ref 1.7–7.7)
Neutrophils Relative %: 71 %

## 2024-03-04 LAB — COMPREHENSIVE METABOLIC PANEL WITH GFR
ALT: 15 U/L (ref 0–44)
AST: 22 U/L (ref 15–41)
Albumin: 4.2 g/dL (ref 3.5–5.0)
Alkaline Phosphatase: 77 U/L (ref 38–126)
Anion gap: 8 (ref 5–15)
BUN: 15 mg/dL (ref 6–20)
CO2: 28 mmol/L (ref 22–32)
Calcium: 9 mg/dL (ref 8.9–10.3)
Chloride: 102 mmol/L (ref 98–111)
Creatinine, Ser: 0.79 mg/dL (ref 0.44–1.00)
GFR, Estimated: >60 mL/min
Glucose, Bld: 195 mg/dL — ABNORMAL HIGH (ref 70–99)
Potassium: 3.9 mmol/L (ref 3.5–5.1)
Sodium: 138 mmol/L (ref 135–145)
Total Bilirubin: 0.5 mg/dL (ref 0.0–1.2)
Total Protein: 8 g/dL (ref 6.5–8.1)

## 2024-03-04 LAB — PROTIME-INR
INR: 1 (ref 0.8–1.2)
Prothrombin Time: 13.9 s (ref 11.4–15.2)

## 2024-03-04 LAB — CBG MONITORING, ED: Glucose-Capillary: 160 mg/dL — ABNORMAL HIGH (ref 70–99)

## 2024-03-04 LAB — APTT: aPTT: 26 s (ref 24–36)

## 2024-03-04 LAB — ETHANOL: Alcohol, Ethyl (B): <15 mg/dL

## 2024-03-04 LAB — HCG, SERUM, QUALITATIVE: Preg, Serum: NEGATIVE

## 2024-03-04 MED ORDER — ACETAMINOPHEN 650 MG RE SUPP: 650.0000 mg | RECTAL | Status: DC | PRN

## 2024-03-04 MED ORDER — HEPARIN SODIUM (PORCINE) 5000 UNIT/ML IJ SOLN
5000.0000 [IU] | Freq: Three times a day (TID) | INTRAMUSCULAR | Status: DC
  Filled 2024-03-04: qty 1

## 2024-03-04 MED ORDER — ACETAMINOPHEN 160 MG/5ML PO SOLN: 650.0000 mg | ORAL | Status: DC | PRN

## 2024-03-04 MED ORDER — ROSUVASTATIN CALCIUM 5 MG PO TABS
10.0000 mg | ORAL_TABLET | Freq: Every day | ORAL | Status: DC
  Administered 2024-03-05: 10 mg via ORAL
  Filled 2024-03-04: qty 2

## 2024-03-04 MED ORDER — ACETAMINOPHEN 325 MG PO TABS: 650.0000 mg | ORAL_TABLET | ORAL | Status: DC | PRN

## 2024-03-04 MED ORDER — STROKE: EARLY STAGES OF RECOVERY BOOK
Freq: Once | Status: AC
  Filled 2024-03-04: qty 1

## 2024-03-04 NOTE — Consult Note (Signed)
 NEUROLOGY CONSULT NOTE   Date of service: March 04, 2024 Patient Name: Jessica Carey MRN:  [id number] DOB:  [birth year] Chief Complaint: episode of R facial numbness/tongue numbness, dysequilibrium that lasted several mins Requesting Provider: Gershon Lash, MD  History of Present Illness  Jessica Carey is a 39 y.o. female with hx of DM2, HLD, ?afibb vs SVT who in the evening today, [place], when she had sudden onset R facial numbness, slurred speech, tongue numbness and dysequilibrium. Symptoms lasted about 20 mins and then resolved. She does feel like the symptoms triggered a panic attack.  She endorses hx of afibb, not on AC. Reports being diagnosed in February 2020 when she gave birth and her heart rate was significantly elevated during labor.  She denies any prior history of strokes, there is no family history of strokes.  She does not smoke, does not drink alcohol, does not use any recreational substances.  She endorses a history of diabetes as well as hyperlipidemia.  She is not sure if she has hypertension.  She reports that she is not on any medications at home.  LKW: 1850 on 03/04/2024 Modified rankin score: 0-Completely asymptomatic and back to baseline post- stroke IV Thrombolysis: Not offered due to spontaneous and full resolution of her symptoms.   EVT: Not offered, due to spontaneous and full resolution of her symptoms and that her symptoms were too mild to treat   NIHSS components Score: Comment  1a Level of Conscious 0[]  1[]  2[]  3[]      1b LOC Questions 0[]  1[]  2[]       1c LOC Commands 0[]  1[]  2[]       2 Best Gaze 0[]  1[]  2[]       3 Visual 0[]  1[]  2[]  3[]      4 Facial Palsy 0[]  1[]  2[]  3[]      5a Motor Arm - left 0[]  1[]  2[]  3[]  4[]  UN[]    5b Motor Arm - Right 0[]  1[]  2[]  3[]  4[]  UN[]    6a Motor Leg - Left 0[]  1[]  2[]  3[]  4[]  UN[]    6b Motor Leg - Right 0[]  1[]  2[]  3[]  4[]  UN[]    7 Limb Ataxia 0[]  1[]  2[]  UN[]      8 Sensory 0[]  1[]   2[]  UN[]      9 Best Language 0[]  1[]  2[]  3[]      10 Dysarthria 0[]  1[]  2[]  UN[]      11 Extinct. and Inattention 0[]  1[]  2[]       TOTAL: 0      ROS  Comprehensive ROS performed and pertinent positives documented in HPI   Past History   Past Medical History:  Diagnosis Date   ASCUS of cervix with negative high risk HPV 12/24/2020   12/24/20 repeat pap in 3 years per [facility],5 year risk CIN3+ is 0.40%   Boil, breast 07/04/2013   Diabetes (HCC) 08/04/2013   Diabetes mellitus without complication (HCC)    glyburide  and insulin    Dysrhythmia    a-fib   Hyperlipidemia    Irregular periods     Past Surgical History:  Procedure Laterality Date   BREAST SURGERY Right    breast abcess   ELECTROPHYSIOLOGY STUDY N/A 03/22/2018   Procedure: ELECTROPHYSIOLOGY STUDY;  Surgeon: Waddell Danelle ORN, MD;  Location: [facility];  Service: Cardiovascular;  Laterality: N/A;   PERINEAL LACERATION REPAIR N/A 03/17/2018   Procedure: SUTURE REPAIR PERINEAL LACERATION;  Surgeon: Herchel Gloris LABOR, MD;  Location: [facility];  Service:  Gynecology;  Laterality: N/A;   SURGERY OF LIP     SVT ABLATION N/A 03/22/2018   Procedure: SVT ABLATION;  Surgeon: Waddell Danelle ORN, MD;  Location: [facility];  Service: Cardiovascular;  Laterality: N/A;    Family History: Family History  Problem Relation Age of Onset   Diabetes Mother    Diabetes Father    Hyperlipidemia Father    Colon polyps Father 44   Diabetes Sister    Diabetes Paternal Grandmother    Cancer Maternal Aunt        liver    Social History  reports that she has never smoked. She has never used smokeless tobacco. She reports that she does not currently use alcohol. She reports that she does not use drugs.  Allergies[1]  Medications  Current Medications[2]  Vitals   Vitals:   03/04/24 2025 03/04/24 2130 03/04/24 2250  BP: 135/89  119/85  Pulse: 91  78  Resp: 17  20  Temp: 98.5 F (36.9 C)    SpO2: 100%  100%   Weight:  84.8 kg   Height:  5' 4 (1.626 m)     Body mass index is 32.09 kg/m.   Physical Exam   General: Laying comfortably in bed; in no acute distress.  HENT: Normal oropharynx and mucosa. Normal external appearance of ears and nose.  Neck: Supple, no pain or tenderness  CV: No JVD. No peripheral edema.  Pulmonary: Symmetric Chest rise. Normal respiratory effort.  Abdomen: Soft to touch, non-tender.  Ext: No cyanosis, edema, or deformity  Skin: No rash. Normal palpation of skin.   Musculoskeletal: Normal digits and nails by inspection. No clubbing.   Neurologic Examination  Mental status/Cognition: Alert, oriented to self, place, month and year, good attention.  Speech/language: Fluent, comprehension intact, object naming intact, repetition intact.  Cranial nerves:   CN II Pupils equal and reactive to light, no VF deficits    CN III,IV,VI EOM intact, no gaze preference or deviation, no nystagmus    CN V normal sensation in V1, V2, and V3 segments bilaterally    CN VII no asymmetry, no nasolabial fold flattening    CN VIII normal hearing to speech    CN IX & X normal palatal elevation, no uvular deviation    CN XI 5/5 head turn and 5/5 shoulder shrug bilaterally    CN XII midline tongue protrusion    Motor:  Muscle bulk: Normal, tone normal, pronator drift none, tremor none Mvmt Root Nerve  Muscle Right Left Comments  SA C5/6 Ax Deltoid 5 5   EF C5/6 Mc Biceps 5 5   EE C6/7/8 Rad Triceps 5 5   WF C6/7 Med FCR     WE C7/8 PIN ECU     F Ab C8/T1 U ADM/FDI 5 5   HF L1/2/3 Fem Illopsoas 5 5   KE L2/3/4 Fem Quad 5 5   DF L4/5 D Peron Tib Ant 5 5   PF S1/2 Tibial Grc/Sol 5 5    Sensation:  Light touch Intact throughout   Pin prick    Temperature    Vibration   Proprioception    Coordination/Complex Motor:  - Finger to Nose intact bilaterally - Heel to shin intact bilaterally - Rapid alternating movement are normal - Gait: Deferred gait at this time for patient  safety. Labs/Imaging/Neurodiagnostic studies   CBC:  Recent Labs  Lab 2024/03/23 1046 03/04/24 2020 03/04/24 2039  WBC 10.5 11.3*  --  NEUTROABS 7.0 7.9*  --   HGB 14.1 12.9 13.9  HCT 43.3 39.6 41.0  MCV 85 84.8  --   PLT 234 199  --    Basic Metabolic Panel:  Lab Results  Component Value Date   NA 140 03/04/2024   K 3.8 03/04/2024   CO2 28 03/04/2024   GLUCOSE 164 (H) 03/04/2024   BUN 15 03/04/2024   CREATININE 0.80 03/04/2024   CALCIUM  9.0 03/04/2024   GFRNONAA >60 03/04/2024   GFRAA >60 03/22/2018   Lipid Panel:  Lab Results  Component Value Date   LDLCALC 51 03/02/2024   HgbA1c:  Lab Results  Component Value Date   HGBA1C 6.7 (H) 06/23/2023   Urine Drug Screen: No results found for: LABOPIA, COCAINSCRNUR, LABBENZ, AMPHETMU, THCU, LABBARB  Alcohol Level     Component Value Date/Time   [facility] <15 03/04/2024 2020   INR  Lab Results  Component Value Date   INR 1.0 03/04/2024   APTT  Lab Results  Component Value Date   APTT 26 03/04/2024   AED levels: No results found for: PHENYTOIN, ZONISAMIDE, LAMOTRIGINE, LEVETIRACETA  CT Head without contrast(Personally reviewed): CTH was negative for a large hypodensity concerning for a large territory infarct or hyperdensity concerning for an ICH  CT angio Head and Neck with contrast(Personally reviewed): Pending  MRI Brain(Personally reviewed): Pending ASSESSMENT   Jessica Carey is a 39 y.o. female with hx of DM2, HLD, afib not on AC who presents with 20-minute episode of left face, left tongue numbness along with sensation of disequilibrium that lasted about 20 minutes.  She does have significant stroke risk factors including diabetes, hyperlipidemia, history of atrial fibrillation and not on anticoagulation.  I do suspect that the episode seems concerning for a TIA.  Patient endorses that she did feel panicked during this but is very clear that the panic was a result of these symptoms rather  than the other way around.  With this episode of TIA, her CHA2DS2-VASc score warrants anticoagulation.  RECOMMENDATIONS  - Frequent Neuro checks per stroke unit protocol - Recommend brain imaging with MRI Brain without contrast - Recommend Vascular imaging with CT angio of the head and neck. - Recommend obtaining TTE - Recommend obtaining Lipid panel with LDL - Please start statin if LDL > 70 - Recommend HbA1c to evaluate for diabetes and how well it is controlled. - Antithrombotic -aspirin  81 mg daily for now.  We will obtain MRI of the brain without contrast and depending on the size of a acute infarct, will determine date for transitioning to anticoagulation. - Recommend DVT ppx for now.  Please stop when starting anticoagulation. - SBP goal - permissive hypertension first 24 h < 220/110. Held home meds.  - Recommend Telemetry monitoring for arrythmia - Recommend bedside swallow screen prior to PO intake. - Stroke education booklet - Recommend PT/OT/SLP consult  ______________________________________________________________________  Plan was discussed with Dr. Freddi with the ED team.  Signed, Schyler Counsell, MD [facility]     [1] No Known Allergies [2]  Current Facility-Administered Medications:    [START ON 03/05/2024]  stroke: early stages of recovery book, , Does not apply, Once, Gershon Lash, MD   acetaminophen  (TYLENOL ) tablet 650 mg, 650 mg, Oral, Q4H PRN **OR** acetaminophen  (TYLENOL ) 160 MG/5ML solution 650 mg, 650 mg, Per Tube, Q4H PRN **OR** acetaminophen  (TYLENOL ) suppository 650 mg, 650 mg, Rectal, Q4H PRN, Gershon Lash, MD   [START ON 03/05/2024] heparin  injection 5,000 Units, 5,000 Units, Subcutaneous, Q8H,  Gershon Lash, MD   [START ON 03/05/2024] rosuvastatin  (CRESTOR ) tablet 10 mg, 10 mg, Oral, Daily, Gershon Lash, MD  Current Outpatient Medications:    glimepiride  (AMARYL ) 4 MG tablet, Take 1 tablet (4 mg total) by mouth daily before breakfast., Disp:  90 tablet, Rfl: 1   rosuvastatin  (CRESTOR ) 10 MG tablet, Take 1 tablet (10 mg total) by mouth daily., Disp: 90 tablet, Rfl: 1   tirzepatide  (MOUNJARO ) 15 MG/0.5ML Pen, Inject 15 mg into the skin once a week. (Patient taking differently: Inject 15 mg into the skin every Sunday.), Disp: 6 mL, Rfl: 1   blood glucose meter kit and supplies, Dispense based on patient and insurance preference. Use up to four times daily as directed. (FOR ICD-10 E10.9, E11.9)., Disp: 1 each, Rfl: 0   glucose blood (ONETOUCH VERIO) test strip, USE AS DIRECTED 4 TIMES DAILY TO CHECK GLUCOSE, Disp: 100 each, Rfl: 0   Insulin  Pen Needle (BD PEN NEEDLE NANO U/F) 32G X 4 MM MISC, Use with Ozempic , Disp: 100 each, Rfl: 5   Lancets (ONETOUCH DELICA PLUS LANCET33G) MISC, as directed, Disp: 100 each, Rfl: 0

## 2024-03-04 NOTE — ED Provider Triage Note (Signed)
 Emergency Medicine Provider Triage Evaluation Note  Oneal Biglow , a 39 y.o. female  was evaluated in triage.  Pt complains of sudden onset dizziness, trouble speaking, right-sided facial numbness.  Around 6:15 PM.  Brought in by EMS but now all of her symptoms have completely resolved.  Review of Systems  Positive: Tingling, dizziness, speech difficulty  Physical Exam  There were no vitals taken for this visit. Gen:   Awake, no distress Resp:  Normal effort MSK:   Moves extremities without difficulty Other:  No focal weakness, clear speech, no facial droop  Medical Decision Making  Medically screening exam initiated at 8:20 PM.  Appropriate orders placed.  Zea Kostka was informed that the remainder of the evaluation will be completed by another provider, this initial triage assessment does not replace that evaluation, and the importance of remaining in the ED until their evaluation is complete.  Patient is completely up symptomatic.  Possible TIA, but not a code stroke.  TIA workup has been started.  Discussed the importance with patient of alerting staff if she develops any recurrent symptoms.   Freddi Hamilton, MD 03/04/24 2021

## 2024-03-04 NOTE — H&P (Signed)
 " History and Physical    Jessica Carey FMW:[id number] DOB: 1985-05-10 DOA: 03/04/2024  PCP: Edman Meade PEDLAR, FNP  Patient coming from: Home  Chief Complaint: Facial numbness  HPI: Jessica Carey is a 39 y.o. female with hx of DM2, HLD, ?afibb vs SVT who in the evening today, [place] when she had sudden onset R facial numbness, slurred speech, tongue numbness and dysequilibrium. Symptoms lasted about 20 mins and then resolved. She does feel like the symptoms triggered a panic attack.   Endorses history of atrial fibrillation not on AC. Diagnosed during labor in February 2020. SVT ablation in 2020.   Notes intermittent on and off numbness and tlinging of hte right cheek and tongue.   No prior strokes, does not smoke, no chest pain, shortness of breath, muscle weakness. Fairly healthy otherwise.   ED Course: CTH without a bleed. Discussed with neurology who recommend admission for TIA. CTA and MRI brain ordered and pending.   Review of Systems: As per HPI otherwise all other systems reviewed and are negative.   Past Medical History:  Diagnosis Date   ASCUS of cervix with negative high risk HPV 12/24/2020   12/24/20 repeat pap in 3 years per [facility],5 year risk CIN3+ is 0.40%   Boil, breast 07/04/2013   Diabetes (HCC) 08/04/2013   Diabetes mellitus without complication (HCC)    glyburide  and insulin    Dysrhythmia    a-fib   Hyperlipidemia    Irregular periods     Past Surgical History:  Procedure Laterality Date   BREAST SURGERY Right    breast abcess   ELECTROPHYSIOLOGY STUDY N/A 03/22/2018   Procedure: ELECTROPHYSIOLOGY STUDY;  Surgeon: Waddell Danelle ORN, MD;  Location: [facility];  Service: Cardiovascular;  Laterality: N/A;   PERINEAL LACERATION REPAIR N/A 03/17/2018   Procedure: SUTURE REPAIR PERINEAL LACERATION;  Surgeon: Herchel Gloris LABOR, MD;  Location: [facility];  Service: Gynecology;  Laterality: N/A;   SURGERY OF LIP      SVT ABLATION N/A 03/22/2018   Procedure: SVT ABLATION;  Surgeon: Waddell Danelle ORN, MD;  Location: [facility];  Service: Cardiovascular;  Laterality: N/A;    Social History  reports that she has never smoked. She has never used smokeless tobacco. She reports that she does not currently use alcohol. She reports that she does not use drugs.  Allergies[1]  Family History  Problem Relation Age of Onset   Diabetes Mother    Diabetes Father    Hyperlipidemia Father    Colon polyps Father 52   Diabetes Sister    Diabetes Paternal Grandmother    Cancer Maternal Aunt        liver    Prior to Admission medications  Medication Sig Start Date End Date Taking? Authorizing Provider  glimepiride  (AMARYL ) 4 MG tablet Take 1 tablet (4 mg total) by mouth daily before breakfast. 03/02/24  Yes Gladis, Mary-Margaret, FNP  rosuvastatin  (CRESTOR ) 10 MG tablet Take 1 tablet (10 mg total) by mouth daily. 03/02/24  Yes Martin, Mary-Margaret, FNP  tirzepatide  (MOUNJARO ) 15 MG/0.5ML Pen Inject 15 mg into the skin once a week. Patient taking differently: Inject 15 mg into the skin every Sunday. 03/02/24  Yes Gladis, Mary-Margaret, FNP  blood glucose meter kit and supplies Dispense based on patient and insurance preference. Use up to four times daily as directed. (FOR ICD-10 E10.9, E11.9). 03/14/22   Bacchus, Meade PEDLAR, FNP  glucose blood (ONETOUCH VERIO)  test strip USE AS DIRECTED 4 TIMES DAILY TO CHECK GLUCOSE 09/01/23   Bacchus, Meade PEDLAR, FNP  Insulin  Pen Needle (BD PEN NEEDLE NANO U/F) 32G X 4 MM MISC Use with Ozempic  02/02/23   Bacchus, Meade PEDLAR, FNP  Lancets [facility] DELICA PLUS [place]) MISC as directed 02/02/23   Edman Meade PEDLAR, FNP    Physical Exam: Vitals:   03/04/24 2025 03/04/24 2130 03/04/24 2250  BP: 135/89  119/85  Pulse: 91  78  Resp: 17  20  Temp: 98.5 F (36.9 C)    SpO2: 100%  100%  Weight:  84.8 kg   Height:  5' 4 (1.626 m)     Constitutional: NAD, calm,  comfortable Vitals:   03/04/24 2025 03/04/24 2130 03/04/24 2250  BP: 135/89  119/85  Pulse: 91  78  Resp: 17  20  Temp: 98.5 F (36.9 C)    SpO2: 100%  100%  Weight:  84.8 kg   Height:  5' 4 (1.626 m)    Eyes: PERRL, lids and conjunctivae normal ENMT: Mucous membranes are moist. Posterior pharynx clear of any exudate or lesions.Normal dentition.  Neck: normal, supple, no masses, no thyromegaly Respiratory: clear to auscultation bilaterally, no wheezing, no crackles. Normal respiratory effort. No accessory muscle use.  Cardiovascular: Regular rate and rhythm, no murmurs / rubs / gallops. No extremity edema. 2+ pedal pulses. No carotid bruits.  Abdomen: no tenderness, no masses palpated. No hepatosplenomegaly. Bowel sounds positive.  Musculoskeletal: no clubbing / cyanosis. No joint deformity upper and lower extremities. Good ROM, no contractures. Normal muscle tone. 5/5 strength.  Skin: no rashes, lesions, ulcers. No induration Neurologic: CN 2-12 grossly intact. Sensation intact, DTR normal. Strength 5/5 in all 4.  Psychiatric: Normal judgment and insight. Alert and oriented x 3. Normal mood.  Labs on Admission: I have personally reviewed following labs and imaging studies  CBC: Recent Labs  Lab 03/02/24 1046 03/04/24 2020 03/04/24 2039  WBC 10.5 11.3*  --   NEUTROABS 7.0 7.9*  --   HGB 14.1 12.9 13.9  HCT 43.3 39.6 41.0  MCV 85 84.8  --   PLT 234 199  --     Basic Metabolic Panel: Recent Labs  Lab 03/02/24 1046 03/04/24 2020 03/04/24 2039  NA 136 138 140  K 4.5 3.9 3.8  CL 100 102 101  CO2 22 28  --   GLUCOSE 103* 195* 164*  BUN 18 15 15   CREATININE 0.89 0.79 0.80  CALCIUM  9.1 9.0  --     GFR: Estimated Creatinine Clearance: 101.4 mL/min (by C-G formula based on SCr of 0.8 mg/dL).  Liver Function Tests: Recent Labs  Lab 03/02/24 1046 03/04/24 2020  AST 13 22  ALT 14 15  ALKPHOS 70 77  BILITOT 0.8 0.5  PROT 7.9 8.0  ALBUMIN 4.2 4.2    Urine  analysis:    Component Value Date/Time   COLORURINE STRAW (A) 03/17/2018 2154   APPEARANCEUR CLEAR 03/17/2018 2154   APPEARANCEUR Clear 09/02/2017 1430   LABSPEC 1.024 03/17/2018 2154   PHURINE 6.0 03/17/2018 2154   GLUCOSEU NEGATIVE 03/17/2018 2154   HGBUR SMALL (A) 03/17/2018 2154   BILIRUBINUR negative 05/21/2022 0913   BILIRUBINUR Negative 09/02/2017 1430   KETONESUR negative 05/21/2022 0913   KETONESUR NEGATIVE 03/17/2018 2154   PROTEINUR NEGATIVE 03/17/2018 2154   UROBILINOGEN 0.2 05/21/2022 0913   NITRITE Negative 05/21/2022 0913   NITRITE NEGATIVE 03/17/2018 2154   LEUKOCYTESUR Negative 05/21/2022 0913   LEUKOCYTESUR NEGATIVE  03/17/2018 2154    Radiological Exams on Admission: CT HEAD WO CONTRAST Result Date: 03/04/2024 EXAM: CT HEAD WITHOUT CONTRAST 03/04/2024 08:42:00 PM TECHNIQUE: CT of the head was performed without the administration of intravenous contrast. Automated exposure control, iterative reconstruction, and/or weight based adjustment of the mA/kV was utilized to reduce the radiation dose to as low as reasonably achievable. COMPARISON: None available. CLINICAL HISTORY: Transient ischemic attack (TIA). FINDINGS: BRAIN AND VENTRICLES: No acute hemorrhage. No evidence of acute infarct. No hydrocephalus. No extra-axial collection. No mass effect or midline shift. ORBITS: No acute abnormality. SINUSES: No acute abnormality. SOFT TISSUES AND SKULL: No acute soft tissue abnormality. No skull fracture. IMPRESSION: 1. No acute intracranial abnormality. Electronically signed by: Norman Gatlin MD 03/04/2024 10:14 PM EST RP Workstation: [id number]    EKG was NSR.   Assessment/Plan Principal Problem:   TIA (transient ischemic attack)  #TIA Patient with sudden onset of right facial tongue and cheek numbness. Never had before. Comes and goes. CTH negative. Currently undergoing CTA and MRI brain with neurology following. No focal neurological deficits. No muscular weakness on  exam. Intermittent nature of her systems concerning for possible ongoing neurological event so will wait for results of CTA and MRI.  - Recommendations as per neurology.  - TTE - Lipid Panel - A1c - Aspirin  81 mg.  -  DVT prophylaxis:  Heparin  DVT Ppx Code Status:   Full  Family Communication:  Nil.  Disposition Plan:   Patient is from:  Home.   Anticipated DC to:  [facility]  Anticipated DC date:  03/07/2024  Anticipated DC barriers: Nil  Consults called:  Neurology, Khalidina(with names) Admission status:  Inpatient Severity of Illness: The appropriate patient status for this patient is OBSERVATION. Observation status is judged to be reasonable and necessary in order to provide the required intensity of service to ensure the patient's safety. The patient's presenting symptoms, physical exam findings, and initial radiographic and laboratory data in the context of their medical condition is felt to place them at decreased risk for further clinical deterioration. Furthermore, it is anticipated that the patient will be medically stable for discharge from the hospital within 2 midnights of admission.     Hildegard Fila MD [facility]  How to contact the [facility] Attending or Consulting provider 7A - 7P or covering provider during after hours 7P -7A, for this patient?   Check the care team in [facility] and look for a) attending/consulting [facility] provider listed and b) the [facility] team listed Log into [email or web address] and use [place]'s universal password to access. If you do not have the password, please contact the hospital operator. Locate the [facility] provider you are looking for under [facility] and page to a number that you can be directly reached. If you still have difficulty reaching the provider, please page the [facility] (Director on Call) for the Hospitalists listed on amion for assistance.  03/04/2024, 11:56 PM       [1] No Known Allergies  "

## 2024-03-04 NOTE — ED Triage Notes (Signed)
 Patient arrived with EMS reports sudden onset dizziness , right facial numbness and slurred speech , LSN 6:50 pm this evening , evaluated by EDP at arrival . CBG= 144.

## 2024-03-04 NOTE — H&P (Incomplete)
 " History and Physical    Jessica Carey FMW:[id number] DOB: 09-12-1985 DOA: 03/04/2024  PCP: Edman Meade PEDLAR, FNP  Patient coming from: Home  Chief Complaint: Facial numbness  HPI: Jessica Carey is a 39 y.o. female with hx of DM2, HLD, ?afibb vs SVT who in the evening today, got out of her [place] and [place], when she had sudden onset R facial numbness, slurred speech, tongue numbness and dysequilibrium. Symptoms lasted about 20 mins and then resolved. She does feel like the symptoms triggered a panic attack.   Endorses history of atrial fibrillation not on AC. Diagnosed during labor in February 2020. SVT ablation in 2020.   Notes intermittent on and off numbness and tlinging of hte right cheek and tongue.   No prior strokes, does not smoke, no chest pain, shortness of breath, muscle weakness. Fairly healthy otherwise.   ED Course: CTH without a bleed. Discussed with neurology who recommend admission for TIA. CTA and MRI brain ordered and pending.   Review of Systems: As per HPI otherwise all other systems reviewed and are negative.   Past Medical History:  Diagnosis Date   ASCUS of cervix with negative high risk HPV 12/24/2020   12/24/20 repeat pap in 3 years per [facility],5 year risk CIN3+ is 0.40%   Boil, breast 07/04/2013   Diabetes (HCC) 08/04/2013   Diabetes mellitus without complication (HCC)    glyburide  and insulin    Dysrhythmia    a-fib   Hyperlipidemia    Irregular periods     Past Surgical History:  Procedure Laterality Date   BREAST SURGERY Right    breast abcess   ELECTROPHYSIOLOGY STUDY N/A 03/22/2018   Procedure: ELECTROPHYSIOLOGY STUDY;  Surgeon: Waddell Danelle ORN, MD;  Location: [facility];  Service: Cardiovascular;  Laterality: N/A;   PERINEAL LACERATION REPAIR N/A 03/17/2018   Procedure: SUTURE REPAIR PERINEAL LACERATION;  Surgeon: Herchel Gloris LABOR, MD;  Location: [facility];  Service: Gynecology;  Laterality: N/A;    SURGERY OF LIP     SVT ABLATION N/A 03/22/2018   Procedure: SVT ABLATION;  Surgeon: Waddell Danelle ORN, MD;  Location: [facility];  Service: Cardiovascular;  Laterality: N/A;    Social History  reports that she has never smoked. She has never used smokeless tobacco. She reports that she does not currently use alcohol. She reports that she does not use drugs.  Allergies[1]  Family History  Problem Relation Age of Onset   Diabetes Mother    Diabetes Father    Hyperlipidemia Father    Colon polyps Father 69   Diabetes Sister    Diabetes Paternal Grandmother    Cancer Maternal Aunt        liver    Prior to Admission medications  Medication Sig Start Date End Date Taking? Authorizing Provider  glimepiride  (AMARYL ) 4 MG tablet Take 1 tablet (4 mg total) by mouth daily before breakfast. 03/02/24  Yes Gladis, Mary-Margaret, FNP  rosuvastatin  (CRESTOR ) 10 MG tablet Take 1 tablet (10 mg total) by mouth daily. 03/02/24  Yes Martin, Mary-Margaret, FNP  tirzepatide  (MOUNJARO ) 15 MG/0.5ML Pen Inject 15 mg into the skin once a week. Patient taking differently: Inject 15 mg into the skin every Sunday. 03/02/24  Yes Gladis, Mary-Margaret, FNP  blood glucose meter kit and supplies Dispense based on patient and insurance preference. Use up to four times daily as directed. (FOR ICD-10 E10.9, E11.9). 03/14/22   Bacchus, Meade PEDLAR, FNP  glucose blood (ONETOUCH VERIO)  test strip USE AS DIRECTED 4 TIMES DAILY TO CHECK GLUCOSE 09/01/23   Bacchus, Meade PEDLAR, FNP  Insulin  Pen Needle (BD PEN NEEDLE NANO U/F) 32G X 4 MM MISC Use with Ozempic  02/02/23   Bacchus, Meade PEDLAR, FNP  Lancets [facility] DELICA PLUS [place]) MISC as directed 02/02/23   Edman Meade PEDLAR, FNP    Physical Exam: Vitals:   03/04/24 2025 03/04/24 2130 03/04/24 2250  BP: 135/89  119/85  Pulse: 91  78  Resp: 17  20  Temp: 98.5 F (36.9 C)    SpO2: 100%  100%  Weight:  84.8 kg   Height:  5' 4 (1.626 m)     Constitutional: NAD,  calm, comfortable Vitals:   03/04/24 2025 03/04/24 2130 03/04/24 2250  BP: 135/89  119/85  Pulse: 91  78  Resp: 17  20  Temp: 98.5 F (36.9 C)    SpO2: 100%  100%  Weight:  84.8 kg   Height:  5' 4 (1.626 m)    Eyes: PERRL, lids and conjunctivae normal ENMT: Mucous membranes are moist. Posterior pharynx clear of any exudate or lesions.Normal dentition.  Neck: normal, supple, no masses, no thyromegaly Respiratory: clear to auscultation bilaterally, no wheezing, no crackles. Normal respiratory effort. No accessory muscle use.  Cardiovascular: Regular rate and rhythm, no murmurs / rubs / gallops. No extremity edema. 2+ pedal pulses. No carotid bruits.  Abdomen: no tenderness, no masses palpated. No hepatosplenomegaly. Bowel sounds positive.  Musculoskeletal: no clubbing / cyanosis. No joint deformity upper and lower extremities. Good ROM, no contractures. Normal muscle tone. 5/5 strength.  Skin: no rashes, lesions, ulcers. No induration Neurologic: CN 2-12 grossly intact. Sensation intact, DTR normal. Strength 5/5 in all 4.  Psychiatric: Normal judgment and insight. Alert and oriented x 3. Normal mood.  Labs on Admission: I have personally reviewed following labs and imaging studies  CBC: Recent Labs  Lab 03/02/24 1046 03/04/24 2020 03/04/24 2039  WBC 10.5 11.3*  --   NEUTROABS 7.0 7.9*  --   HGB 14.1 12.9 13.9  HCT 43.3 39.6 41.0  MCV 85 84.8  --   PLT 234 199  --     Basic Metabolic Panel: Recent Labs  Lab 03/02/24 1046 03/04/24 2020 03/04/24 2039  NA 136 138 140  K 4.5 3.9 3.8  CL 100 102 101  CO2 22 28  --   GLUCOSE 103* 195* 164*  BUN 18 15 15   CREATININE 0.89 0.79 0.80  CALCIUM  9.1 9.0  --     GFR: Estimated Creatinine Clearance: 101.4 mL/min (by C-G formula based on SCr of 0.8 mg/dL).  Liver Function Tests: Recent Labs  Lab 03/02/24 1046 03/04/24 2020  AST 13 22  ALT 14 15  ALKPHOS 70 77  BILITOT 0.8 0.5  PROT 7.9 8.0  ALBUMIN 4.2 4.2    Urine  analysis:    Component Value Date/Time   COLORURINE STRAW (A) 03/17/2018 2154   APPEARANCEUR CLEAR 03/17/2018 2154   APPEARANCEUR Clear 09/02/2017 1430   LABSPEC 1.024 03/17/2018 2154   PHURINE 6.0 03/17/2018 2154   GLUCOSEU NEGATIVE 03/17/2018 2154   HGBUR SMALL (A) 03/17/2018 2154   BILIRUBINUR negative 05/21/2022 0913   BILIRUBINUR Negative 09/02/2017 1430   KETONESUR negative 05/21/2022 0913   KETONESUR NEGATIVE 03/17/2018 2154   PROTEINUR NEGATIVE 03/17/2018 2154   UROBILINOGEN 0.2 05/21/2022 0913   NITRITE Negative 05/21/2022 0913   NITRITE NEGATIVE 03/17/2018 2154   LEUKOCYTESUR Negative 05/21/2022 0913   LEUKOCYTESUR NEGATIVE  03/17/2018 2154    Radiological Exams on Admission: CT HEAD WO CONTRAST Result Date: 03/04/2024 EXAM: CT HEAD WITHOUT CONTRAST 03/04/2024 08:42:00 PM TECHNIQUE: CT of the head was performed without the administration of intravenous contrast. Automated exposure control, iterative reconstruction, and/or weight based adjustment of the mA/kV was utilized to reduce the radiation dose to as low as reasonably achievable. COMPARISON: None available. CLINICAL HISTORY: Transient ischemic attack (TIA). FINDINGS: BRAIN AND VENTRICLES: No acute hemorrhage. No evidence of acute infarct. No hydrocephalus. No extra-axial collection. No mass effect or midline shift. ORBITS: No acute abnormality. SINUSES: No acute abnormality. SOFT TISSUES AND SKULL: No acute soft tissue abnormality. No skull fracture. IMPRESSION: 1. No acute intracranial abnormality. Electronically signed by: Norman Gatlin MD 03/04/2024 10:14 PM EST RP Workstation: [id number]      Assessment/Plan Principal Problem:   TIA (transient ischemic attack)  (please populate well all problems here in Problem List. (For example, if patient is on BP meds at home and you resume or decide to hold them, it is a problem that needs to be her. Same for CAD, COPD, HLD and so on)   ***  DVT prophylaxis: ***  (Lovenox/Heparin /SCD's/anticoagulated/None (if comfort care) Code Status:   *** (Full/Partial (specify details) Family Communication:  *** (Specify name, relationship. Do not write discussed with patient. Specify tel # if discussed over the phone) Disposition Plan:   Patient is from:  ***  Anticipated DC to:  ***  Anticipated DC date:  ***  Anticipated DC barriers: ***  Consults called:  *** (with names) Admission status:  *** (inpatient / obs / tele / medical floor / SDU)  Severity of Illness: {Observation/Inpatient:21159}    Hildegard Fila MD [facility]  How to contact the [facility] Attending or Consulting provider 7A - 7P or covering provider during after hours 7P -7A, for this patient?   Check the care team in [facility] and look for a) attending/consulting TR[facility] provider listed and b) the TRH team listed Log into [email or web address] and use [place]'s universal password to access. If you do not have the password, please contact the hospital operator. Locate the TRH provider you are looking for under [facility] and page to a number that you can be directly reached. If you still have difficulty reaching the provider, please page the [facility] (Director on Call) for the Hospitalists listed on amion for assistance.  03/04/2024, 11:56 PM         [1] No Known Allergies "

## 2024-03-04 NOTE — ED Provider Notes (Addendum)
 " [facility] AT [facility] Provider Note   CSN: [id number] Arrival date & time: 03/04/24  2014     Patient presents with: Numbness and Dizziness   Jessica Carey is a 39 y.o. female.   HPI 39 year old female presents with acute strokelike symptoms.  Brought in by EMS and initially screened by [place] but all of her symptoms have now resolved.  She tells me around 6:50 PM she was getting out of the car to go to the circus with her [place] and felt a sudden numbness/tingling to her right face.  Then proceeded to have dizziness.  The patient then noticed that when she was trying to talk her right side of her tongue seem to not work.  This lasted around 20 minutes.  No headache or focal weakness but she felt all over a week.  No other numbness.  Symptoms have now completely resolved.  EMS and patient report that she had a history of A-fib.  Patient states she had a procedure a few years ago.  She is not on a blood thinner.  However chart review shows that she had an SVT ablation in 2020.  Prior to Admission medications  Medication Sig Start Date End Date Taking? Authorizing Provider  blood glucose meter kit and supplies Dispense based on patient and insurance preference. Use up to four times daily as directed. (FOR ICD-10 E10.9, E11.9). 03/14/22   Bacchus, Meade PEDLAR, FNP  Cholecalciferol (VITAMIN D3) 25 MCG (1000 UT) CAPS Take 1 capsule (1,000 Units total) by mouth daily. Patient not taking: Reported on 08/27/2023 09/05/21   Paseda, Folashade R, FNP  glimepiride  (AMARYL ) 4 MG tablet Take 1 tablet (4 mg total) by mouth daily before breakfast. 03/02/24   Gladis, Mary-Margaret, FNP  glucose blood (ONETOUCH VERIO) test strip USE AS DIRECTED 4 TIMES DAILY TO CHECK GLUCOSE 09/01/23   Bacchus, Meade PEDLAR, FNP  Insulin  Pen Needle (BD PEN NEEDLE NANO U/F) 32G X 4 MM MISC Use with Ozempic  02/02/23   Bacchus, Meade PEDLAR, FNP  Lancets [facility] DELICA PLUS [place]) MISC as  directed 02/02/23   Edman Meade PEDLAR, FNP  Omega 3 1000 MG CAPS Take 1 daily 03/11/21   Paseda, Folashade R, FNP  rosuvastatin  (CRESTOR ) 10 MG tablet Take 1 tablet (10 mg total) by mouth daily. 03/02/24   Gladis Mary-Margaret, FNP  tirzepatide  (MOUNJARO ) 15 MG/0.5ML Pen Inject 15 mg into the skin once a week. 03/02/24   Gladis Mustard, FNP    Allergies: Patient has no known allergies.    Review of Systems  Eyes:  Negative for visual disturbance.  Cardiovascular:  Negative for chest pain.  Neurological:  Positive for dizziness, speech difficulty and numbness. Negative for headaches.    Updated Vital Signs BP 135/89 (BP Location: Right Arm)   Pulse 91   Temp 98.5 F (36.9 C)   Resp 17   Ht 5' 4 (1.626 m)   Wt 84.8 kg   SpO2 100%   BMI 32.09 kg/m   Physical Exam Vitals and nursing note reviewed.  Constitutional:      Appearance: She is well-developed.  HENT:     Head: Normocephalic and atraumatic.  Eyes:     Extraocular Movements: Extraocular movements intact.     Pupils: Pupils are equal, round, and reactive to light.  Cardiovascular:     Rate and Rhythm: Normal rate and regular rhythm.     Heart sounds: Normal heart sounds.  Pulmonary:  Effort: Pulmonary effort is normal.  Abdominal:     General: There is no distension.  Skin:    General: Skin is warm and dry.  Neurological:     Mental Status: She is alert.     Comments: CN 3-12 grossly intact. 5/5 strength in all 4 extremities. Grossly normal sensation, including face. Normal finger to nose.      (all labs ordered are listed, but only abnormal results are displayed) Labs Reviewed  CBC - Abnormal; Notable for the following components:      Result Value   WBC 11.3 (*)    All other components within normal limits  DIFFERENTIAL - Abnormal; Notable for the following components:   Neutro Abs 7.9 (*)    All other components within normal limits  COMPREHENSIVE METABOLIC PANEL WITH GFR - Abnormal; Notable  for the following components:   Glucose, Bld 195 (*)    All other components within normal limits  CBG MONITORING, ED - Abnormal; Notable for the following components:   Glucose-Capillary 160 (*)    All other components within normal limits  I-STAT CHEM 8, ED - Abnormal; Notable for the following components:   Glucose, Bld 164 (*)    All other components within normal limits  PROTIME-INR  APTT  ETHANOL  HCG, SERUM, QUALITATIVE  URINE DRUG SCREEN    EKG: EKG Interpretation Date/Time:  Friday March 04 2024 21:02:47 EST Ventricular Rate:  76 PR Interval:  165 QRS Duration:  103 QT Interval:  377 QTC Calculation: 424 R Axis:   94  Text Interpretation: Sinus rhythm Borderline right axis deviation Low voltage, precordial leads Confirmed by Freddi Hamilton [phone]) on 03/04/2024 10:05:32 PM  Radiology: CT HEAD WO CONTRAST Result Date: 03/04/2024 EXAM: CT HEAD WITHOUT CONTRAST 03/04/2024 08:42:00 PM TECHNIQUE: CT of the head was performed without the administration of intravenous contrast. Automated exposure control, iterative reconstruction, and/or weight based adjustment of the mA/kV was utilized to reduce the radiation dose to as low as reasonably achievable. COMPARISON: None available. CLINICAL HISTORY: Transient ischemic attack (TIA). FINDINGS: BRAIN AND VENTRICLES: No acute hemorrhage. No evidence of acute infarct. No hydrocephalus. No extra-axial collection. No mass effect or midline shift. ORBITS: No acute abnormality. SINUSES: No acute abnormality. SOFT TISSUES AND SKULL: No acute soft tissue abnormality. No skull fracture. IMPRESSION: 1. No acute intracranial abnormality. Electronically signed by: Norman Gatlin MD 03/04/2024 10:14 PM EST RP Workstation: [id number]     Procedures   Medications Ordered in the ED - No data to display                                  Medical Decision Making Amount and/or Complexity of Data Reviewed Labs: ordered.    Details: Mild  hyperglycemia Radiology: ordered and independent interpretation performed.    Details: No head bleed ECG/medicine tests: ordered and independent interpretation performed.    Details: Sinus rhythm  Risk Decision regarding hospitalization.   Patient presents with transient neurologic symptoms.  Concern for possible TIA.  Her chart has A-fib listed though her most recent cardiology note indicates she had SVT that was ablated.  Either way she has not had recent palpitations or arrhythmias.  She is currently asymptomatic.  CT head without head bleed.  Labs unremarkable besides some mild hyperglycemia and mild leukocytosis.  Discussed with Dr. Vanessa, who recommends TIA admission given the history.  Will order CTA per his recommendations as well.  Discussed with Dr. Gershon for admission.    Addendum: 11:29 PM  I was asked to see the patient as she has started to develop right sided facial numbness. It is near the corner of her lip.  No difference in sensation to light touch compared to her left side.  No facial droop, speech abnormality, tongue symptoms, dizziness or visual symptoms.  No weakness or numbness in her extremities.  I reconsulted Dr. Vanessa, recommends getting an MRI but based on the symptoms not activating a code stroke.  Final diagnoses:  TIA (transient ischemic attack)    ED Discharge Orders     None          Freddi Hamilton, MD 03/04/24 [id number]    Freddi Hamilton, MD 03/04/24 2330  "

## 2024-03-05 ENCOUNTER — Other Ambulatory Visit (HOSPITAL_COMMUNITY): Payer: Self-pay

## 2024-03-05 ENCOUNTER — Observation Stay (HOSPITAL_COMMUNITY)

## 2024-03-05 DIAGNOSIS — R233 Spontaneous ecchymoses: Secondary | ICD-10-CM | POA: Diagnosis not present

## 2024-03-05 DIAGNOSIS — I480 Paroxysmal atrial fibrillation: Secondary | ICD-10-CM | POA: Diagnosis not present

## 2024-03-05 DIAGNOSIS — G459 Transient cerebral ischemic attack, unspecified: Secondary | ICD-10-CM

## 2024-03-05 DIAGNOSIS — E669 Obesity, unspecified: Secondary | ICD-10-CM | POA: Diagnosis not present

## 2024-03-05 DIAGNOSIS — I639 Cerebral infarction, unspecified: Secondary | ICD-10-CM

## 2024-03-05 DIAGNOSIS — I6340 Cerebral infarction due to embolism of unspecified cerebral artery: Secondary | ICD-10-CM

## 2024-03-05 DIAGNOSIS — Z6832 Body mass index (BMI) 32.0-32.9, adult: Secondary | ICD-10-CM | POA: Diagnosis not present

## 2024-03-05 DIAGNOSIS — E119 Type 2 diabetes mellitus without complications: Secondary | ICD-10-CM

## 2024-03-05 DIAGNOSIS — E785 Hyperlipidemia, unspecified: Secondary | ICD-10-CM

## 2024-03-05 LAB — CBC WITH DIFFERENTIAL/PLATELET
Abs Immature Granulocytes: 0.06 10*3/uL (ref 0.00–0.07)
Basophils Absolute: 0.1 10*3/uL (ref 0.0–0.1)
Basophils Relative: 1 %
Eosinophils Absolute: 0.1 10*3/uL (ref 0.0–0.5)
Eosinophils Relative: 1 %
HCT: 40.8 % (ref 36.0–46.0)
Hemoglobin: 13.4 g/dL (ref 12.0–15.0)
Immature Granulocytes: 1 %
Lymphocytes Relative: 28 %
Lymphs Abs: 3.4 10*3/uL (ref 0.7–4.0)
MCH: 27.7 pg (ref 26.0–34.0)
MCHC: 32.8 g/dL (ref 30.0–36.0)
MCV: 84.3 fL (ref 80.0–100.0)
Monocytes Absolute: 0.6 10*3/uL (ref 0.1–1.0)
Monocytes Relative: 5 %
Neutro Abs: 8.1 10*3/uL — ABNORMAL HIGH (ref 1.7–7.7)
Neutrophils Relative %: 64 %
Platelets: 211 10*3/uL (ref 150–400)
RBC: 4.84 MIL/uL (ref 3.87–5.11)
RDW: 13.5 % (ref 11.5–15.5)
WBC: 12.4 10*3/uL — ABNORMAL HIGH (ref 4.0–10.5)
nRBC: 0 % (ref 0.0–0.2)

## 2024-03-05 LAB — COMPREHENSIVE METABOLIC PANEL WITH GFR
ALT: 15 U/L (ref 0–44)
AST: 21 U/L (ref 15–41)
Albumin: 4.2 g/dL (ref 3.5–5.0)
Alkaline Phosphatase: 79 U/L (ref 38–126)
Anion gap: 10 (ref 5–15)
BUN: 14 mg/dL (ref 6–20)
CO2: 25 mmol/L (ref 22–32)
Calcium: 9 mg/dL (ref 8.9–10.3)
Chloride: 101 mmol/L (ref 98–111)
Creatinine, Ser: 0.76 mg/dL (ref 0.44–1.00)
GFR, Estimated: >60 mL/min
Glucose, Bld: 115 mg/dL — ABNORMAL HIGH (ref 70–99)
Potassium: 3.9 mmol/L (ref 3.5–5.1)
Sodium: 137 mmol/L (ref 135–145)
Total Bilirubin: 0.5 mg/dL (ref 0.0–1.2)
Total Protein: 7.9 g/dL (ref 6.5–8.1)

## 2024-03-05 LAB — ECHOCARDIOGRAM COMPLETE
Area-P 1/2: 3.85 cm2
Height: 64 in
S' Lateral: 3.4 cm
Single Plane A4C EF: 70.8 %
Weight: 2991.2 [oz_av]

## 2024-03-05 LAB — GLUCOSE, CAPILLARY: Glucose-Capillary: 89 mg/dL (ref 70–99)

## 2024-03-05 LAB — HIV ANTIBODY (ROUTINE TESTING W REFLEX): HIV Screen 4th Generation wRfx: NONREACTIVE

## 2024-03-05 LAB — LIPID PANEL
Cholesterol: 95 mg/dL (ref 0–200)
HDL: 34 mg/dL — ABNORMAL LOW
LDL Cholesterol: 42 mg/dL (ref 0–99)
Total CHOL/HDL Ratio: 2.8 ratio
Triglycerides: 94 mg/dL
VLDL: 19 mg/dL (ref 0–40)

## 2024-03-05 LAB — HEMOGLOBIN A1C
Hgb A1c MFr Bld: 7.2 % — ABNORMAL HIGH (ref 4.8–5.6)
Mean Plasma Glucose: 159.94 mg/dL

## 2024-03-05 MED ORDER — ASPIRIN 81 MG PO TBEC
81.0000 mg | DELAYED_RELEASE_TABLET | Freq: Every day | ORAL | Status: DC
  Administered 2024-03-05: 81 mg via ORAL
  Filled 2024-03-05: qty 1

## 2024-03-05 MED ORDER — IOHEXOL 350 MG/ML SOLN
80.0000 mL | Freq: Once | INTRAVENOUS | Status: AC | PRN
  Administered 2024-03-05: 80 mL via INTRAVENOUS

## 2024-03-05 MED ORDER — APIXABAN 5 MG PO TABS
5.0000 mg | ORAL_TABLET | Freq: Two times a day (BID) | ORAL | 0 refills | Status: AC
  Filled 2024-03-05: qty 60, 30d supply, fill #0

## 2024-03-05 MED ORDER — INSULIN ASPART 100 UNIT/ML IJ SOLN: 0.0000 [IU] | Freq: Three times a day (TID) | INTRAMUSCULAR | Status: DC

## 2024-03-05 MED ORDER — APIXABAN 5 MG PO TABS
5.0000 mg | ORAL_TABLET | Freq: Two times a day (BID) | ORAL | Status: DC
  Administered 2024-03-05: 5 mg via ORAL
  Filled 2024-03-05: qty 1

## 2024-03-05 MED ORDER — POTASSIUM CHLORIDE CRYS ER 20 MEQ PO TBCR
40.0000 meq | EXTENDED_RELEASE_TABLET | Freq: Once | ORAL | Status: AC
  Administered 2024-03-05: 40 meq via ORAL
  Filled 2024-03-05: qty 2

## 2024-03-05 MED ORDER — PERFLUTREN LIPID MICROSPHERE
1.0000 mL | INTRAVENOUS | Status: AC | PRN
  Administered 2024-03-05: 4 mL via INTRAVENOUS

## 2024-03-05 NOTE — Evaluation (Signed)
 Physical Therapy Evaluation Patient Details Name: Jessica Carey MRN: [id number] DOB: January 24, 1986 Today's Date: 03/05/2024  History of Present Illness  Pt is a 39 y.o. F presenting to [facility] with R facial numbness, slurred speech, tongue numbness and dysequilibrium. MRI showed small acute ischemic infarct in L subcortical frontal lobe, and additional acute ischemic non hemorrhagic L cerebellar infarct. PMH is significant for DMT2, HLD, A-fib, SVT.  Clinical Impression  Prior to admittance, pt was indep with mobility and ADLs. Pt presents to evaluation near baseline level of function. Pt ambulated household distances without AD and no physical assistance and participated in higher level balance assessment (rapid change in speed, walking backwards) with no losses of balance or signs of instability. No further PT needs identified. PT signing off.         If plan is discharge home, recommend the following: Assist for transportation   Can travel by private vehicle        Equipment Recommendations None recommended by PT  Recommendations for Other Services       Functional Status Assessment Patient has not had a recent decline in their functional status     Precautions / Restrictions Precautions Precautions: None Restrictions Weight Bearing Restrictions Per Provider Order: No      Mobility  Bed Mobility Overal bed mobility: Independent                  Transfers Overall transfer level: Independent Equipment used: None                    Ambulation/Gait Ambulation/Gait assistance: Independent Gait Distance (Feet): 150 Feet Assistive device: None Gait Pattern/deviations: WFL(Within Functional Limits) Gait velocity: functional Gait velocity interpretation: 1.31 - 2.62 ft/sec, indicative of limited community ambulator   General Gait Details: Pt demonstrates reciprocal gait pattern with equal WB through BLE, no signs of instability or LOB.  Stairs             Wheelchair Mobility     Tilt Bed    Modified Rankin (Stroke Patients Only)       Balance Overall balance assessment: Independent                                           Pertinent Vitals/Pain Pain Assessment Pain Assessment: No/denies pain    Home Living Family/patient expects to be discharged to:: Private residence Living Arrangements: Other (Comment) (sister) Available Help at Discharge: Family;Available 24 hours/day Type of Home: Mobile home Home Access: Stairs to enter Entrance Stairs-Rails: Right;Left;Can reach both Entrance Stairs-Number of Steps: 3   Home Layout: One level Home Equipment: None      Prior Function Prior Level of Function : Independent/Modified Independent;Working/employed;Driving (is an designer, 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[employer])             Mobility Comments: indep ADLs Comments: indep     Extremity/Trunk Assessment   Upper Extremity Assessment Upper Extremity Assessment: Overall WFL for tasks assessed (intact sensation and fine motor)    Lower Extremity Assessment Lower Extremity Assessment: Overall WFL for tasks assessed (intact sensation)    Cervical / Trunk Assessment Cervical / Trunk Assessment: Normal  Communication   Communication Communication: No apparent difficulties    Cognition Arousal: Alert Behavior During Therapy: WFL for tasks assessed/performed   PT - Cognitive impairments: No apparent impairments  Following commands: Intact       Cueing Cueing Techniques: Verbal cues     General Comments General comments (skin integrity, edema, etc.): VSS on RA    Exercises     Assessment/Plan    PT Assessment Patient does not need any further PT services  PT Problem List         PT Treatment Interventions      PT Goals (Current goals can be found in the Care Plan section)  Acute Rehab PT Goals Patient Stated Goal: to go home PT Goal  Formulation: With patient Time For Goal Achievement: 03/19/24 Potential to Achieve Goals: Good    Frequency       Co-evaluation               AM-PAC PT 6 Clicks Mobility  Outcome Measure Help needed turning from your back to your side while in a flat bed without using bedrails?: None Help needed moving from lying on your back to sitting on the side of a flat bed without using bedrails?: None Help needed moving to and from a bed to a chair (including a wheelchair)?: None Help needed standing up from a chair using your arms (e.g., wheelchair or bedside chair)?: None Help needed to walk in hospital room?: None Help needed climbing 3-5 steps with a railing? : None 6 Click Score: 24    End of Session Equipment Utilized During Treatment: Gait belt Activity Tolerance: Patient tolerated treatment well Patient left: in chair;with call bell/phone within reach Nurse Communication: Mobility status      Time: 8762-8749 PT Time Calculation (min) (ACUTE ONLY): 13 min   Charges:   PT Evaluation $PT Eval Low Complexity: 1 Low   PT General Charges $$ ACUTE PT VISIT: 1 Visit         Jessica Hilt DPT Acute Rehab Services [phone] Prefer contact via chat   Jessica Carey 03/05/2024, 1:10 PM

## 2024-03-05 NOTE — ED Notes (Signed)
 Patient ambulatory to restroom without assistance. Stable gait without increase in symptoms or any distress.

## 2024-03-05 NOTE — Progress Notes (Signed)
 OT Cancellation Note  Patient Details Name: Jessica Carey MRN: [id number] DOB: Apr 22, 1985   Cancelled Treatment:    Reason Eval/Treat Not Completed: OT screened, no needs identified, will sign off. Per PT, pt ind with ADLs, reporting no OT related concerns. OT is signing off on this pt, please re-consult as needed.   [name] C, OT  Acute Rehabilitation Services Office [phone] Secure chat preferred   [name] Savers 03/05/2024, 1:03 PM

## 2024-03-05 NOTE — Progress Notes (Signed)
" °  Echocardiogram 2D Echocardiogram has been performed.  Koleen KANDICE Popper, RDCS 03/05/2024, 10:53 AM "

## 2024-03-05 NOTE — Discharge Summary (Signed)
 " [facility]  Physician Discharge Summary   Patient ID: Jessica Carey MRN: [id number] DOB/AGE: Mar 19, 1985 38 y.o.  Admit date: 03/04/2024 Discharge date: 03/05/2024    PCP: Edman Meade PEDLAR, FNP  DISCHARGE DIAGNOSES:    Acute CVA (cerebrovascular accident) [facility]) History of atrial fibrillation/atrial flutter/SVT status post ablation Diabetes mellitus type 2  RECOMMENDATIONS FOR OUTPATIENT FOLLOW UP: Referral sent to cardiology for outpatient follow-up Ambulatory referral sent to neurology   Home Health: None Equipment/Devices: None  CODE STATUS: Full code  DISCHARGE CONDITION: fair  Diet recommendation: Heart healthy  INITIAL HISTORY: 39 y.o. female with hx of DM2, HLD, afib/aflutter/SVT who presented with right facial numbness slurred speech and disequilibrium.  Symptoms lasted about 20 minutes.  Patient is not on anticoagulation.  She was hospitalized for further management.     Consultants: Neurology   Procedures: Echocardiogram  HOSPITAL COURSE:   Acute CVA Patient presented with right facial numbness, transient speech impairment and disequilibrium. MRI reveals acute stroke in the left frontal lobe as well as left cerebellum. CT angiogram does not reveal any large vessel occlusion. LDL noted to be 42.  Patient mentions that she is on statin at home. HbA1c 7.2.  Patient with history of diabetes. PT OT SLP is pending Echocardiogram shows normal LVEF. Seen by neurology.  Aspirin  was recommended.  However patient with history of cardiac arrhythmias as detailed below.   Has not been on anticoagulation.  Currently in sinus rhythm. Seen by stroke neurology today.  Anticoagulation is recommended.  Started on apixaban .   History of SVT/AVNRT/atrial fibrillation/atrial flutter Patient with initial episode of atrial fibrillation in 2019 and then atrial flutter in 2020.  She is status post ablation in 2020.  Followed by electrophysiology.  Last seen by Dr. Waddell in  2024. Started on apixaban  as discussed above.  Message sent to cardiology for outpatient follow-up.   Diabetes mellitus type 2 HbA1c 7.2.  Will initiate SSI.  Monitor CBGs.   Obesity Estimated body mass index is 32.09 kg/m as calculated from the following:   Height as of this encounter: 5' 4 (1.626 m).   Weight as of this encounter: 84.8 kg.  Patient is stable.  Okay for discharge home today.   PERTINENT LABS:  The results of significant diagnostics from this hospitalization (including imaging, microbiology, ancillary and laboratory) are listed below for reference.     Labs:   Basic Metabolic Panel: Recent Labs  Lab 03/02/24 1046 03/04/24 2020 03/04/24 2039 03/05/24 0049  NA 136 138 140 137  K 4.5 3.9 3.8 3.9  CL 100 102 101 101  CO2 22 28  --  25  GLUCOSE 103* 195* 164* 115*  BUN 18 15 15 14   CREATININE 0.89 0.79 0.80 0.76  CALCIUM  9.1 9.0  --  9.0   Liver Function Tests: Recent Labs  Lab 03/02/24 1046 03/04/24 2020 03/05/24 0049  AST 13 22 21   ALT 14 15 15   ALKPHOS 70 77 79  BILITOT 0.8 0.5 0.5  PROT 7.9 8.0 7.9  ALBUMIN 4.2 4.2 4.2    CBC: Recent Labs  Lab 03/02/24 1046 03/04/24 2020 03/04/24 2039 03/05/24 0049  WBC 10.5 11.3*  --  12.4*  NEUTROABS 7.0 7.9*  --  8.1*  HGB 14.1 12.9 13.9 13.4  HCT 43.3 39.6 41.0 40.8  MCV 85 84.8  --  84.3  PLT 234 199  --  211    CBG: Recent Labs  Lab 03/04/24 2056 03/05/24 1600  GLUCAP 160* 89  IMAGING STUDIES ECHOCARDIOGRAM COMPLETE Result Date: 03/05/2024    ECHOCARDIOGRAM REPORT   Patient Name:   Jessica Carey Date of Exam: 03/05/2024 Medical Rec #:  [id number]    Height:       64.0 in Accession #:    [id number]   Weight:       186.9 lb Date of Birth:  [birth year]   BSA:          1.901 m Patient Age:    38 years     BP:           96/83 mmHg Patient Gender: F            HR:           70 bpm. Exam Location:  Inpatient Procedure: 2D Echo, Cardiac Doppler, Color Doppler and Intracardiac             Opacification Agent (Both Spectral and Color Flow Doppler were            utilized during procedure). Indications:    TIA G45.9  History:        Patient has prior history of Echocardiogram examinations, most                 recent 03/18/2018. TIA, Arrythmias:Hx of AFIB; Risk                 Factors:Diabetes and Dyslipidemia.  Sonographer:    Koleen Popper RDCS Referring Phys: ALI MALIK  Sonographer Comments: Image acquisition challenging due to patient body habitus. IMPRESSIONS  1. Left ventricular ejection fraction, by estimation, is 60 to 65%. The left ventricle has normal function. The left ventricle has no regional wall motion abnormalities. Left ventricular diastolic parameters were normal.  2. Right ventricular systolic function is normal. The right ventricular size is normal. There is normal pulmonary artery systolic pressure.  3. The mitral valve is normal in structure. No evidence of mitral valve regurgitation. No evidence of mitral stenosis.  4. The aortic valve is normal in structure. Aortic valve regurgitation is not visualized. No aortic stenosis is present.  5. The inferior vena cava is normal in size with greater than 50% respiratory variability, suggesting right atrial pressure of 3 mmHg. FINDINGS  Left Ventricle: Left ventricular ejection fraction, by estimation, is 60 to 65%. The left ventricle has normal function. The left ventricle has no regional wall motion abnormalities. Definity  contrast agent was given IV to delineate the left ventricular  endocardial borders. The left ventricular internal cavity size was normal in size. There is no left ventricular hypertrophy. Left ventricular diastolic parameters were normal. Right Ventricle: The right ventricular size is normal. No increase in right ventricular wall thickness. Right ventricular systolic function is normal. There is normal pulmonary artery systolic pressure. The tricuspid regurgitant velocity is 2.35 m/s, and  with an assumed right atrial  pressure of 8 mmHg, the estimated right ventricular systolic pressure is 30.1 mmHg. Left Atrium: Left atrial size was normal in size. Right Atrium: Right atrial size was normal in size. Pericardium: There is no evidence of pericardial effusion. Mitral Valve: The mitral valve is normal in structure. No evidence of mitral valve regurgitation. No evidence of mitral valve stenosis. Tricuspid Valve: The tricuspid valve is normal in structure. Tricuspid valve regurgitation is not demonstrated. No evidence of tricuspid stenosis. Aortic Valve: The aortic valve is normal in structure. Aortic valve regurgitation is not visualized. No aortic stenosis is present. Pulmonic Valve: The pulmonic valve was normal in structure. Pulmonic  valve regurgitation is not visualized. No evidence of pulmonic stenosis. Aorta: The aortic root and ascending aorta are structurally normal, with no evidence of dilitation. Venous: The inferior vena cava is normal in size with greater than 50% respiratory variability, suggesting right atrial pressure of 3 mmHg. IAS/Shunts: No atrial level shunt detected by color flow Doppler.  LEFT VENTRICLE PLAX 2D LVIDd:         5.40 cm      Diastology LVIDs:         3.40 cm      LV e' medial:    9.90 cm/s LV PW:         0.80 cm      LV E/e' medial:  8.6 LV IVS:        0.90 cm      LV e' lateral:   16.50 cm/s LVOT diam:     1.90 cm      LV E/e' lateral: 5.2 LV SV:         65 LV SV Index:   34 LVOT Area:     2.84 cm  LV Volumes (MOD) LV vol d, MOD A4C: 134.0 ml LV vol s, MOD A4C: 39.1 ml LV SV MOD A4C:     134.0 ml RIGHT VENTRICLE             IVC RV Basal diam:  3.80 cm     IVC diam: 2.10 cm RV S prime:     12.70 cm/s TAPSE (M-mode): 2.4 cm LEFT ATRIUM             Index        RIGHT ATRIUM           Index LA diam:        4.00 cm 2.10 cm/m   RA Area:     13.80 cm LA Vol (A2C):   35.6 ml 18.73 ml/m  RA Volume:   30.20 ml  15.89 ml/m LA Vol (A4C):   48.7 ml 25.62 ml/m LA Biplane Vol: 45.8 ml 24.09 ml/m  AORTIC  VALVE LVOT Vmax:   111.00 cm/s LVOT Vmean:  70.200 cm/s LVOT VTI:    0.229 m  AORTA Ao Root diam: 2.90 cm Ao Asc diam:  3.20 cm MITRAL VALVE               TRICUSPID VALVE MV Area (PHT): 3.85 cm    TR Peak grad:   22.1 mmHg MV Decel Time: 197 msec    TR Vmax:        235.00 cm/s MV E velocity: 85.10 cm/s MV A velocity: 42.70 cm/s  SHUNTS MV E/A ratio:  1.99        Systemic VTI:  0.23 m                            Systemic Diam: 1.90 cm Kardie Tobb DO Electronically signed by Dub Huntsman DO Signature Date/Time: 03/05/2024/1:09:38 PM    Final    MR BRAIN WO CONTRAST Result Date: 03/05/2024 CLINICAL DATA:  Initial evaluation for acute neuro deficit, stroke suspected. EXAM: MRI HEAD WITHOUT CONTRAST TECHNIQUE: Multiplanar, multiecho pulse sequences of the brain and surrounding structures were obtained without intravenous contrast. COMPARISON:  Prior CT from earlier the same day. FINDINGS: Brain: Cerebral volume within normal limits. No significant cerebral white matter disease. Subtle focus of mild diffusion signal abnormality involving the subcortical left frontal lobe, consistent with a small acute ischemic  infarct (series 5, images 81, 80). Small focus of associated petechial hemorrhage at this location (series 12, image 41). Additional 8 mm acute ischemic left cerebellar infarct (series 5, image 59). No associated hemorrhage at this location. Otherwise, gray-white matter differentiation maintained. No areas of chronic cortical infarction. No other acute or chronic intracranial blood products. No mass lesion, midline shift or mass effect. No hydrocephalus or extra-axial fluid collection. Pituitary gland within normal limits. Vascular: Major intracranial vascular flow voids are maintained. Skull and upper cervical spine: Cranial junction within normal limits. Decreased T1 signal intensity within the visualized bone marrow, nonspecific, but most commonly related to anemia, smoking or obesity. No scalp soft tissue  abnormality. Sinuses/Orbits: Globes and orbital soft tissues within normal limits. Paranasal sinuses are clear. No significant mastoid effusion. Other: None. IMPRESSION: 1. Small acute ischemic infarct involving the subcortical left frontal lobe. Associated small focus of petechial hemorrhage at this location. 2. Additional 8 mm acute ischemic nonhemorrhagic left cerebellar infarct. 3. Otherwise unremarkable brain MRI. Electronically Signed   By: Morene Hoard M.D.   On: 03/05/2024 05:14   CT ANGIO HEAD NECK W WO CM Result Date: 03/05/2024 CLINICAL DATA:  Initial evaluation for acute numbness, dizziness, stroke/TIA. EXAM: CT ANGIOGRAPHY HEAD AND NECK WITH AND WITHOUT CONTRAST TECHNIQUE: Multidetector CT imaging of the head and neck was performed using the standard protocol during bolus administration of intravenous contrast. Multiplanar CT image reconstructions and MIPs were obtained to evaluate the vascular anatomy. Carotid stenosis measurements (when applicable) are obtained utilizing NASCET criteria, using the distal internal carotid diameter as the denominator. RADIATION DOSE REDUCTION: This exam was performed according to the departmental dose-optimization program which includes automated exposure control, adjustment of the mA and/or kV according to patient size and/or use of iterative reconstruction technique. CONTRAST:  80mL OMNIPAQUE  IOHEXOL  350 MG/ML SOLN COMPARISON:  Comparison made with from 03/04/2024. FINDINGS: CT HEAD FINDINGS Brain: A noncontrast head CT was repeated due to technical air. Images are limited as the skull vertex is incompletely visualized. Visualized portions of the brain are within normal limits. No acute intracranial hemorrhage. No acute large vessel territory infarct. No mass lesion or midline shift. No hydrocephalus or extra-axial fluid collection. Vascular: No abnormal hyperdense vessel. Skull: Visualized scalp soft tissues and calvarium within normal limits.  Sinuses/Orbits: Globes and orbital soft tissues within normal limits. Paranasal sinuses and mastoid air cells are largely clear. Other: None. Review of the MIP images confirms the above findings CTA NECK FINDINGS Aortic arch: Standard branching. Imaged portion shows no evidence of aneurysm or dissection. No significant stenosis of the major arch vessel origins. Right carotid system: No evidence of dissection, stenosis (50% or greater), or occlusion. Left carotid system: No evidence of dissection, stenosis (50% or greater), or occlusion. Vertebral arteries: No evidence of dissection, stenosis (50% or greater), or occlusion. Skeleton: No discrete or worrisome osseous lesions. Other neck: No other acute finding. Upper chest: No other acute finding. Review of the MIP images confirms the above findings CTA HEAD FINDINGS Anterior circulation: Both internal carotid arteries are patent to the siphons without stenosis or other abnormality. A1 segments patent bilaterally. Left A1 dominant. Normal anterior communicating artery complex. Both ACAs patent to their distal aspects without stenosis. No M1 stenosis or occlusion. No proximal MCA branch occlusion or high-grade stenosis. Distal MCA branches perfused and symmetric. Posterior circulation: Both V4 segments patent without stenosis. Left vertebral artery dominant. Left PICA patent. Right PICA not seen. Basilar patent without stenosis. Superior cerebellar and posterior cerebral  arteries patent bilaterally. Venous sinuses: Patent allowing for timing the contrast bolus. Anatomic variants: As above.  No aneurysm. Review of the MIP images confirms the above findings IMPRESSION: 1. Normal CTA of the head and neck. No large vessel occlusion or other emergent finding. No hemodynamically significant or correctable stenosis. 2. No other acute intracranial abnormality. Electronically Signed   By: Morene Hoard M.D.   On: 03/05/2024 01:55   CT HEAD WO CONTRAST Result Date:  03/04/2024 EXAM: CT HEAD WITHOUT CONTRAST 03/04/2024 08:42:00 PM TECHNIQUE: CT of the head was performed without the administration of intravenous contrast. Automated exposure control, iterative reconstruction, and/or weight based adjustment of the mA/kV was utilized to reduce the radiation dose to as low as reasonably achievable. COMPARISON: None available. CLINICAL HISTORY: Transient ischemic attack (TIA). FINDINGS: BRAIN AND VENTRICLES: No acute hemorrhage. No evidence of acute infarct. No hydrocephalus. No extra-axial collection. No mass effect or midline shift. ORBITS: No acute abnormality. SINUSES: No acute abnormality. SOFT TISSUES AND SKULL: No acute soft tissue abnormality. No skull fracture. IMPRESSION: 1. No acute intracranial abnormality. Electronically signed by: Norman Gatlin MD 03/04/2024 10:14 PM EST RP Workstation: [id number]    DISCHARGE EXAMINATION: See progress note from earlier today  DISPOSITION: Home  Discharge Instructions     Ambulatory referral to Cardiology   Complete by: As directed    Patient follow-up with Dr. Waddell in the past, now need urgent cardiology follow-up for A-fib.  Thanks   Ambulatory referral to Neurology   Complete by: As directed    Follow up with stroke clinic NP at [facility] in about 4-6 weeks. Thanks.   Call MD for:  difficulty breathing, headache or visual disturbances   Complete by: As directed    Call MD for:  extreme fatigue   Complete by: As directed    Call MD for:  persistant dizziness or light-headedness   Complete by: As directed    Call MD for:  persistant nausea and vomiting   Complete by: As directed    Call MD for:  severe uncontrolled pain   Complete by: As directed    Call MD for:  temperature >100.4   Complete by: As directed    Diet - low sodium heart healthy   Complete by: As directed    Diet Carb Modified   Complete by: As directed    Discharge instructions   Complete by: As directed    Message will be sent to cardiology  and neurology to arrange outpatient follow up. Please take your medications as prescribed.  You were cared for by a hospitalist during your hospital stay. If you have any questions about your discharge medications or the care you received while you were in the hospital after you are discharged, you can call the unit and asked to speak with the hospitalist on call if the hospitalist that took care of you is not available. Once you are discharged, your primary care physician will handle any further medical issues. Please note that NO REFILLS for any discharge medications will be authorized once you are discharged, as it is imperative that you return to your primary care physician (or establish a relationship with a primary care physician if you do not have one) for your aftercare needs so that they can reassess your need for medications and monitor your lab values. If you do not have a primary care physician, you can call [phone] for a physician referral.   Increase activity slowly   Complete by: As directed  Allergies as of 03/05/2024   No Known Allergies      Medication List     TAKE these medications    BD Pen Needle Nano U/F 32G X 4 MM Misc Generic drug: Insulin  Pen Needle Use with Ozempic    blood glucose meter kit and supplies Dispense based on patient and insurance preference. Use up to four times daily as directed. (FOR ICD-10 E10.9, E11.9).   Eliquis  5 MG Tabs tablet Generic drug: apixaban  Take 1 tablet (5 mg total) by mouth 2 (two) times daily.   glimepiride  4 MG tablet Commonly known as: AMARYL  Take 1 tablet (4 mg total) by mouth daily before breakfast.   OneTouch Delica Plus Lancet33G Misc as directed   OneTouch Verio test strip Generic drug: glucose blood USE AS DIRECTED 4 TIMES DAILY TO CHECK GLUCOSE   rosuvastatin  10 MG tablet Commonly known as: CRESTOR  Take 1 tablet (10 mg total) by mouth daily.   tirzepatide  15 MG/0.5ML Pen Commonly known as:  MOUNJARO  Inject 15 mg into the skin once a week. What changed: when to take this          Follow-up Information     Bacchus, Meade PEDLAR, FNP. Schedule an appointment as soon as possible for a visit in 1 week(s).   Specialty: Family Medicine Why: post hospitalization follow up Contact information: [address] [place] [address] [phone]         [facility]. Schedule an appointment as soon as possible for a visit in 1 month(s).   Specialty: Neurology Why: stroke clinic Contact information: [address] [place]  [address] [phone]        Cardiology. Schedule an appointment as soon as possible for a visit in 2 week(s).                  TOTAL DISCHARGE TIME: 35 minutes  Jodeen Mclin [employer] on [email or web address]  03/06/2024, 10:43 AM    "

## 2024-03-05 NOTE — Discharge Instructions (Signed)
 Information on my medicine - ELIQUIS  (apixaban )  This medication education was reviewed with me or my healthcare representative as part of my discharge preparation.  The pharmacist that spoke with me during my hospital stay was:  Elma Fail, [facility]  Why was Eliquis  prescribed for you? Eliquis  was prescribed for you to reduce the risk of forming blood clots that can cause a stroke if you have a medical condition called atrial fibrillation (a type of irregular heartbeat) OR to reduce the risk of a blood clots forming after orthopedic surgery.  What do You need to know about Eliquis  ? Take your Eliquis  TWICE DAILY - one tablet in the morning and one tablet in the evening with or without food.  It would be best to take the doses about the same time each day.  If you have difficulty swallowing the tablet whole please discuss with your pharmacist how to take the medication safely.  Take Eliquis  exactly as prescribed by your doctor and DO NOT stop taking Eliquis  without talking to the doctor who prescribed the medication.  Stopping may increase your risk of developing a new clot or stroke.  Refill your prescription before you run out.  After discharge, you should have regular check-up appointments with your healthcare provider that is prescribing your Eliquis .  In the future your dose may need to be changed if your kidney function or weight changes by a significant amount or as you get older.  What do you do if you miss a dose? If you miss a dose, take it as soon as you remember on the same day and resume taking twice daily.  Do not take more than one dose of ELIQUIS  at the same time.  Important Safety Information A possible side effect of Eliquis  is bleeding. You should call your healthcare provider right away if you experience any of the following: Bleeding from an injury or your nose that does not stop. Unusual colored urine (red or dark brown) or unusual colored stools (red or  black). Unusual bruising for unknown reasons. A serious fall or if you hit your head (even if there is no bleeding).  Some medicines may interact with Eliquis  and might increase your risk of bleeding or clotting while on Eliquis . To help avoid this, consult your healthcare provider or pharmacist prior to using any new prescription or non-prescription medications, including herbals, vitamins, non-steroidal anti-inflammatory drugs (NSAIDs) and supplements.  This website has more information on Eliquis  (apixaban ): [email or web address] .[email or web address] dena

## 2024-03-05 NOTE — Progress Notes (Signed)
 SLP Cancellation Note  Patient Details Name: Jessica Carey MRN: [id number] DOB: April 16, 1985   Cancelled treatment:       Reason Eval/Treat Not Completed: Discussed with RN and MD. Pt passed the swallow screen and does not need a formal SLP swallowing evaluation at this time. Consider ordering a cognitive-linguistic evaluation given acute CVA on MRI and SLP will f/u to complete that as able.    Damien Blumenthal, M.A., CCC-SLP Speech Language Pathology, Acute Rehabilitation Services  Secure Chat preferred [phone]  03/05/2024, 9:08 AM

## 2024-03-05 NOTE — Progress Notes (Signed)
 "  [facility] PROGRESS NOTE   Jessica Carey FMW:[id number] DOB: 03-06-1985 DOA: 03/04/2024  PCP: Edman Meade PEDLAR, FNP  Brief History: 39 y.o. female with hx of DM2, HLD, afib/aflutter/SVT who presented with right facial numbness slurred speech and disequilibrium.  Symptoms lasted about 20 minutes.  Patient is not on anticoagulation.  She was hospitalized for further management.    Consultants: Neurology  Procedures: Echocardiogram is pending   Subjective/Interval History: Patient mentions that she still has mild numbness in the right face.  Denies any weakness in the right arm or leg.  Has not ambulated since she has been in the hospital.   Assessment/Plan:  Acute CVA Patient presented with right facial numbness, transient speech impairment and disequilibrium. MRI reveals acute stroke in the left frontal lobe as well as left cerebellum. CT angiogram does not reveal any large vessel occlusion. LDL noted to be 42.  Patient mentions that she is on statin at home. HbA1c 7.2.  Patient with history of diabetes. PT OT SLP is pending Echocardiogram is pending. Seen by neurology.  Aspirin  was recommended.  However patient with history of cardiac arrhythmias as detailed below.   Has not been on anticoagulation.  Currently in sinus rhythm. Will wait for stroke neurology input.  Will likely need to transition to anticoagulation.  Patient followed by Dr. Waddell with electrophysiology.  Will need to be seen by cardiology again in the outpatient setting.  May need cardiac monitor versus loop recorder.  History of SVT/AVNRT/atrial fibrillation/atrial flutter Patient with initial episode of atrial fibrillation in 2019 and then atrial flutter in 2020.  She is status post ablation in 2020.  Followed by electrophysiology.  Last seen by Dr. Waddell in 2024. Not on anticoagulation at baseline.  Not on any rate limiting drugs either.  Diabetes mellitus type 2 HbA1c 7.2.  Will initiate SSI.   Monitor CBGs.  Obesity Estimated body mass index is 32.09 kg/m as calculated from the following:   Height as of this encounter: 5' 4 (1.626 m).   Weight as of this encounter: 84.8 kg.  DVT Prophylaxis: Subcutaneous heparin  Code Status: Full code Family Communication: Discussed with patient Disposition Plan: Hopefully return home when cleared by neurology  Status is: Observation The patient remains OBS appropriate and may or may not d/c before 2 midnights.   Medications: Scheduled:   stroke: early stages of recovery book   Does not apply Once   heparin   5,000 Units Subcutaneous Q8H   rosuvastatin   10 mg Oral Daily   Continuous: PRN:acetaminophen  **OR** acetaminophen  (TYLENOL ) oral liquid 160 mg/5 mL **OR** acetaminophen    Objective:  Vital Signs  Vitals:   03/05/24 0137 03/05/24 0400 03/05/24 0500 03/05/24 0600  BP: 123/80 102/78 97/80 96/83   Pulse: 89 72 73 74  Resp: 16 (!) 23 (!) 23 (!) 21  Temp: 98.4 F (36.9 C)     TempSrc: Oral     SpO2: 100% 96% 97% 98%  Weight:      Height:       No intake or output data in the 24 hours ending 03/05/24 0841 Filed Weights   03/04/24 2130  Weight: 84.8 kg    General appearance: Awake alert.  In no distress Resp: Clear to auscultation bilaterally.  Normal effort Cardio: S1-S2 is normal regular.  No S3-S4.  No rubs murmurs or bruit GI: Abdomen is soft.  Nontender nondistended.  Bowel sounds are present normal.  No masses organomegaly Extremities: No edema.  Full range of motion of  lower extremities. Neurologic: Alert and oriented x3.  No focal neurological deficits.    Lab Results:  Data Reviewed: I have personally reviewed following labs and reports of the imaging studies  CBC: Recent Labs  Lab 03/02/24 1046 03/04/24 2020 03/04/24 2039 03/05/24 0049  WBC 10.5 11.3*  --  12.4*  NEUTROABS 7.0 7.9*  --  8.1*  HGB 14.1 12.9 13.9 13.4  HCT 43.3 39.6 41.0 40.8  MCV 85 84.8  --  84.3  PLT 234 199  --  211     Basic Metabolic Panel: Recent Labs  Lab 03/02/24 1046 03/04/24 2020 03/04/24 2039 03/05/24 0049  NA 136 138 140 137  K 4.5 3.9 3.8 3.9  CL 100 102 101 101  CO2 22 28  --  25  GLUCOSE 103* 195* 164* 115*  BUN 18 15 15 14   CREATININE 0.89 0.79 0.80 0.76  CALCIUM  9.1 9.0  --  9.0    GFR: Estimated Creatinine Clearance: 101.4 mL/min (by C-G formula based on SCr of 0.76 mg/dL).  Liver Function Tests: Recent Labs  Lab 03/02/24 1046 03/04/24 2020 03/05/24 0049  AST 13 22 21   ALT 14 15 15   ALKPHOS 70 77 79  BILITOT 0.8 0.5 0.5  PROT 7.9 8.0 7.9  ALBUMIN 4.2 4.2 4.2   Coagulation Profile: Recent Labs  Lab 03/04/24 2020  INR 1.0   HbA1C: Recent Labs    03/05/24 0049  HGBA1C 7.2*    CBG: Recent Labs  Lab 03/04/24 2056  GLUCAP 160*    Lipid Profile: Recent Labs    03/02/24 1046 03/05/24 0049  CHOL 100 95  HDL 33* 34*  LDLCALC 51 42  TRIG 77 94  CHOLHDL 3.0 2.8    Radiology Studies: MR BRAIN WO CONTRAST Result Date: 03/05/2024 CLINICAL DATA:  Initial evaluation for acute neuro deficit, stroke suspected. EXAM: MRI HEAD WITHOUT CONTRAST TECHNIQUE: Multiplanar, multiecho pulse sequences of the brain and surrounding structures were obtained without intravenous contrast. COMPARISON:  Prior CT from earlier the same day. FINDINGS: Brain: Cerebral volume within normal limits. No significant cerebral white matter disease. Subtle focus of mild diffusion signal abnormality involving the subcortical left frontal lobe, consistent with a small acute ischemic infarct (series 5, images 81, 80). Small focus of associated petechial hemorrhage at this location (series 12, image 41). Additional 8 mm acute ischemic left cerebellar infarct (series 5, image 59). No associated hemorrhage at this location. Otherwise, gray-white matter differentiation maintained. No areas of chronic cortical infarction. No other acute or chronic intracranial blood products. No mass lesion, midline  shift or mass effect. No hydrocephalus or extra-axial fluid collection. Pituitary gland within normal limits. Vascular: Major intracranial vascular flow voids are maintained. Skull and upper cervical spine: Cranial junction within normal limits. Decreased T1 signal intensity within the visualized bone marrow, nonspecific, but most commonly related to anemia, smoking or obesity. No scalp soft tissue abnormality. Sinuses/Orbits: Globes and orbital soft tissues within normal limits. Paranasal sinuses are clear. No significant mastoid effusion. Other: None. IMPRESSION: 1. Small acute ischemic infarct involving the subcortical left frontal lobe. Associated small focus of petechial hemorrhage at this location. 2. Additional 8 mm acute ischemic nonhemorrhagic left cerebellar infarct. 3. Otherwise unremarkable brain MRI. Electronically Signed   By: Morene Hoard M.D.   On: 03/05/2024 05:14   CT ANGIO HEAD NECK W WO CM Result Date: 03/05/2024 CLINICAL DATA:  Initial evaluation for acute numbness, dizziness, stroke/TIA. EXAM: CT ANGIOGRAPHY HEAD AND NECK WITH AND WITHOUT CONTRAST  TECHNIQUE: Multidetector CT imaging of the head and neck was performed using the standard protocol during bolus administration of intravenous contrast. Multiplanar CT image reconstructions and MIPs were obtained to evaluate the vascular anatomy. Carotid stenosis measurements (when applicable) are obtained utilizing NASCET criteria, using the distal internal carotid diameter as the denominator. RADIATION DOSE REDUCTION: This exam was performed according to the departmental dose-optimization program which includes automated exposure control, adjustment of the mA and/or kV according to patient size and/or use of iterative reconstruction technique. CONTRAST:  80mL OMNIPAQUE  IOHEXOL  350 MG/ML SOLN COMPARISON:  Comparison made with from 03/04/2024. FINDINGS: CT HEAD FINDINGS Brain: A noncontrast head CT was repeated due to technical air. Images  are limited as the skull vertex is incompletely visualized. Visualized portions of the brain are within normal limits. No acute intracranial hemorrhage. No acute large vessel territory infarct. No mass lesion or midline shift. No hydrocephalus or extra-axial fluid collection. Vascular: No abnormal hyperdense vessel. Skull: Visualized scalp soft tissues and calvarium within normal limits. Sinuses/Orbits: Globes and orbital soft tissues within normal limits. Paranasal sinuses and mastoid air cells are largely clear. Other: None. Review of the MIP images confirms the above findings CTA NECK FINDINGS Aortic arch: Standard branching. Imaged portion shows no evidence of aneurysm or dissection. No significant stenosis of the major arch vessel origins. Right carotid system: No evidence of dissection, stenosis (50% or greater), or occlusion. Left carotid system: No evidence of dissection, stenosis (50% or greater), or occlusion. Vertebral arteries: No evidence of dissection, stenosis (50% or greater), or occlusion. Skeleton: No discrete or worrisome osseous lesions. Other neck: No other acute finding. Upper chest: No other acute finding. Review of the MIP images confirms the above findings CTA HEAD FINDINGS Anterior circulation: Both internal carotid arteries are patent to the siphons without stenosis or other abnormality. A1 segments patent bilaterally. Left A1 dominant. Normal anterior communicating artery complex. Both ACAs patent to their distal aspects without stenosis. No M1 stenosis or occlusion. No proximal MCA branch occlusion or high-grade stenosis. Distal MCA branches perfused and symmetric. Posterior circulation: Both V4 segments patent without stenosis. Left vertebral artery dominant. Left PICA patent. Right PICA not seen. Basilar patent without stenosis. Superior cerebellar and posterior cerebral arteries patent bilaterally. Venous sinuses: Patent allowing for timing the contrast bolus. Anatomic variants: As  above.  No aneurysm. Review of the MIP images confirms the above findings IMPRESSION: 1. Normal CTA of the head and neck. No large vessel occlusion or other emergent finding. No hemodynamically significant or correctable stenosis. 2. No other acute intracranial abnormality. Electronically Signed   By: Morene Hoard M.D.   On: 03/05/2024 01:55   CT HEAD WO CONTRAST Result Date: 03/04/2024 EXAM: CT HEAD WITHOUT CONTRAST 03/04/2024 08:42:00 PM TECHNIQUE: CT of the head was performed without the administration of intravenous contrast. Automated exposure control, iterative reconstruction, and/or weight based adjustment of the mA/kV was utilized to reduce the radiation dose to as low as reasonably achievable. COMPARISON: None available. CLINICAL HISTORY: Transient ischemic attack (TIA). FINDINGS: BRAIN AND VENTRICLES: No acute hemorrhage. No evidence of acute infarct. No hydrocephalus. No extra-axial collection. No mass effect or midline shift. ORBITS: No acute abnormality. SINUSES: No acute abnormality. SOFT TISSUES AND SKULL: No acute soft tissue abnormality. No skull fracture. IMPRESSION: 1. No acute intracranial abnormality. Electronically signed by: Norman Gatlin MD 03/04/2024 10:14 PM EST RP Workstation: [id number]       LOS: 0 days   Joette Pebbles  [facility] Pager on [email or web address]  03/05/2024,  8:41 AM   "

## 2024-03-05 NOTE — Progress Notes (Signed)
 STROKE TEAM PROGRESS NOTE   SUBJECTIVE (INTERVAL HISTORY) No family is at the bedside.  Overall her condition is completely resolved.  She stated that overnight she is to have on and off right facial numbness but now resolved.  She had A-fib in 2020 status post ablation.  She follows with Dr. Waddell.  She is not having palpitation for A-fib anymore however her Apple Watch still saying she had A-fib at least once a day.   OBJECTIVE Temp:  [97.6 F (36.4 C)-98.5 F (36.9 C)] 98.5 F (36.9 C) (01/31 1603) Pulse Rate:  [70-91] 71 (01/31 1603) Cardiac Rhythm: Normal sinus rhythm (01/30 2320) Resp:  [16-23] 18 (01/31 1603) BP: (96-135)/(71-89) 114/71 (01/31 1603) SpO2:  [96 %-100 %] 99 % (01/31 1603) Weight:  [84.8 kg] 84.8 kg (01/30 2130)  Recent Labs  Lab 03/04/24 2056 03/05/24 1600  GLUCAP 160* 89   Recent Labs  Lab 03/02/24 1046 03/04/24 2020 03/04/24 2039 03/05/24 0049  NA 136 138 140 137  K 4.5 3.9 3.8 3.9  CL 100 102 101 101  CO2 22 28  --  25  GLUCOSE 103* 195* 164* 115*  BUN 18 15 15 14   CREATININE 0.89 0.79 0.80 0.76  CALCIUM  9.1 9.0  --  9.0   Recent Labs  Lab 03/02/24 1046 03/04/24 2020 03/05/24 0049  AST 13 22 21   ALT 14 15 15   ALKPHOS 70 77 79  BILITOT 0.8 0.5 0.5  PROT 7.9 8.0 7.9  ALBUMIN 4.2 4.2 4.2   Recent Labs  Lab 03/02/24 1046 03/04/24 2020 03/04/24 2039 03/05/24 0049  WBC 10.5 11.3*  --  12.4*  NEUTROABS 7.0 7.9*  --  8.1*  HGB 14.1 12.9 13.9 13.4  HCT 43.3 39.6 41.0 40.8  MCV 85 84.8  --  84.3  PLT 234 199  --  211   No results for input(s): CKTOTAL, CKMB, CKMBINDEX, TROPONINI in the last 168 hours. Recent Labs    03/04/24 2020  LABPROT 13.9  INR 1.0   No results for input(s): COLORURINE, LABSPEC, PHURINE, GLUCOSEU, HGBUR, BILIRUBINUR, KETONESUR, PROTEINUR, UROBILINOGEN, NITRITE, LEUKOCYTESUR in the last 72 hours.  Invalid input(s): APPERANCEUR     Component Value Date/Time   CHOL 95  03/05/2024 0049   CHOL 100 03/02/2024 1046   TRIG 94 03/05/2024 0049   HDL 34 (L) 03/05/2024 0049   HDL 33 (L) 03/02/2024 1046   CHOLHDL 2.8 03/05/2024 0049   VLDL 19 03/05/2024 0049   LDLCALC 42 03/05/2024 0049   LDLCALC 51 03/02/2024 1046   Lab Results  Component Value Date   HGBA1C 7.2 (H) 03/05/2024   No results found for: LABOPIA, COCAINSCRNUR, LABBENZ, AMPHETMU, THCU, LABBARB  Recent Labs  Lab 03/04/24 2020  ETH <15    I have personally reviewed the radiological images below and agree with the radiology interpretations.  ECHOCARDIOGRAM COMPLETE Result Date: 03/05/2024    ECHOCARDIOGRAM REPORT   Patient Name:   Jessica Carey Date of Exam: 03/05/2024 Medical Rec #:  [id number]    Height:       64.0 in Accession #:    [id number]   Weight:       186.9 lb Date of Birth:  [birth year]   BSA:          1.901 m Patient Age:    39 years     BP:           96/83 mmHg Patient Gender: F  HR:           70 bpm. Exam Location:  Inpatient Procedure: 2D Echo, Cardiac Doppler, Color Doppler and Intracardiac            Opacification Agent (Both Spectral and Color Flow Doppler were            utilized during procedure). Indications:    TIA G45.9  History:        Patient has prior history of Echocardiogram examinations, most                 recent 03/18/2018. TIA, Arrythmias:Hx of AFIB; Risk                 Factors:Diabetes and Dyslipidemia.  Sonographer:    Koleen Popper RDCS Referring Phys: ALI MALIK  Sonographer Comments: Image acquisition challenging due to patient body habitus. IMPRESSIONS  1. Left ventricular ejection fraction, by estimation, is 60 to 65%. The left ventricle has normal function. The left ventricle has no regional wall motion abnormalities. Left ventricular diastolic parameters were normal.  2. Right ventricular systolic function is normal. The right ventricular size is normal. There is normal pulmonary artery systolic pressure.  3. The mitral valve is normal in  structure. No evidence of mitral valve regurgitation. No evidence of mitral stenosis.  4. The aortic valve is normal in structure. Aortic valve regurgitation is not visualized. No aortic stenosis is present.  5. The inferior vena cava is normal in size with greater than 50% respiratory variability, suggesting right atrial pressure of 3 mmHg. FINDINGS  Left Ventricle: Left ventricular ejection fraction, by estimation, is 60 to 65%. The left ventricle has normal function. The left ventricle has no regional wall motion abnormalities. Definity  contrast agent was given IV to delineate the left ventricular  endocardial borders. The left ventricular internal cavity size was normal in size. There is no left ventricular hypertrophy. Left ventricular diastolic parameters were normal. Right Ventricle: The right ventricular size is normal. No increase in right ventricular wall thickness. Right ventricular systolic function is normal. There is normal pulmonary artery systolic pressure. The tricuspid regurgitant velocity is 2.35 m/s, and  with an assumed right atrial pressure of 8 mmHg, the estimated right ventricular systolic pressure is 30.1 mmHg. Left Atrium: Left atrial size was normal in size. Right Atrium: Right atrial size was normal in size. Pericardium: There is no evidence of pericardial effusion. Mitral Valve: The mitral valve is normal in structure. No evidence of mitral valve regurgitation. No evidence of mitral valve stenosis. Tricuspid Valve: The tricuspid valve is normal in structure. Tricuspid valve regurgitation is not demonstrated. No evidence of tricuspid stenosis. Aortic Valve: The aortic valve is normal in structure. Aortic valve regurgitation is not visualized. No aortic stenosis is present. Pulmonic Valve: The pulmonic valve was normal in structure. Pulmonic valve regurgitation is not visualized. No evidence of pulmonic stenosis. Aorta: The aortic root and ascending aorta are structurally normal, with no  evidence of dilitation. Venous: The inferior vena cava is normal in size with greater than 50% respiratory variability, suggesting right atrial pressure of 3 mmHg. IAS/Shunts: No atrial level shunt detected by color flow Doppler.  LEFT VENTRICLE PLAX 2D LVIDd:         5.40 cm      Diastology LVIDs:         3.40 cm      LV e' medial:    9.90 cm/s LV PW:         0.80 cm  LV E/e' medial:  8.6 LV IVS:        0.90 cm      LV e' lateral:   16.50 cm/s LVOT diam:     1.90 cm      LV E/e' lateral: 5.2 LV SV:         65 LV SV Index:   34 LVOT Area:     2.84 cm  LV Volumes (MOD) LV vol d, MOD A4C: 134.0 ml LV vol s, MOD A4C: 39.1 ml LV SV MOD A4C:     134.0 ml RIGHT VENTRICLE             IVC RV Basal diam:  3.80 cm     IVC diam: 2.10 cm RV S prime:     12.70 cm/s TAPSE (M-mode): 2.4 cm LEFT ATRIUM             Index        RIGHT ATRIUM           Index LA diam:        4.00 cm 2.10 cm/m   RA Area:     13.80 cm LA Vol (A2C):   35.6 ml 18.73 ml/m  RA Volume:   30.20 ml  15.89 ml/m LA Vol (A4C):   48.7 ml 25.62 ml/m LA Biplane Vol: 45.8 ml 24.09 ml/m  AORTIC VALVE LVOT Vmax:   111.00 cm/s LVOT Vmean:  70.200 cm/s LVOT VTI:    0.229 m  AORTA Ao Root diam: 2.90 cm Ao Asc diam:  3.20 cm MITRAL VALVE               TRICUSPID VALVE MV Area (PHT): 3.85 cm    TR Peak grad:   22.1 mmHg MV Decel Time: 197 msec    TR Vmax:        235.00 cm/s MV E velocity: 85.10 cm/s MV A velocity: 42.70 cm/s  SHUNTS MV E/A ratio:  1.99        Systemic VTI:  0.23 m                            Systemic Diam: 1.90 cm Kardie Tobb DO Electronically signed by Dub Huntsman DO Signature Date/Time: 03/05/2024/1:09:38 PM    Final    MR BRAIN WO CONTRAST Result Date: 03/05/2024 CLINICAL DATA:  Initial evaluation for acute neuro deficit, stroke suspected. EXAM: MRI HEAD WITHOUT CONTRAST TECHNIQUE: Multiplanar, multiecho pulse sequences of the brain and surrounding structures were obtained without intravenous contrast. COMPARISON:  Prior CT from earlier the  same day. FINDINGS: Brain: Cerebral volume within normal limits. No significant cerebral white matter disease. Subtle focus of mild diffusion signal abnormality involving the subcortical left frontal lobe, consistent with a small acute ischemic infarct (series 5, images 81, 80). Small focus of associated petechial hemorrhage at this location (series 12, image 41). Additional 8 mm acute ischemic left cerebellar infarct (series 5, image 59). No associated hemorrhage at this location. Otherwise, gray-white matter differentiation maintained. No areas of chronic cortical infarction. No other acute or chronic intracranial blood products. No mass lesion, midline shift or mass effect. No hydrocephalus or extra-axial fluid collection. Pituitary gland within normal limits. Vascular: Major intracranial vascular flow voids are maintained. Skull and upper cervical spine: Cranial junction within normal limits. Decreased T1 signal intensity within the visualized bone marrow, nonspecific, but most commonly related to anemia, smoking or obesity. No scalp soft tissue abnormality. Sinuses/Orbits: Globes and  orbital soft tissues within normal limits. Paranasal sinuses are clear. No significant mastoid effusion. Other: None. IMPRESSION: 1. Small acute ischemic infarct involving the subcortical left frontal lobe. Associated small focus of petechial hemorrhage at this location. 2. Additional 8 mm acute ischemic nonhemorrhagic left cerebellar infarct. 3. Otherwise unremarkable brain MRI. Electronically Signed   By: Morene Hoard M.D.   On: 03/05/2024 05:14   CT ANGIO HEAD NECK W WO CM Result Date: 03/05/2024 CLINICAL DATA:  Initial evaluation for acute numbness, dizziness, stroke/TIA. EXAM: CT ANGIOGRAPHY HEAD AND NECK WITH AND WITHOUT CONTRAST TECHNIQUE: Multidetector CT imaging of the head and neck was performed using the standard protocol during bolus administration of intravenous contrast. Multiplanar CT image reconstructions  and MIPs were obtained to evaluate the vascular anatomy. Carotid stenosis measurements (when applicable) are obtained utilizing NASCET criteria, using the distal internal carotid diameter as the denominator. RADIATION DOSE REDUCTION: This exam was performed according to the departmental dose-optimization program which includes automated exposure control, adjustment of the mA and/or kV according to patient size and/or use of iterative reconstruction technique. CONTRAST:  80mL OMNIPAQUE  IOHEXOL  350 MG/ML SOLN COMPARISON:  Comparison made with from 03/04/2024. FINDINGS: CT HEAD FINDINGS Brain: A noncontrast head CT was repeated due to technical air. Images are limited as the skull vertex is incompletely visualized. Visualized portions of the brain are within normal limits. No acute intracranial hemorrhage. No acute large vessel territory infarct. No mass lesion or midline shift. No hydrocephalus or extra-axial fluid collection. Vascular: No abnormal hyperdense vessel. Skull: Visualized scalp soft tissues and calvarium within normal limits. Sinuses/Orbits: Globes and orbital soft tissues within normal limits. Paranasal sinuses and mastoid air cells are largely clear. Other: None. Review of the MIP images confirms the above findings CTA NECK FINDINGS Aortic arch: Standard branching. Imaged portion shows no evidence of aneurysm or dissection. No significant stenosis of the major arch vessel origins. Right carotid system: No evidence of dissection, stenosis (50% or greater), or occlusion. Left carotid system: No evidence of dissection, stenosis (50% or greater), or occlusion. Vertebral arteries: No evidence of dissection, stenosis (50% or greater), or occlusion. Skeleton: No discrete or worrisome osseous lesions. Other neck: No other acute finding. Upper chest: No other acute finding. Review of the MIP images confirms the above findings CTA HEAD FINDINGS Anterior circulation: Both internal carotid arteries are patent to  the siphons without stenosis or other abnormality. A1 segments patent bilaterally. Left A1 dominant. Normal anterior communicating artery complex. Both ACAs patent to their distal aspects without stenosis. No M1 stenosis or occlusion. No proximal MCA branch occlusion or high-grade stenosis. Distal MCA branches perfused and symmetric. Posterior circulation: Both V4 segments patent without stenosis. Left vertebral artery dominant. Left PICA patent. Right PICA not seen. Basilar patent without stenosis. Superior cerebellar and posterior cerebral arteries patent bilaterally. Venous sinuses: Patent allowing for timing the contrast bolus. Anatomic variants: As above.  No aneurysm. Review of the MIP images confirms the above findings IMPRESSION: 1. Normal CTA of the head and neck. No large vessel occlusion or other emergent finding. No hemodynamically significant or correctable stenosis. 2. No other acute intracranial abnormality. Electronically Signed   By: Morene Hoard M.D.   On: 03/05/2024 01:55   CT HEAD WO CONTRAST Result Date: 03/04/2024 EXAM: CT HEAD WITHOUT CONTRAST 03/04/2024 08:42:00 PM TECHNIQUE: CT of the head was performed without the administration of intravenous contrast. Automated exposure control, iterative reconstruction, and/or weight based adjustment of the mA/kV was utilized to reduce the radiation dose  to as low as reasonably achievable. COMPARISON: None available. CLINICAL HISTORY: Transient ischemic attack (TIA). FINDINGS: BRAIN AND VENTRICLES: No acute hemorrhage. No evidence of acute infarct. No hydrocephalus. No extra-axial collection. No mass effect or midline shift. ORBITS: No acute abnormality. SINUSES: No acute abnormality. SOFT TISSUES AND SKULL: No acute soft tissue abnormality. No skull fracture. IMPRESSION: 1. No acute intracranial abnormality. Electronically signed by: Norman Gatlin MD 03/04/2024 10:14 PM EST RP Workstation: [id number]     PHYSICAL EXAM  Temp:  [97.6 F  (36.4 C)-98.5 F (36.9 C)] 98.5 F (36.9 C) (01/31 1603) Pulse Rate:  [70-91] 71 (01/31 1603) Resp:  [16-23] 18 (01/31 1603) BP: (96-135)/(71-89) 114/71 (01/31 1603) SpO2:  [96 %-100 %] 99 % (01/31 1603) Weight:  [84.8 kg] 84.8 kg (01/30 2130)  General - Well nourished, well developed, in no apparent distress.  Ophthalmologic - fundi not visualized due to noncooperation.  Cardiovascular - Regular rhythm and rate.  Mental Status -  Level of arousal and orientation to time, place, and person were intact. Language including expression, naming, repetition, comprehension was assessed and found intact. Attention span and concentration were normal. Recent and remote memory were intact. Fund of Knowledge was assessed and was intact.  Cranial Nerves II - XII - II - Visual field intact OU. III, IV, VI - Extraocular movements intact. V - Facial sensation intact bilaterally. VII - Facial movement intact bilaterally. VIII - Hearing & vestibular intact bilaterally. X - Palate elevates symmetrically. XI - Chin turning & shoulder shrug intact bilaterally. XII - Tongue protrusion intact.  Motor Strength - The patients strength was normal in all extremities and pronator drift was absent.  Bulk was normal and fasciculations were absent.   Motor Tone - Muscle tone was assessed at the neck and appendages and was normal.  Reflexes - The patients reflexes were symmetrical in all extremities and she had no pathological reflexes.  Sensory - Light touch, temperature/pinprick were assessed and were symmetrical.    Coordination - The patient had normal movements in the hands and feet with no ataxia or dysmetria.  Tremor was absent.  Gait and Station - deferred.   ASSESSMENT/PLAN Ms. Cordell Coke is a 39 y.o. female with history of hyperlipidemia, diabetes, A-fib, SVT status post ablation admitted for right facial numbness, slurred speech, tongue numbness and off-balance. No TNK given due to  symptom resolved.    Stroke:  left frontal and left cerebellum small infarct, embolic likely secondary to A-fib not on AC CT no acute abnormality CT head and neck unremarkable MRI Small acute ischemic infarct involving the subcortical left frontal lobe.  Associated with small focus of petechial hemorrhage at this location.  Additional 8 mm acute ischemic nonhemorrhagic left cerebellar infarct 2D Echo EF 60 to 65% LDL 42 HgbA1c 7.2 UDS pending Eliquis  for VTE prophylaxis No antithrombotic prior to admission, now on Eliquis  (apixaban ) daily for stroke prevention. Patient counseled to be compliant with her antithrombotic medications Ongoing aggressive stroke risk factor management Therapy recommendations: None Disposition: Home today  PAF and SVT s/p ablation Found to have A-fib in 2019 during pregnancy Had A-fib RVR and SVT status post ablation in 03/2018. CHA2DS2-VASc score was 2 at the time Follows with Dr. Waddell cardiology as outpatient, no [facility] started Patient reported no more palpitation but her Apple Watch kept warning her for A-fib almost every day Now given embolic stroke, put on Eliquis  5 mg twice daily Need close outpatient cardiology follow-up  Diabetes HgbA1c 7.2 goal < 7.0  Controlled CBG monitoring SSI DM education and close PCP follow up for better DM control  Hyperlipidemia Home meds: Crestor  10 LDL 42, goal < 70 Now on Crestor  10 No high intensity statin given LDL at goal with lower dose statin Continue statin at discharge  Other Stroke Risk Factors Obesity, Body mass index is 32.09 kg/m.   Other Active Problems Mild leukocytosis, WBC 10.5--11.3--12.4  Hospital day # 0  Neurology will sign off. Please call with questions. Pt will follow up with stroke clinic NP at [facility] in about 4 weeks. Thanks for the consult.   Ary Cummins, MD PhD Stroke Neurology 03/05/2024 4:53 PM    To contact Stroke Continuity provider, please refer to [email or web address]. After hours,  contact General Neurology

## 2024-03-05 NOTE — ED Notes (Signed)
 Patient returned from MRI.

## 2024-03-05 NOTE — ED Notes (Signed)
 Patient transported to MRI

## 2024-03-05 NOTE — Progress Notes (Signed)
 PHARMACY - ANTICOAGULATION CONSULT NOTE  Pharmacy Consult for Eliquis  Indication: atrial fibrillation and stroke  Allergies[1]  Patient Measurements: Height: 5' 4 (162.6 cm) Weight: 84.8 kg (186 lb 15.2 oz) IBW/kg (Calculated) : 54.7 HEPARIN  DW (KG): 73.3  Vital Signs: Temp: 98.3 F (36.8 C) (01/31 1206) Temp Source: Oral (01/31 1206) BP: 111/83 (01/31 1206) Pulse Rate: 70 (01/31 1206)  Labs: Recent Labs    03/04/24 2020 03/04/24 2039 03/05/24 0049  HGB 12.9 13.9 13.4  HCT 39.6 41.0 40.8  PLT 199  --  211  APTT 26  --   --   LABPROT 13.9  --   --   INR 1.0  --   --   CREATININE 0.79 0.80 0.76    Estimated Creatinine Clearance: 101.4 mL/min (by C-G formula based on SCr of 0.76 mg/dL).   Medical History: Past Medical History:  Diagnosis Date   ASCUS of cervix with negative high risk HPV 12/24/2020   12/24/20 repeat pap in 3 years per [facility],5 year risk CIN3+ is 0.40%   Boil, breast 07/04/2013   Diabetes (HCC) 08/04/2013   Diabetes mellitus without complication (HCC)    glyburide  and insulin    Dysrhythmia    a-fib   Hyperlipidemia    Irregular periods     Medications:  Medications Prior to Admission  Medication Sig Dispense Refill Last Dose/Taking   glimepiride  (AMARYL ) 4 MG tablet Take 1 tablet (4 mg total) by mouth daily before breakfast. 90 tablet 1 03/04/2024 Morning   rosuvastatin  (CRESTOR ) 10 MG tablet Take 1 tablet (10 mg total) by mouth daily. 90 tablet 1 03/04/2024 Morning   tirzepatide  (MOUNJARO ) 15 MG/0.5ML Pen Inject 15 mg into the skin once a week. (Patient taking differently: Inject 15 mg into the skin every Sunday.) 6 mL 1 02/28/2024   blood glucose meter kit and supplies Dispense based on patient and insurance preference. Use up to four times daily as directed. (FOR ICD-10 E10.9, E11.9). 1 each 0    glucose blood (ONETOUCH VERIO) test strip USE AS DIRECTED 4 TIMES DAILY TO CHECK GLUCOSE 100 each 0    Insulin  Pen Needle (BD PEN NEEDLE NANO U/F)  32G X 4 MM MISC Use with Ozempic  100 each 5    Lancets (ONETOUCH DELICA PLUS LANCET33G) MISC as directed 100 each 0     Assessment: 39 yo F who presented with stroke-like symptoms. Pt has a known history of afib; not on anticoagulation. Pharmacy consulted to dose eliquis . CBC stable.  Goal of Therapy:  Monitor platelets by anticoagulation protocol: Yes   Plan:  Start Apixaban  5 mg PO BID Monitor for s/s of hemorrhage and renal function Educate on use prior to discharge  Tramane Gorum, PharmD PGY1 Clinical Pharmacist [facility]  03/05/2024 2:43 PM       [1] No Known Allergies

## 2024-03-07 ENCOUNTER — Telehealth: Payer: Self-pay

## 2024-03-10 ENCOUNTER — Observation Stay (HOSPITAL_COMMUNITY)
Admission: EM | Admit: 2024-03-10 | Source: Home / Self Care | Attending: Emergency Medicine | Admitting: Emergency Medicine

## 2024-03-10 ENCOUNTER — Other Ambulatory Visit: Payer: Self-pay

## 2024-03-10 ENCOUNTER — Emergency Department (HOSPITAL_COMMUNITY)

## 2024-03-10 ENCOUNTER — Encounter (HOSPITAL_COMMUNITY): Payer: Self-pay

## 2024-03-10 DIAGNOSIS — I482 Chronic atrial fibrillation, unspecified: Secondary | ICD-10-CM

## 2024-03-10 DIAGNOSIS — E1169 Type 2 diabetes mellitus with other specified complication: Secondary | ICD-10-CM

## 2024-03-10 DIAGNOSIS — I639 Cerebral infarction, unspecified: Principal | ICD-10-CM

## 2024-03-10 DIAGNOSIS — R29898 Other symptoms and signs involving the musculoskeletal system: Secondary | ICD-10-CM | POA: Diagnosis not present

## 2024-03-10 DIAGNOSIS — E66811 Obesity, class 1: Secondary | ICD-10-CM | POA: Diagnosis not present

## 2024-03-10 DIAGNOSIS — R531 Weakness: Secondary | ICD-10-CM

## 2024-03-10 LAB — COMPREHENSIVE METABOLIC PANEL WITH GFR
ALT: 17 U/L (ref 0–44)
AST: 23 U/L (ref 15–41)
Albumin: 4.4 g/dL (ref 3.5–5.0)
Alkaline Phosphatase: 73 U/L (ref 38–126)
Anion gap: 14 (ref 5–15)
BUN: 20 mg/dL (ref 6–20)
CO2: 25 mmol/L (ref 22–32)
Calcium: 9.2 mg/dL (ref 8.9–10.3)
Chloride: 100 mmol/L (ref 98–111)
Creatinine, Ser: 0.83 mg/dL (ref 0.44–1.00)
GFR, Estimated: >60 mL/min
Glucose, Bld: 99 mg/dL (ref 70–99)
Potassium: 3.9 mmol/L (ref 3.5–5.1)
Sodium: 138 mmol/L (ref 135–145)
Total Bilirubin: 0.4 mg/dL (ref 0.0–1.2)
Total Protein: 8.4 g/dL — ABNORMAL HIGH (ref 6.5–8.1)

## 2024-03-10 LAB — DIFFERENTIAL
Abs Immature Granulocytes: 0.03 10*3/uL (ref 0.00–0.07)
Basophils Absolute: 0.1 10*3/uL (ref 0.0–0.1)
Basophils Relative: 1 %
Eosinophils Absolute: 0.2 10*3/uL (ref 0.0–0.5)
Eosinophils Relative: 1 %
Immature Granulocytes: 0 %
Lymphocytes Relative: 43 %
Lymphs Abs: 4.9 10*3/uL — ABNORMAL HIGH (ref 0.7–4.0)
Monocytes Absolute: 0.7 10*3/uL (ref 0.1–1.0)
Monocytes Relative: 6 %
Neutro Abs: 5.5 10*3/uL (ref 1.7–7.7)
Neutrophils Relative %: 49 %

## 2024-03-10 LAB — CBC
HCT: 43.1 % (ref 36.0–46.0)
Hemoglobin: 14.1 g/dL (ref 12.0–15.0)
MCH: 27.6 pg (ref 26.0–34.0)
MCHC: 32.7 g/dL (ref 30.0–36.0)
MCV: 84.5 fL (ref 80.0–100.0)
Platelets: 255 10*3/uL (ref 150–400)
RBC: 5.1 MIL/uL (ref 3.87–5.11)
RDW: 13.5 % (ref 11.5–15.5)
WBC: 11.2 10*3/uL — ABNORMAL HIGH (ref 4.0–10.5)
nRBC: 0 % (ref 0.0–0.2)

## 2024-03-10 LAB — APTT: aPTT: 30 s (ref 24–36)

## 2024-03-10 LAB — CBG MONITORING, ED: Glucose-Capillary: 105 mg/dL — ABNORMAL HIGH (ref 70–99)

## 2024-03-10 LAB — PROTIME-INR
INR: 1.1 (ref 0.8–1.2)
Prothrombin Time: 14.3 s (ref 11.4–15.2)

## 2024-03-10 LAB — ETHANOL: Alcohol, Ethyl (B): <15 mg/dL

## 2024-03-10 LAB — POC URINE PREG, ED: Preg Test, Ur: NEGATIVE

## 2024-03-10 MED ORDER — IOHEXOL 350 MG/ML SOLN
75.0000 mL | Freq: Once | INTRAVENOUS | Status: AC | PRN
  Administered 2024-03-10: 75 mL via INTRAVENOUS

## 2024-03-10 NOTE — H&P (Incomplete)
 " History and Physical    Patient: Jessica Carey FMW:[id number] DOB: Oct 11, 1985 DOA: 03/10/2024 DOS: the patient was seen and examined on 03/10/2024 PCP: Edman Meade PEDLAR, FNP  Patient coming from: Home  Chief Complaint:  Chief Complaint  Patient presents with   Extremity Weakness   HPI: Jessica Carey is a 39 y.o. female with medical history significant of hyperlipidemia, type 2 diabetes mellitus, atrial fibrillation on Eliquis , recent diagnosis of mild ischemic stroke who presents to the emergency department due to an abrupt onset of mild left-sided weakness which started around 9:30 PM today.  Last dose of Eliquis  was this evening prior to arrival to the ED.  ED course In the emergency department, she was hemodynamically stable.  Workup in the ED showed normal CBC except for WBC of 11.2.  BMP was normal.  Alcohol level was less than 15. CT of head without contrast showed no acute intracranial abnormality CTA head and neck without contrast showed no LVO. Teleneurology was consulted and recommended MRI brain without contrast with further workup with TEE and hypercoagulable workup if MRI is positive for recurrent stroke.  TRH was asked to admit patient   Review of Systems: As mentioned in the history of present illness. All other systems reviewed and are negative. Past Medical History:  Diagnosis Date   ASCUS of cervix with negative high risk HPV 12/24/2020   12/24/20 repeat pap in 3 years per [facility],5 year risk CIN3+ is 0.40%   Boil, breast 07/04/2013   Diabetes (HCC) 08/04/2013   Diabetes mellitus without complication (HCC)    glyburide  and insulin    Dysrhythmia    a-fib   Hyperlipidemia    Irregular periods    Past Surgical History:  Procedure Laterality Date   BREAST SURGERY Right    breast abcess   ELECTROPHYSIOLOGY STUDY N/A 03/22/2018   Procedure: ELECTROPHYSIOLOGY STUDY;  Surgeon: Waddell Danelle ORN, MD;  Location: [facility];  Service: Cardiovascular;  Laterality: N/A;    PERINEAL LACERATION REPAIR N/A 03/17/2018   Procedure: SUTURE REPAIR PERINEAL LACERATION;  Surgeon: Herchel Gloris LABOR, MD;  Location: [facility];  Service: Gynecology;  Laterality: N/A;   SURGERY OF LIP     SVT ABLATION N/A 03/22/2018   Procedure: SVT ABLATION;  Surgeon: Waddell Danelle ORN, MD;  Location: [facility];  Service: Cardiovascular;  Laterality: N/A;   Social History:  reports that she has never smoked. She has never used smokeless tobacco. She reports that she does not currently use alcohol. She reports that she does not use drugs.  Allergies[1]  Family History  Problem Relation Age of Onset   Diabetes Mother    Diabetes Father    Hyperlipidemia Father    Colon polyps Father 42   Diabetes Sister    Diabetes Paternal Grandmother    Cancer Maternal Aunt        liver    Prior to Admission medications  Medication Sig Start Date End Date Taking? Authorizing Provider  apixaban  (ELIQUIS ) 5 MG TABS tablet Take 1 tablet (5 mg total) by mouth 2 (two) times daily. 03/05/24   Krishnan, Gokul, MD  blood glucose meter kit and supplies Dispense based on patient and insurance preference. Use up to four times daily as directed. (FOR ICD-10 E10.9, E11.9). 03/14/22   Bacchus, Meade PEDLAR, FNP  glimepiride  (AMARYL ) 4 MG tablet Take 1 tablet (4 mg total) by mouth daily before breakfast. 03/02/24   Gladis, Mary-Margaret, FNP  glucose blood (ONETOUCH VERIO) test strip  USE AS DIRECTED 4 TIMES DAILY TO CHECK GLUCOSE 09/01/23   Bacchus, Meade PEDLAR, FNP  Insulin  Pen Needle (BD PEN NEEDLE NANO U/F) 32G X 4 MM MISC Use with Ozempic  02/02/23   Bacchus, Gloria Z, FNP  Lancets [facility] DELICA PLUS [place]) MISC as directed 02/02/23   Bacchus, Meade PEDLAR, FNP  rosuvastatin  (CRESTOR ) 10 MG tablet Take 1 tablet (10 mg total) by mouth daily. 03/02/24   Gladis, Mary-Margaret, FNP  tirzepatide  (MOUNJARO ) 15 MG/0.5ML Pen Inject 15 mg into the skin once a week. Patient taking differently: Inject 15 mg into  the skin every Sunday. 03/02/24   Gladis Mustard, FNP    Physical Exam: Vitals:   03/10/24 2213 03/10/24 2247 03/10/24 2250 03/10/24 2317  BP:  126/86  (!) 120/92  Pulse:  84 86   Resp:  18 17 19   Temp:      TempSrc:      SpO2:  100% 100%   Weight: 85.5 kg      General: Awake and alert and oriented x3. Not in any acute distress.  HEENT: NCAT.  PERRLA. EOMI. Sclerae anicteric.  Moist mucosal membranes. Neck: Neck supple without lymphadenopathy. No carotid bruits. No masses palpated.  Cardiovascular: Regular rate with normal S1-S2 sounds. No murmurs, rubs or gallops auscultated. No JVD.  Respiratory: Clear breath sounds.  No accessory muscle use. Abdomen: Soft, nontender, nondistended. Active bowel sounds. No masses or hepatosplenomegaly  Skin: No rashes, lesions, or ulcerations.  Dry, warm to touch. Musculoskeletal:  2+ dorsalis pedis and radial pulses. Good ROM.  No contractures  Psychiatric: Intact judgment and insight.  Mood appropriate to current condition. Neurologic: No focal neurological deficits. Strength is 5/5 x 4.  CN II - XII grossly intact.   Assessment and Plan: Left-sided weakness, rule out acute recurrent CVA  Obesity class I (BMI 32.36) Diet and lifestyle modification   Advance Care Planning:   Code Status: Prior ***  Consults: ***  Family Communication: ***  Severity of Illness: {Observation/Inpatient:21159}  Author: Posey Maier, DO 03/10/2024 11:27 PM  For on call review [email or web address].      [1] No Known Allergies  "

## 2024-03-10 NOTE — ED Provider Notes (Incomplete)
 " [place] EMERGENCY DEPARTMENT AT [facility] Provider Note   CSN: [id number] Arrival date & time: 03/10/24  2158  An emergency department physician performed an initial assessment on this suspected stroke patient at 2220.  Patient presents with: Extremity Weakness   Jessica Carey is a 39 y.o. female.  {Add pertinent medical, surgical, social history, OB history to YEP:67052}  Extremity Weakness  Patient presents with left sided weakness.  Went to see patient urgently since did have recent stroke.  Last normal at 930 tonight.  Did have recent admission to the hospital last week for ischemic stroke.  Did have some petechial hemorrhage.  Thought to be due to her A-fib.  Now started on A-fib.  Has had previous A-fib ablation.  At 930 began to feel somewhat dizzy and weak on her left side.  Some difficulty walking.  Was to left arm but to a greater extent left leg.  States she has felt a little dizzy since starting the Eliquis .  Took her nighttime dose just prior to getting here.  Past Medical History:  Diagnosis Date   ASCUS of cervix with negative high risk HPV 12/24/2020   12/24/20 repeat pap in 3 years per [facility],5 year risk CIN3+ is 0.40%   Boil, breast 07/04/2013   Diabetes (HCC) 08/04/2013   Diabetes mellitus without complication (HCC)    glyburide  and insulin    Dysrhythmia    a-fib   Hyperlipidemia    Irregular periods    Past Surgical History:  Procedure Laterality Date   BREAST SURGERY Right    breast abcess   ELECTROPHYSIOLOGY STUDY N/A 03/22/2018   Procedure: ELECTROPHYSIOLOGY STUDY;  Surgeon: Waddell Danelle ORN, MD;  Location: [facility];  Service: Cardiovascular;  Laterality: N/A;   PERINEAL LACERATION REPAIR N/A 03/17/2018   Procedure: SUTURE REPAIR PERINEAL LACERATION;  Surgeon: Herchel Gloris LABOR, MD;  Location: [facility];  Service: Gynecology;  Laterality: N/A;   SURGERY OF LIP     SVT ABLATION N/A 03/22/2018   Procedure: SVT ABLATION;   Surgeon: Waddell Danelle ORN, MD;  Location: [facility];  Service: Cardiovascular;  Laterality: N/A;        Prior to Admission medications  Medication Sig Start Date End Date Taking? Authorizing Provider  apixaban  (ELIQUIS ) 5 MG TABS tablet Take 1 tablet (5 mg total) by mouth 2 (two) times daily. 03/05/24   Krishnan, Gokul, MD  blood glucose meter kit and supplies Dispense based on patient and insurance preference. Use up to four times daily as directed. (FOR ICD-10 E10.9, E11.9). 03/14/22   Bacchus, Meade PEDLAR, FNP  glimepiride  (AMARYL ) 4 MG tablet Take 1 tablet (4 mg total) by mouth daily before breakfast. 03/02/24   Gladis, Mary-Margaret, FNP  glucose blood (ONETOUCH VERIO) test strip USE AS DIRECTED 4 TIMES DAILY TO CHECK GLUCOSE 09/01/23   Bacchus, Meade PEDLAR, FNP  Insulin  Pen Needle (BD PEN NEEDLE NANO U/F) 32G X 4 MM MISC Use with Ozempic  02/02/23   Bacchus, Meade PEDLAR, FNP  Lancets (ONETOUCH DELICA PLUS [place]) MISC as directed 02/02/23   Bacchus, Meade PEDLAR, FNP  rosuvastatin  (CRESTOR ) 10 MG tablet Take 1 tablet (10 mg total) by mouth daily. 03/02/24   Gladis Mary-Margaret, FNP  tirzepatide  (MOUNJARO ) 15 MG/0.5ML Pen Inject 15 mg into the skin once a week. Patient taking differently: Inject 15 mg into the skin every Sunday. 03/02/24   Gladis Mustard, FNP    Allergies: Patient has no known allergies.    Review  of Systems  Musculoskeletal:  Positive for extremity weakness.    Updated Vital Signs BP 126/86   Pulse 86   Temp 98.4 F (36.9 C) (Oral)   Resp 17   Wt 85.5 kg   SpO2 100%   BMI 32.36 kg/m   Physical Exam Vitals and nursing note reviewed.  HENT:     Head: Atraumatic.  Eyes:     Extraocular Movements: Extraocular movements intact.     Pupils: Pupils are equal, round, and reactive to light.  Cardiovascular:     Rate and Rhythm: Normal rate. Rhythm irregular.  Skin:    Capillary Refill: Capillary refill takes less than 2 seconds.  Neurological:     Mental  Status: She is alert.     Comments: Face symmetric.  Eye movement intact.  No visual field cut.  Finger-nose intact bilaterally.  Heel-to-shin intact bilaterally.  Can hold each leg up for 10 seconds long with each arm.  However left leg does appear not quite as strong compared to the right.  Stood patient up and has more difficulty controlling the left with walking.  May havehad a foot drop.       (all labs ordered are listed, but only abnormal results are displayed) Labs Reviewed  CBC - Abnormal; Notable for the following components:      Result Value   WBC 11.2 (*)    All other components within normal limits  DIFFERENTIAL - Abnormal; Notable for the following components:   Lymphs Abs 4.9 (*)    All other components within normal limits  CBG MONITORING, ED - Abnormal; Notable for the following components:   Glucose-Capillary 105 (*)    All other components within normal limits  COMPREHENSIVE METABOLIC PANEL WITH GFR  ETHANOL  URINE DRUG SCREEN  PROTIME-INR  APTT  I-STAT CHEM 8, ED  POC URINE PREG, ED    EKG: EKG Interpretation Date/Time:  Thursday March 10 2024 22:15:14 EST Ventricular Rate:  104 PR Interval:    QRS Duration:  94 QT Interval:  338 QTC Calculation: 423 R Axis:   96  Text Interpretation: Atrial fibrillation Borderline right axis deviation Confirmed by Patsey Lot [phone]) on 03/10/2024 10:21:06 PM  Radiology: CT HEAD CODE STROKE WO CONTRAST (LKW 0-4.5h, LVO 0-24h) Result Date: 03/10/2024 EXAM: CT HEAD WITHOUT CONTRAST 03/10/2024 10:23:53 PM TECHNIQUE: CT of the head was performed without the administration of intravenous contrast. Automated exposure control, iterative reconstruction, and/or weight based adjustment of the mA/kV was utilized to reduce the radiation dose to as low as reasonably achievable. COMPARISON: None available. CLINICAL HISTORY: Neuro deficit, acute, stroke suspected. Acute neurologic deficit; stroke suspected. FINDINGS: BRAIN AND  VENTRICLES: No acute hemorrhage. No evidence of acute infarct. The [place] Stroke Program Early CT Score (ASPECTS) is 10/10, with 7/7 points for the ganglionic regions (caudate, internal capsule, lentiform nucleus, insula, M1-M3) and 3/3 points for the supraganglionic regions (M4-M6). No hydrocephalus. No extra-axial collection. No mass effect or midline shift. SINUSES: No acute abnormality. SOFT TISSUES AND SKULL: No acute soft tissue abnormality. No skull fracture. IMPRESSION: 1. No acute intracranial abnormality. 2. ASPECTS of 10. Findings communicated to Dr. Patsey at 10:31 PM on 03/10/2024 Electronically signed by: Franky Stanford MD 03/10/2024 10:32 PM EST RP Workstation: [id number]    {Document cardiac monitor, telemetry assessment procedure when appropriate:32947} Procedures   Medications Ordered in the ED  iohexol  (OMNIPAQUE ) 350 MG/ML injection 75 mL (75 mLs Intravenous Contrast Given 03/10/24 2239)      {Click here  for ABCD2, HEART and other calculators REFRESH Note before signing:1}                              Medical Decision Making Amount and/or Complexity of Data Reviewed Labs: ordered. Radiology: ordered.  Risk Prescription drug management.   Patient presented with some neurologic deficits.  Left leg weakness.  Mild deficits overall.  However with recent stroke and deficits with acute onset code stroke was called.  Taken emergently to head CT.  Discussed with teleneurologist.  Reviewed note from recent discharge.  Reviewed MRI and CT imaging.  Initial head CT by my interpretation negative for bleed.  No clear evidence of stroke seen.  CRITICAL CARE Performed by: [place] Total critical care time: 30 minutes Critical care time was exclusive of separately billable procedures and treating other patients. Critical care was necessary to treat or prevent imminent or life-threatening deterioration. Critical care was time spent personally by me on the following activities:  development of treatment plan with patient and/or surrogate as well as nursing, discussions with consultants, evaluation of patient's response to treatment, examination of patient, obtaining history from patient or surrogate, ordering and performing treatments and interventions, ordering and review of laboratory studies, ordering and review of radiographic studies, pulse oximetry and re-evaluation of patient's condition.  Neurologist NIH score was 2 with mild drift on the upper and lower extremity.  Not a TNK candidate.   Recommends admission and MRI.  If had another stroke despite being on Eliquis  may need further workup such as TEE or hypercoagulable workup.  Care will be turned over to Dr. Midge.    {Document critical care time when appropriate  Document review of labs and clinical decision tools ie CHADS2VASC2, etc  Document your independent review of radiology images and any outside records  Document your discussion with family members, caretakers and with consultants  Document social determinants of health affecting pt's care  Document your decision making why or why not admission, treatments were needed:32947:::1}   Final diagnoses:  Weakness    ED Discharge Orders     None        "

## 2024-03-10 NOTE — ED Notes (Signed)
 CODE STROKE PAGED 2220

## 2024-03-10 NOTE — Consult Note (Signed)
 [facility] Consult Services   Patient Name:   Jessica Carey, Jessica Carey Date of Birth:   July 09, 1985 Identification Number:   MRN - [id number] Date of Service:   03/10/2024 22:23:52  Diagnosis:       I63.89 - Cerebrovascular accident (CVA) due to other mechanism (HCCC)  Impression:      39 year old woman with history of hyperlipidemia, type 2 diabetes, atrial fibrillation, diagnosis of mild ischemic strokes last week felt to be cardioembolic from A-fib and since started on Eliquis  5 mg twice daily, now presenting to the hospital with abrupt onset of mild left-sided weakness, onset of symptoms at 9:30 PM. At the time of my assessment, NIH stroke scale is a 2 (mild drift of the left arm and left leg). Noncontrast head CT did not show acute abnormalities. She is not a thrombolytic candidate since on Eliquis , last dose taken this evening, also had stroke within the past 3 months. CTA head/neck without critical vascular findings or LVO on my review.    Clinical concern at this time is for recurrent stroke. Recommend admission for monitoring and further evaluation.  Our recommendations are outlined below.  Recommendations:        Stroke/Telemetry Floor       Neuro Checks (Q4)       Bedside Swallow Eval       DVT Prophylaxis       IV Fluids, Normal Saline       Euglycemia and Avoid Hyperthermia (PRN Acetaminophen )       Antihypertensives PRN if Blood pressure is greater than 220/120 or there is a concern for End organ damage/contraindications for permissive HTN. If blood pressure is greater than 220/120 give labetalol  PO or IV or Vasotec IV with a goal of 15% reduction in BP during the first 24 hours.       -continue Eliquis  for now (low suspicion for large volume stroke that would be at risk of hemorrhagic conversion)       -MRI brain w/o contrast       --> if recurrent stroke, consider further work-up with TEE and hypercoagulable work-up  Sign Out:       Discussed  with Emergency Department Provider    ------------------------------------------------------------------------------  Advanced Imaging: LVO:No  Patient is not a candidate for NIR   Metrics: Last Known Well: 03/10/2024 21:30:00 Arrival Time: 03/10/2024 21:58:00 Activation Time: 03/10/2024 22:23:52 Initial Response Time: 03/10/2024 22:25:23 Symptoms: left-sided weakness. Initial patient interaction: 03/10/2024 22:31:06 NIHSS Assessment Completed: 03/10/2024 22:37:17 Patient is not a candidate for Thrombolytic. Thrombolytic Medical Decision: 03/10/2024 22:41:15 Patient was not deemed candidate for Thrombolytic because of following reasons: Use of NOAC in last 48 hrs. . Significant head trauma or stroke in previous 3 months .  CT Head: I personally reviewed all the CT images that were available to me and it showed: no acute intracranial abnormalities  Primary Provider Notified of Diagnostic Impression and Management Plan on: 03/10/2024 22:51:29    ------------------------------------------------------------------------------  History of Present Illness: Patient is a 39 year old Female.  Patient was brought by private transportation with symptoms of left-sided weakness. Patient is able to provide history. She was in her usual state of health up until 9:30 PM, at which time she was walking and felt as if she pulled a muscle in her left leg, followed by left leg weakness, she also became nauseous and vomited, and also felt very lightheaded. She had to sit down, subsequently taken to hospital for further evaluation. She has history pertinent for  acute ischemic stroke diagnosed one week ago, at which time she presented with right sided facial numbness, slurred speech and disequilibrium. Her symptoms had resolved, although MRI of the brain ended up showing small, acute strokes in the left subcortical frontal lobe as well as left cerebellar hemisphere. Vascular imaging at the time was  unremarkable. TTE with EF 60 to 65%. Patient was initiated on Eliquis  5 mg twice daily, presumptively had cardioembolic strokes from afib. Since discharge, she reports being compliant on Eliquis  5 mg twice daily, she last took the dose around 930 this evening, right after her symptoms started.    Past Medical History:      Diabetes Mellitus      Hyperlipidemia      Atrial Fibrillation      Stroke  Medications:  Anticoagulant use:  Yes Eliquis  No Antiplatelet use Reviewed EMR for current medications  Allergies:  Reviewed  Social History: Drug Use: No  Family History:  There is no family history of premature cerebrovascular disease pertinent to this consultation  ROS : 14 Points Review of Systems was performed and was negative except mentioned in HPI.  Past Surgical History: There Is No Surgical History Contributory To Todays Visit     Examination: BP(124/100), Pulse(85), 1A: Level of Consciousness - Alert; keenly responsive + 0 1B: Ask Month and Age - Both Questions Right + 0 1C: Blink Eyes & Squeeze Hands - Performs Both Tasks + 0 2: Test Horizontal Extraocular Movements - Normal + 0 3: Test Visual Fields - No Visual Loss + 0 4: Test Facial Palsy (Use Grimace if Obtunded) - Normal symmetry + 0 5A: Test Left Arm Motor Drift - Drift, but doesn't hit bed + 1 5B: Test Right Arm Motor Drift - No Drift for 10 Seconds + 0 6A: Test Left Leg Motor Drift - Drift, but doesn't hit bed + 1 6B: Test Right Leg Motor Drift - No Drift for 5 Seconds + 0 7: Test Limb Ataxia (FNF/Heel-Shin) - No Ataxia + 0 8: Test Sensation - Normal; No sensory loss + 0 9: Test Language/Aphasia - Normal; No aphasia + 0 10: Test Dysarthria - Normal + 0 11: Test Extinction/Inattention - No abnormality + 0  NIHSS Score: 2   Pre-Morbid Modified Rankin Scale: 0 Points = No symptoms at all  Spoke with : Dr. Patsey, over Epic secure chat I reviewed the available imaging via Rapid and initiated  discussion with the primary provider  This consult was conducted in real time using interactive audio and immunologist. Patient was informed of the technology being used for this visit and agreed to proceed. Patient located in hospital and provider located at home/office setting.   Patient is being evaluated for possible acute neurologic impairment and high probability of imminent or life-threatening deterioration. I spent total of 28 minutes providing care to this patient, including time for face to face visit via telemedicine, review of medical records, imaging studies and discussion of findings with providers, the patient and/or family.    Dr Adine Sage   [facility] For Inpatient follow-up with [facility] physician please call [facility] at [phone]. As we are not an outpatient service for any post hospital discharge needs please contact the hospital for assistance. If you have any questions for the [facility] physicians or need to reconsult for clinical or diagnostic changes please contact us  via [facility] at [phone].  Non-radiologist review of imaging performed to assist with emergent clinical decision-making. Remote physician workstations do not possess the same  resolution, calibration, or diagnostic capabilities as hospital-based radiology reading stations, and formal radiologist read is necessary.   Signature : Adine Sage

## 2024-03-10 NOTE — ED Triage Notes (Signed)
 Pt reports she had a stoke on 1/30 and had an episode of left sided weakness to the arm and leg just prior to arrival.

## 2024-03-11 ENCOUNTER — Other Ambulatory Visit (HOSPITAL_COMMUNITY): Payer: Self-pay | Admitting: *Deleted

## 2024-03-11 ENCOUNTER — Observation Stay (HOSPITAL_COMMUNITY)

## 2024-03-11 LAB — LIPID PANEL
Cholesterol: 106 mg/dL (ref 0–200)
HDL: 33 mg/dL — ABNORMAL LOW
LDL Cholesterol: 56 mg/dL (ref 0–99)
Total CHOL/HDL Ratio: 3.2 ratio
Triglycerides: 83 mg/dL
VLDL: 17 mg/dL (ref 0–40)

## 2024-03-11 LAB — ECHOCARDIOGRAM COMPLETE BUBBLE STUDY
Area-P 1/2: 4.21 cm2
S' Lateral: 2.9 cm

## 2024-03-11 LAB — URINE DRUG SCREEN
Amphetamines: NEGATIVE
Barbiturates: NEGATIVE
Benzodiazepines: NEGATIVE
Cocaine: NEGATIVE
Fentanyl: NEGATIVE
Methadone Scn, Ur: NEGATIVE
Opiates: NEGATIVE
Tetrahydrocannabinol: NEGATIVE

## 2024-03-11 LAB — CBG MONITORING, ED
Glucose-Capillary: 108 mg/dL — ABNORMAL HIGH (ref 70–99)
Glucose-Capillary: 101 mg/dL — ABNORMAL HIGH (ref 70–99)
Glucose-Capillary: 111 mg/dL — ABNORMAL HIGH (ref 70–99)
Glucose-Capillary: 101 mg/dL — ABNORMAL HIGH (ref 70–99)

## 2024-03-11 LAB — GLUCOSE, CAPILLARY
Glucose-Capillary: 196 mg/dL — ABNORMAL HIGH (ref 70–99)
Glucose-Capillary: 84 mg/dL (ref 70–99)

## 2024-03-11 MED ORDER — ROSUVASTATIN CALCIUM 10 MG PO TABS
10.0000 mg | ORAL_TABLET | Freq: Every day | ORAL | Status: AC
  Administered 2024-03-11: 10 mg via ORAL
  Filled 2024-03-11: qty 1

## 2024-03-11 MED ORDER — METOPROLOL TARTRATE 25 MG PO TABS
25.0000 mg | ORAL_TABLET | Freq: Every day | ORAL | Status: AC
  Administered 2024-03-11: 25 mg via ORAL
  Filled 2024-03-11: qty 1

## 2024-03-11 MED ORDER — STROKE: EARLY STAGES OF RECOVERY BOOK
Freq: Once | Status: AC
  Filled 2024-03-11: qty 1

## 2024-03-11 MED ORDER — INSULIN ASPART 100 UNIT/ML IJ SOLN
0.0000 [IU] | INTRAMUSCULAR | Status: AC
  Administered 2024-03-11: 3 [IU] via SUBCUTANEOUS
  Filled 2024-03-11: qty 1

## 2024-03-11 MED ORDER — APIXABAN 5 MG PO TABS
5.0000 mg | ORAL_TABLET | Freq: Two times a day (BID) | ORAL | Status: AC
  Administered 2024-03-11 (×2): 5 mg via ORAL
  Filled 2024-03-11 (×2): qty 1

## 2024-03-11 MED ORDER — PERFLUTREN LIPID MICROSPHERE
1.0000 mL | INTRAVENOUS | Status: AC | PRN
  Administered 2024-03-11: 3 mL via INTRAVENOUS

## 2024-03-11 NOTE — ED Notes (Signed)
 Pt states that she had an ablation about 6 years ago for a-fib. Pt states that she has not had an issue with her heart rate until today. Pt states that she has had multiple episodes today where her heart rate would spike and she would get a hot flash and feel drunk. Pt states that she noticed the monitor tonight spike to 113 and she had the same feeling. KCM~

## 2024-03-11 NOTE — Evaluation (Signed)
 Occupational Therapy Evaluation Patient Details Name: Jessica Carey MRN: [id number] DOB: [birth year] Today's Date: 03/11/2024   History of Present Illness   Jessica Carey is a 39 y.o. female with medical history significant of hyperlipidemia, type 2 diabetes mellitus, atrial fibrillation on Eliquis , recent diagnosis of mild ischemic stroke who presents to the emergency department due to an abrupt onset of left leg weakness and numbness which started around 9:30 PM today.  She denied left arm weakness.  Last dose of Eliquis  was this evening prior to arrival to the ED. (per MD)     Clinical Impressions Pt agreeable to OT and PT co-evaluation. Pt appears to be at or near baseline for functional ADL's and mobility. Pt reports feeling back to normal. Pt did not require any physical assist today. WFL B UE strength and coordination noted today. No visual deficits observed today. Pt is not recommended for any further acute OT services and will be discharged to care of nursing staff for remaining length of stay.                Functional Status Assessment   Patient has not had a recent decline in their functional status     Equipment Recommendations   None recommended by OT             Precautions/Restrictions   Precautions Precautions: None Recall of Precautions/Restrictions: Intact Restrictions Weight Bearing Restrictions Per Provider Order: No     Mobility Bed Mobility Overal bed mobility: Independent                  Transfers Overall transfer level: Independent Equipment used: None                      Balance Overall balance assessment: Independent                                         ADL either performed or assessed with clinical judgement   ADL Overall ADL's : Independent                                             Vision Baseline Vision/History: 0 No visual deficits Ability to See in Adequate  Light: 0 Adequate Patient Visual Report: No change from baseline Vision Assessment?: No apparent visual deficits     Perception Perception: Not tested       Praxis Praxis: Not tested       Pertinent Vitals/Pain Pain Assessment Pain Assessment: No/denies pain     Extremity/Trunk Assessment Upper Extremity Assessment Upper Extremity Assessment: Overall WFL for tasks assessed (Limited by R IV)   Lower Extremity Assessment Lower Extremity Assessment: Defer to PT evaluation   Cervical / Trunk Assessment Cervical / Trunk Assessment: Normal   Communication Communication Communication: No apparent difficulties   Cognition Arousal: Alert Behavior During Therapy: WFL for tasks assessed/performed Cognition: No apparent impairments                               Following commands: Intact       Cueing  General Comments   Cueing Techniques: Verbal cues  Home Living Family/patient expects to be discharged to:: Private residence Living Arrangements: Other relatives (sister; mother helping as well) Available Help at Discharge: Family;Available 24 hours/day Type of Home: Mobile home Home Access: Stairs to enter Entrance Stairs-Number of Steps: 3 Entrance Stairs-Rails: Right;Left;Can reach both Home Layout: One level     Bathroom Shower/Tub: Chief Strategy Officer: Standard Bathroom Accessibility: Yes   Home Equipment: None          Prior Functioning/Environment Prior Level of Function : Independent/Modified Independent;Working/employed;Driving             Mobility Comments: Works, drives ADLs Comments: independent                            Co-evaluation PT/OT/SLP Co-Evaluation/Treatment: Yes Reason for Co-Treatment: To address functional/ADL transfers   OT goals addressed during session: ADL's and self-care      AM-PAC OT 6 Clicks Daily Activity     Outcome Measure Help from another person  eating meals?: None Help from another person taking care of personal grooming?: None Help from another person toileting, which includes using toliet, bedpan, or urinal?: None Help from another person bathing (including washing, rinsing, drying)?: None Help from another person to put on and taking off regular upper body clothing?: None Help from another person to put on and taking off regular lower body clothing?: None 6 Click Score: 24   End of Session    Activity Tolerance: Patient tolerated treatment well Patient left: in bed;with call bell/phone within reach  OT Visit Diagnosis: Other symptoms and signs involving the nervous system (R29.898)                Time: 9199-9189 OT Time Calculation (min): 10 min Charges:  OT General Charges $OT Visit: 1 Visit OT Evaluation $OT Eval Low Complexity: 1 Low  Nikea Settle OT, MOT  Jayson Person 03/11/2024, 9:58 AM

## 2024-03-11 NOTE — Consult Note (Signed)
 I connected with  Jessica Carey on 03/11/24 by a video enabled telemedicine application and verified that I am speaking with the correct person using two identifiers.   I discussed the limitations of evaluation and management by telemedicine. The patient expressed understanding and agreed to proceed.  Location of patient: [facility] Location of physician: [facility]   Neurology Consultation Reason for Consult: Stroke Referring Physician: Dr. Posey Maier  CC: Left leg paresthesias  History is obtained from: Patient, chart review  HPI: Jessica Carey is a 39 y.o. female with hypertension, hyperlipidemia, atrial fibrillation on Eliquis , recent strokes who presented with transient left leg paresthesias.  States she was sitting watching TV and when she stood up she noticed some paresthesias in her left leg which lasted for less than 20 minutes and has since completely resolved.  Reports compliance with Eliquis .  Last known normal: 03/10/2024 at 9:30 PM No tPA as symptoms resolved No thrombectomy as no large vessel occlusion Event happened at home mRS 0   ROS: All other systems reviewed and negative except as noted in the HPI.   Past Medical History:  Diagnosis Date   ASCUS of cervix with negative high risk HPV 12/24/2020   12/24/20 repeat pap in 3 years per [facility],5 year risk CIN3+ is 0.40%   Boil, breast 07/04/2013   Diabetes (HCC) 08/04/2013   Diabetes mellitus without complication (HCC)    glyburide  and insulin    Dysrhythmia    a-fib   Hyperlipidemia    Irregular periods     Family History  Problem Relation Age of Onset   Diabetes Mother    Diabetes Father    Hyperlipidemia Father    Colon polyps Father 50   Diabetes Sister    Diabetes Paternal Grandmother    Cancer Maternal Aunt        liver    Social History:  reports that she has never smoked. She has never used smokeless tobacco. She reports that she does not currently use alcohol. She reports  that she does not use drugs.   Exam: Current vital signs: BP 101/80   Pulse 87   Temp 98.4 F (36.9 C) (Oral)   Resp (!) 21   Wt 85.5 kg   SpO2 100%   BMI 32.36 kg/m  Vital signs in last 24 hours: Temp:  [98.4 F (36.9 C)] 98.4 F (36.9 C) (02/06 0909) Pulse Rate:  [74-96] 87 (02/06 1008) Resp:  [14-24] 21 (02/06 1008) BP: (90-126)/(70-100) 101/80 (02/06 1008) SpO2:  [96 %-100 %] 100 % (02/06 1008) FiO2 (%):  [21 %] 21 % (02/06 0059) Weight:  [85.5 kg] 85.5 kg (02/06 0134)   Physical Exam  Constitutional: Appears well-developed and well-nourished.  Psych: Affect appropriate to situation Neuro: AO x 3, cranial nerves grossly intact, no aphasia or dysarthria, antigravity strength in all 4 extremities without drift, sensory intact to light touch, FTN intact bilaterally  NIHSS 0  I have reviewed labs in epic and the results pertinent to this consultation are: CBC:  Recent Labs  Lab 03/05/24 0049 03/10/24 2221  WBC 12.4* 11.2*  NEUTROABS 8.1* 5.5  HGB 13.4 14.1  HCT 40.8 43.1  MCV 84.3 84.5  PLT 211 255    Basic Metabolic Panel:  Lab Results  Component Value Date   NA 138 03/10/2024   K 3.9 03/10/2024   CO2 25 03/10/2024   GLUCOSE 99 03/10/2024   BUN 20 03/10/2024   CREATININE 0.83 03/10/2024   CALCIUM  9.2 03/10/2024  GFRNONAA >60 03/10/2024   GFRAA >60 03/22/2018   Lipid Panel:  Lab Results  Component Value Date   LDLCALC 56 03/11/2024   HgbA1c:  Lab Results  Component Value Date   HGBA1C 7.2 (H) 03/05/2024   Urine Drug Screen:     Component Value Date/Time   LABOPIA NEGATIVE 03/10/2024 2333   COCAINSCRNUR NEGATIVE 03/10/2024 2333   LABBENZ NEGATIVE 03/10/2024 2333   AMPHETMU NEGATIVE 03/10/2024 2333   THCU NEGATIVE 03/10/2024 2333   LABBARB NEGATIVE 03/10/2024 2333    Alcohol Level     Component Value Date/Time   ETH <15 03/10/2024 2221     I have reviewed the images obtained:  CT Head without contrast 03/10/2024: No acute  intracranial abnormality. ASPECTS of 10.  CT angio Head and Neck with contrast 03/10/2024: No large vessel occlusion, hemodynamically significant stenosis, or aneurysm in the head or neck.    MRI Brain wo contrast  03/11/2024: Punctate recurrent small acute infarct in the left superior perirolandic cortex. Resolving small left frontal and left cerebellar infarcts. No hemorrhage or mass effect.      ASSESSMENT/PLAN: 39 year old female with atrial fibrillation on Eliquis  presented with transient left leg numbness.  MRI brain shows left perirolandic infarct.  Acute/subacute ischemic stroke - Patient's symptoms do not correlate with the stroke seen on MRI.  I was able to review her previous MRI scan.  This current stroke is probably related to her symptoms that she was admitted for last week - Patient symptoms this time were brief and most likely related to TIA  Recommendations: -TTE ordered and pending.  If negative, no further workup from neurology standpoint - Okay to continue Eliquis  - Continue rosuvastatin  10 mg daily, LDL 56 - Goal blood pressure normotension -Continue to follow-up with neurology - Rest of the management per primary team - Discussed plan with hospitalist via secure chat  Thank you for allowing us  to participate in the care of this patient. If you have any further questions, please contact  me or neurohospitalist.   Arlin Krebs Epile[facility] neurohospitalist

## 2024-03-11 NOTE — Progress Notes (Signed)
" °  Inpatient Care Management (ICM) has reviewed patient and no other ICM needs have been identified at this time. We will continue to monitor patient advancement through interdisciplinary progression rounds. If new patient transition needs arise, please place a ICM consult.   03/11/24 1025  TOC Brief Assessment  Insurance and Status Reviewed  Patient has primary care physician Yes  Home environment has been reviewed From Home  Prior level of function: Independent  Prior/Current Home Services No current home services  Social Drivers of Health Review SDOH reviewed interventions complete  Readmission risk has been reviewed Yes  Transition of care needs no transition of care needs at this time    "

## 2024-03-11 NOTE — Evaluation (Signed)
 Physical Therapy Evaluation Patient Details Name: Jessica Carey MRN: [id number] DOB: 03-16-1985 Today's Date: 03/11/2024  History of Present Illness  Jessica Carey is a 39 y.o. female with medical history significant of hyperlipidemia, type 2 diabetes mellitus, atrial fibrillation on Eliquis , recent diagnosis of mild ischemic stroke who presents to the emergency department due to an abrupt onset of left leg weakness and numbness which started around 9:30 PM today.  She denied left arm weakness.  Last dose of Eliquis  was this evening prior to arrival to the ED. (per MD)   Clinical Impression  Patient functioning at baseline for functional mobility and gait demonstrating good return for ambulating in room, hallways without loss of balance or need for an AD. Plan:  Patient discharged from physical therapy to care of nursing for ambulation daily as tolerated for length of stay.          If plan is discharge home, recommend the following: Help with stairs or ramp for entrance   Can travel by private vehicle        Equipment Recommendations None recommended by PT  Recommendations for Other Services       Functional Status Assessment Patient has had a recent decline in their functional status and demonstrates the ability to make significant improvements in function in a reasonable and predictable amount of time.     Precautions / Restrictions Precautions Precautions: None Recall of Precautions/Restrictions: Intact Restrictions Weight Bearing Restrictions Per Provider Order: No      Mobility  Bed Mobility Overal bed mobility: Modified Independent                  Transfers Overall transfer level: Modified independent Equipment used: None                    Ambulation/Gait Ambulation/Gait assistance: Independent Gait Distance (Feet): 120 Feet Assistive device: None Gait Pattern/deviations: WFL(Within Functional Limits) Gait velocity: normal     General Gait  Details: Patient grossly WLF with good return for ambulation in room, hallways without loss of balance or need for an AD  Stairs            Wheelchair Mobility     Tilt Bed    Modified Rankin (Stroke Patients Only)       Balance Overall balance assessment: Independent                                           Pertinent Vitals/Pain Pain Assessment Pain Assessment: No/denies pain    Home Living Family/patient expects to be discharged to:: Private residence Living Arrangements: Other relatives (sister; mother helping as well) Available Help at Discharge: Family;Available 24 hours/day Type of Home: Mobile home Home Access: Stairs to enter Entrance Stairs-Rails: Right;Left;Can reach both Entrance Stairs-Number of Steps: 3   Home Layout: One level Home Equipment: None      Prior Function Prior Level of Function : Independent/Modified Independent;Working/employed;Driving             Mobility Comments: Works, drives ADLs Comments: independent     Extremity/Trunk Assessment   Upper Extremity Assessment Upper Extremity Assessment: Defer to OT evaluation    Lower Extremity Assessment Lower Extremity Assessment: Overall WFL for tasks assessed    Cervical / Trunk Assessment Cervical / Trunk Assessment: Normal  Communication   Communication Communication: No apparent difficulties    Cognition Arousal: Alert Behavior  During Therapy: WFL for tasks assessed/performed                             Following commands: Intact       Cueing Cueing Techniques: Verbal cues     General Comments      Exercises     Assessment/Plan    PT Assessment Patient does not need any further PT services  PT Problem List         PT Treatment Interventions      PT Goals (Current goals can be found in the Care Plan section)  Acute Rehab PT Goals Patient Stated Goal: return home with family to assist PT Goal Formulation: With  patient Time For Goal Achievement: 03/11/24 Potential to Achieve Goals: Good    Frequency       Co-evaluation PT/OT/SLP Co-Evaluation/Treatment: Yes Reason for Co-Treatment: To address functional/ADL transfers PT goals addressed during session: Mobility/safety with mobility;Balance OT goals addressed during session: ADL's and self-care       AM-PAC PT 6 Clicks Mobility  Outcome Measure Help needed turning from your back to your side while in a flat bed without using bedrails?: None Help needed moving from lying on your back to sitting on the side of a flat bed without using bedrails?: None Help needed moving to and from a bed to a chair (including a wheelchair)?: None Help needed standing up from a chair using your arms (e.g., wheelchair or bedside chair)?: None Help needed to walk in hospital room?: None Help needed climbing 3-5 steps with a railing? : None 6 Click Score: 24    End of Session   Activity Tolerance: Patient tolerated treatment well Patient left: in bed;with call bell/phone within reach Nurse Communication: Mobility status PT Visit Diagnosis: Unsteadiness on feet (R26.81);Other abnormalities of gait and mobility (R26.89);Muscle weakness (generalized) (M62.81)    Time: 9249-9189 PT Time Calculation (min) (ACUTE ONLY): 20 min   Charges:   PT Evaluation $PT Eval Moderate Complexity: 1 Mod PT Treatments $Therapeutic Activity: 8-22 mins PT General Charges $$ ACUTE PT VISIT: 1 Visit         1:40 PM, 03/11/24 Lynwood Music, MPT Physical Therapist with [facility] [phone] office [phone] mobile phone

## 2024-03-11 NOTE — Progress Notes (Signed)
 " PROGRESS NOTE    Jessica Carey  FMW:[id number] DOB: Sep 21, 1985 DOA: 03/10/2024 PCP: Edman Meade PEDLAR, FNP   Brief Narrative:   39 year old female with history of hyperlipidemia, type 2 diabetes, recent acute ischemic stroke 03/05/24 in the setting of paroxysmal atrial fibrillation(was not on anticoagulation at that time and initiated on Eliquis ) presents with transient left leg numbness.  Admitted for evaluation for stroke.  MRI brain shows left perirolandic infarct. Evaluated by neurology, patient's symptoms do not correlate with a stroke seen on MRI.  She probably had a TIA. Neurology recommends continuing Eliquis  which she has been taking since her recent stroke  stroke follow-up outpatient Echocardiogram has been ordered and is pending. Continue statin  History of atrial fibrillation: Patient reports history of A-fib, SVT status post ablation in the past Will continue Eliquis  Patient reports occasional palpitations.  She is not on rate controlling agents.  Will initiate metoprolol  25 daily Monitoring telemetry. Needs outpatient cardiology follow-up  Diabetes mellitus: Hemoglobin A1c 7.2 on 03/05/2024. On Mounjaro  and glimepiride  at home.  Will hold them Monitor blood sugar closely and cover with sliding scale novolog   Assessment & Plan:   Principal Problem:   Left-sided weakness   Subjective:  Patient seen and examined at the bedside earlier today.  Her left-sided symptoms has resolved.  She is back to her baseline.  She does report of occasional palpitation.  Vital signs stable today blood pressure is 90 systolic, heart rate occasionally above 100, A-fib/flutter.  Objective: Vitals:   03/11/24 1200 03/11/24 1230 03/11/24 1300 03/11/24 1353  BP: 95/76 (!) 82/65 107/83   Pulse: 86 84 93   Resp: (!) 25 15 (!) 29   Temp:    98.2 F (36.8 C)  TempSrc:    Oral  SpO2: 93% 98% 100%   Weight:        Intake/Output Summary (Last 24 hours) at 03/11/2024 1547 Last data filed  at 03/11/2024 0902 Gross per 24 hour  Intake 30 ml  Output --  Net 30 ml   Filed Weights   03/10/24 2213 03/11/24 0134  Weight: 85.5 kg 85.5 kg    Examination:  General exam: Appears calm and comfortable  Respiratory system: Bilateral decreased breath sounds at bases Cardiovascular system: S1 & S2 heard, irregular rhythm, rate controlled  gastrointestinal system: Abdomen is nondistended, soft and nontender. Normal bowel sounds heard. Extremities: No cyanosis, clubbing, edema  Central nervous system: Alert and oriented. No focal neurological deficits. Moving extremities Skin: No rashes, lesions or ulcers Psychiatry: Judgement and insight appear normal. Mood & affect appropriate.     Data Reviewed: I have personally reviewed following labs and imaging studies  CBC: Recent Labs  Lab 03/04/24 2020 03/04/24 2039 03/05/24 0049 03/10/24 2221  WBC 11.3*  --  12.4* 11.2*  NEUTROABS 7.9*  --  8.1* 5.5  HGB 12.9 13.9 13.4 14.1  HCT 39.6 41.0 40.8 43.1  MCV 84.8  --  84.3 84.5  PLT 199  --  211 255   Basic Metabolic Panel: Recent Labs  Lab 03/04/24 2020 03/04/24 2039 03/05/24 0049 03/10/24 2221  NA 138 140 137 138  K 3.9 3.8 3.9 3.9  CL 102 101 101 100  CO2 28  --  25 25  GLUCOSE 195* 164* 115* 99  BUN 15 15 14 20   CREATININE 0.79 0.80 0.76 0.83  CALCIUM  9.0  --  9.0 9.2   GFR: Estimated Creatinine Clearance: 98.2 mL/min (by C-G formula based on SCr of 0.83 mg/dL).  Liver Function Tests: Recent Labs  Lab 03/04/24 2020 03/05/24 0049 03/10/24 2221  AST 22 21 23   ALT 15 15 17   ALKPHOS 77 79 73  BILITOT 0.5 0.5 0.4  PROT 8.0 7.9 8.4*  ALBUMIN 4.2 4.2 4.4   No results for input(s): LIPASE, AMYLASE in the last 168 hours. No results for input(s): AMMONIA in the last 168 hours. Coagulation Profile: Recent Labs  Lab 03/04/24 2020 03/10/24 2319  INR 1.0 1.1   Cardiac Enzymes: No results for input(s): CKTOTAL, CKMB, CKMBINDEX, TROPONINI in the last  168 hours. BNP (last 3 results) No results for input(s): PROBNP in the last 8760 hours. HbA1C: No results for input(s): HGBA1C in the last 72 hours. CBG: Recent Labs  Lab 03/10/24 2214 03/11/24 0131 03/11/24 0401 03/11/24 0814 03/11/24 1115  GLUCAP 105* 108* 101* 111* 101*   Lipid Profile: Recent Labs    03/11/24 0459  CHOL 106  HDL 33*  LDLCALC 56  TRIG 83  CHOLHDL 3.2   Thyroid  Function Tests: No results for input(s): TSH, T4TOTAL, FREET4, T3FREE, THYROIDAB in the last 72 hours. Anemia Panel: No results for input(s): VITAMINB12, FOLATE, FERRITIN, TIBC, IRON , RETICCTPCT in the last 72 hours. Sepsis Labs: No results for input(s): PROCALCITON, LATICACIDVEN in the last 168 hours.  No results found for this or any previous visit (from the past 240 hours).       Radiology Studies: MR BRAIN WO CONTRAST Result Date: 03/11/2024 EXAM: MRI BRAIN WITHOUT CONTRAST 03/11/2024 07:42:00 AM TECHNIQUE: Multiplanar multisequence MRI of the head/brain was performed without the administration of intravenous contrast. COMPARISON: 03/05/2024, 03/10/2024 CLINICAL HISTORY: 39 year old female with history of atrial fibrillation on ileus and status post small embolic infarcts to the left cerebral and cerebellar hemispheres last month, now presenting with acute neurologic deficit; stroke suspected. FINDINGS: BRAIN AND VENTRICLES: Small new left superior perirolandic cortical infarct on series 5 image 42 with mild diffusion restriction. Subtle FLAIR hyperintensity associated with the new lesion on series 11 image 42. No hemorrhage or mass effect. Fading abnormal diffusion foci were in the left frontal lobe on series 5 image 34. Largely resolved abnormal diffusion in the left cerebellum (series 5 image 13). No other restricted diffusion. No chronic cerebral blood products on SWI. Elnor and white matter signal elsewhere remains normal. Maintained flow voids.no intracranial mass  effect or ventriculomegaly.negative pituitary and cervicomedullary junction. ORBITS: Visible internal auditory structures appear normal. SINUSES AND MASTOIDS: No significant abnormality. BONES AND SOFT TISSUES: Bone marrow signal within normal limits. Negative visible cervical spine. IMPRESSION: 1. Punctate recurrent small acute infarct in the left superior perirolandic cortex. 2. Resolving small left frontal and left cerebellar infarcts. 3. No hemorrhage or mass effect. Electronically signed by: Helayne Hurst MD 03/11/2024 07:55 AM EST RP Workstation: [id number]   CT ANGIO HEAD NECK W WO CM Result Date: 03/10/2024 EXAM: CTA HEAD AND NECK WITH AND WITHOUT 03/10/2024 10:43:44 PM TECHNIQUE: CTA of the head and neck was performed with and without the administration of intravenous contrast. Multiplanar 2D and/or 3D reformatted images are provided for review. Automated exposure control, iterative reconstruction, and/or weight based adjustment of the mA/kV was utilized to reduce the radiation dose to as low as reasonably achievable. Stenosis of the internal carotid arteries measured using NASCET criteria. COMPARISON: None available CLINICAL HISTORY: FINDINGS: AORTIC ARCH AND ARCH VESSELS: No dissection or arterial injury. No significant stenosis of the brachiocephalic or subclavian arteries. CERVICAL CAROTID ARTERIES: No dissection, arterial injury, or hemodynamically significant stenosis by NASCET criteria. CERVICAL  VERTEBRAL ARTERIES: No dissection, arterial injury, or significant stenosis. LUNGS AND MEDIASTINUM: Unremarkable. SOFT TISSUES: No acute abnormality. BONES: No acute abnormality. ANTERIOR CIRCULATION: No significant stenosis of the internal carotid arteries. No significant stenosis of the anterior cerebral arteries. No significant stenosis of the middle cerebral arteries. No aneurysm. POSTERIOR CIRCULATION: No significant stenosis of the posterior cerebral arteries. No significant stenosis of the basilar  artery. No significant stenosis of the vertebral arteries. No aneurysm. OTHER: No dural venous sinus thrombosis on this non-dedicated study. IMPRESSION: 1. No large vessel occlusion, hemodynamically significant stenosis, or aneurysm in the head or neck. Electronically signed by: Franky Stanford MD 03/10/2024 10:55 PM EST RP Workstation: [id number]   CT HEAD CODE STROKE WO CONTRAST (LKW 0-4.5h, LVO 0-24h) Result Date: 03/10/2024 EXAM: CT HEAD WITHOUT CONTRAST 03/10/2024 10:23:53 PM TECHNIQUE: CT of the head was performed without the administration of intravenous contrast. Automated exposure control, iterative reconstruction, and/or weight based adjustment of the mA/kV was utilized to reduce the radiation dose to as low as reasonably achievable. COMPARISON: None available. CLINICAL HISTORY: Neuro deficit, acute, stroke suspected. Acute neurologic deficit; stroke suspected. FINDINGS: BRAIN AND VENTRICLES: No acute hemorrhage. No evidence of acute infarct. The [place] Stroke Program Early CT Score (ASPECTS) is 10/10, with 7/7 points for the ganglionic regions (caudate, internal capsule, lentiform nucleus, insula, M1-M3) and 3/3 points for the supraganglionic regions (M4-M6). No hydrocephalus. No extra-axial collection. No mass effect or midline shift. SINUSES: No acute abnormality. SOFT TISSUES AND SKULL: No acute soft tissue abnormality. No skull fracture. IMPRESSION: 1. No acute intracranial abnormality. 2. ASPECTS of 10. Findings communicated to Dr. Patsey at 10:31 PM on 03/10/2024 Electronically signed by: Franky Stanford MD 03/10/2024 10:32 PM EST RP Workstation: [id number]        Scheduled Meds:  [START ON 03/12/2024]  stroke: early stages of recovery book   Does not apply Once   apixaban   5 mg Oral BID   insulin  aspart  0-15 Units Subcutaneous Q4H   metoprolol  tartrate  25 mg Oral Daily   rosuvastatin   10 mg Oral Daily   Continuous Infusions:        Graziella Connery, MD [facility] 03/11/2024, 3:47 PM   "

## 2024-03-11 NOTE — Progress Notes (Signed)
*  PRELIMINARY RESULTS* Echocardiogram 2D Echocardiogram has been performed.  Jessica Carey 03/11/2024, 4:13 PM

## 2024-03-21 ENCOUNTER — Inpatient Hospital Stay: Payer: Self-pay

## 2024-04-18 ENCOUNTER — Ambulatory Visit: Admitting: Adult Health

## 2024-08-31 ENCOUNTER — Ambulatory Visit: Payer: Self-pay | Admitting: Nurse Practitioner
